# Patient Record
Sex: Male | Born: 1940
Health system: Southern US, Community
[De-identification: ages and names within clinical notes are randomized; demographics above are authoritative.]

## PROBLEM LIST (undated history)

## (undated) ENCOUNTER — Ambulatory Visit (HOSPITAL_BASED_OUTPATIENT_CLINIC_OR_DEPARTMENT_OTHER): Admission: EM | Payer: Medicare Other | Source: Home / Self Care

## (undated) DIAGNOSIS — I4891 Unspecified atrial fibrillation: Secondary | ICD-10-CM

## (undated) DIAGNOSIS — I10 Essential (primary) hypertension: Secondary | ICD-10-CM

## (undated) DIAGNOSIS — E7800 Pure hypercholesterolemia, unspecified: Secondary | ICD-10-CM

## (undated) DIAGNOSIS — I7101 Dissection of thoracic aorta: Secondary | ICD-10-CM

## (undated) DIAGNOSIS — T148XXA Other injury of unspecified body region, initial encounter: Secondary | ICD-10-CM

## (undated) HISTORY — PX: OTHER SURGICAL HISTORY: SHX169

## (undated) HISTORY — PX: LUMBAR DISC SURGERY: SHX700

## (undated) HISTORY — DX: Unspecified atrial fibrillation: I48.91

## (undated) HISTORY — DX: Essential (primary) hypertension: I10

## (undated) HISTORY — PX: HAND SURGERY: SHX662

## (undated) HISTORY — PX: LASIK: SHX215

## (undated) HISTORY — DX: Pure hypercholesterolemia, unspecified: E78.00

## (undated) HISTORY — PX: BACK SURGERY: SHX140

## (undated) HISTORY — PX: CARDIOVERSION: SHX1299

---

## 1998-09-25 ENCOUNTER — Ambulatory Visit (HOSPITAL_COMMUNITY): Admission: RE | Admit: 1998-09-25 | Discharge: 1998-09-25 | Payer: Self-pay | Admitting: Neurosurgery

## 2003-10-15 ENCOUNTER — Ambulatory Visit (HOSPITAL_COMMUNITY): Admission: RE | Admit: 2003-10-15 | Discharge: 2003-10-15 | Payer: Self-pay | Admitting: Gastroenterology

## 2009-12-24 ENCOUNTER — Ambulatory Visit (HOSPITAL_COMMUNITY): Admission: RE | Admit: 2009-12-24 | Discharge: 2009-12-24 | Payer: Self-pay | Admitting: Cardiovascular Disease

## 2010-02-08 ENCOUNTER — Ambulatory Visit: Payer: Self-pay | Admitting: Cardiovascular Disease

## 2010-02-10 ENCOUNTER — Ambulatory Visit: Payer: Self-pay | Admitting: Cardiovascular Disease

## 2010-02-10 ENCOUNTER — Ambulatory Visit (HOSPITAL_COMMUNITY): Admission: RE | Admit: 2010-02-10 | Discharge: 2010-02-10 | Payer: Self-pay | Admitting: Cardiovascular Disease

## 2010-08-15 ENCOUNTER — Ambulatory Visit (INDEPENDENT_AMBULATORY_CARE_PROVIDER_SITE_OTHER): Payer: MEDICARE | Admitting: Cardiovascular Disease

## 2010-08-15 DIAGNOSIS — I119 Hypertensive heart disease without heart failure: Secondary | ICD-10-CM

## 2010-08-15 DIAGNOSIS — I4891 Unspecified atrial fibrillation: Secondary | ICD-10-CM

## 2010-09-08 ENCOUNTER — Encounter: Payer: Self-pay | Admitting: Cardiovascular Disease

## 2010-09-08 DIAGNOSIS — I4891 Unspecified atrial fibrillation: Secondary | ICD-10-CM | POA: Insufficient documentation

## 2010-09-08 DIAGNOSIS — I10 Essential (primary) hypertension: Secondary | ICD-10-CM | POA: Insufficient documentation

## 2010-09-08 DIAGNOSIS — E7800 Pure hypercholesterolemia, unspecified: Secondary | ICD-10-CM | POA: Insufficient documentation

## 2010-09-11 LAB — BASIC METABOLIC PANEL
BUN: 17 mg/dL (ref 6–23)
CO2: 23 mEq/L (ref 19–32)
Calcium: 9.4 mg/dL (ref 8.4–10.5)
Chloride: 106 mEq/L (ref 96–112)
Creatinine, Ser: 1.19 mg/dL (ref 0.4–1.5)
GFR calc Af Amer: >60 mL/min (ref >60)
GFR calc non Af Amer: >60 mL/min (ref >60)
Glucose, Bld: 91 mg/dL (ref 70–99)
Potassium: 4.4 mEq/L (ref 3.5–5.1)
Sodium: 137 mEq/L (ref 135–145)

## 2010-09-11 LAB — CBC
HCT: 47.3 % (ref 39.0–52.0)
Hemoglobin: 16.5 g/dL (ref 13.0–17.0)
MCH: 30.3 pg (ref 26.0–34.0)
MCHC: 34.8 g/dL (ref 30.0–36.0)
MCV: 86.9 fL (ref 78.0–100.0)
Platelets: 193 10*3/uL (ref 150–400)
RBC: 5.44 MIL/uL (ref 4.22–5.81)
RDW: 13.5 % (ref 11.5–15.5)
WBC: 4.6 10*3/uL (ref 4.0–10.5)

## 2010-09-15 ENCOUNTER — Ambulatory Visit (INDEPENDENT_AMBULATORY_CARE_PROVIDER_SITE_OTHER): Payer: BC Managed Care – PPO | Admitting: Cardiovascular Disease

## 2010-09-15 ENCOUNTER — Ambulatory Visit: Payer: Self-pay | Admitting: Cardiovascular Disease

## 2010-09-15 ENCOUNTER — Encounter: Payer: Self-pay | Admitting: Cardiovascular Disease

## 2010-09-15 VITALS — BP 126/96 | HR 54 | Wt 194.0 lb

## 2010-09-15 DIAGNOSIS — E7800 Pure hypercholesterolemia, unspecified: Secondary | ICD-10-CM

## 2010-09-15 DIAGNOSIS — I4891 Unspecified atrial fibrillation: Secondary | ICD-10-CM

## 2010-09-15 DIAGNOSIS — I10 Essential (primary) hypertension: Secondary | ICD-10-CM

## 2010-09-15 HISTORY — DX: Unspecified atrial fibrillation: I48.91

## 2010-09-15 HISTORY — DX: Essential (primary) hypertension: I10

## 2010-09-15 NOTE — Assessment & Plan Note (Signed)
His blood pressure remains well controlled. We will continue with our same medications. His diastolic blood pressure is little high. We will have him continue to exercise and to decrease his salt intake. If his diastolic blood pressure remains elevated, we will consider starting him on HCTZ.

## 2010-09-15 NOTE — Assessment & Plan Note (Signed)
His cholesterol levels have been fairly well controlled. We'll check a lipid profile and complete metabolic profile in 6 months at his next visit.

## 2010-09-15 NOTE — Assessment & Plan Note (Signed)
Jerome Sullivan's atrial fibrillation seems to be well-controlled. We have discussed in the past whether or not he would like to be started on antiarrhythmic such as flecainide or perhaps Propafanone. He states that he's doing quite well presently and does not think that he needs to starting of these medications. I completely agree. His rate is well-controlled and he is on Pradaxa.We will continue with his current medications he

## 2010-09-15 NOTE — Progress Notes (Signed)
Subjective: Jerome Sullivan is a middle-aged gentleman with a history of atrial fibrillation and hypertension. He presents today for a followup visit for hisAtrial fibrillation.  He is overall doing quite well. He walks 2 miles a day. Has occasional episodes of dyspnea but overall he thinks he is doing quite well from a cardiac standpoint.He denies any chest pain or shortness breath. He denies any syncope or presyncope.  Current Outpatient Prescriptions  Medication Sig Dispense Refill  . Cholecalciferol (VITAMIN D3) 2000 UNITS TABS Take 2,000 mg by mouth daily.        Marland Kitchen co-enzyme Q-10 30 MG capsule Take 30 mg by mouth 2 (two) times daily.        . dabigatran (PRADAXA) 150 MG CAPS Take 150 mg by mouth every 12 (twelve) hours.        . fenofibrate 160 MG tablet Take 160 mg by mouth daily.        Marland Kitchen FLAXSEED, LINSEED, PO Take 1,000 mg by mouth 4 (four) times a week.        . metoprolol (LOPRESSOR) 50 MG tablet Take 25 mg by mouth 2 (two) times daily.        . simvastatin (ZOCOR) 40 MG tablet Take 40 mg by mouth at bedtime.          No Known Allergies  Past Medical History  Diagnosis Date  . A-fib     failed cardioversion  . Hypercholesteremia   . HTN (hypertension)   . A-fib     failed cardioversion    History  Smoking status  . Former Smoker  . Types: Cigarettes  . Quit date: 06/26/1978  Smokeless tobacco  . Not on file    History  Alcohol Use No    Family History  Problem Relation Age of Onset  . Heart disease Mother   . Heart disease Father   . Hypertension Sister     Review of Systems: The patient denies any heat or cold intolerance.  No weight gain or weight loss.  The patient denies headaches or blurry vision.  There is no cough or sputum production.  The patient denies dizziness.  There is no hematuria or hematochezia.  The patient denies any muscle aches or arthritis.  The patient denies any rash.  The patient denies frequent falling or instability.  There is no history of  depression or anxiety.  All other systems were reviewed and are negative.  Physical Exam: BP 126/96  Pulse 54  Wt 194 lb (87.998 kg) The patient is alert and oriented x 3.  The mood and affect are normal.  The skin is warm and dry.  Color is normal.  The HEENT exam reveals that the sclera are nonicteric.  The mucous membranes are moist.  The carotids are 2+ without bruits.  There is no thyromegaly.  There is no JVD.  The lungs are clear.  The chest wall is non tender.  The heart exam reveals an irregular rate with a normal S1 and S2. His heart rate is slow. There are no murmurs, gallops, or rubs.  The PMI is not displaced.   Abdominal exam reveals good bowel sounds.  There is no guarding or rebound.  There is no hepatosplenomegaly or tenderness.  There are no masses.  Exam of the legs reveal no clubbing, cyanosis, or edema.  The legs are without rashes.  The distal pulses are intact.  Cranial nerves II - XII are intact.  Motor and sensory functions are intact.  The gait is normal.  ECG: Atrial fibrillation with a slow ventricular response Assessment / Plan:

## 2010-11-11 NOTE — Op Note (Signed)
NAME:  Jerome Sullivan, Jerome Sullivan                            ACCOUNT NO.:  [id number]   MEDICAL RECORD NO.:  [id number]                   PATIENT TYPE:  AMB   LOCATION:  ENDO                                 FACILITY:  [facility]   PHYSICIAN:  Graylin Shiver, M.D.                DATE OF BIRTH:  1940/07/19   DATE OF PROCEDURE:  10/15/2003  DATE OF DISCHARGE:                                 OPERATIVE REPORT   PROCEDURE:  Colonoscopy.   INDICATIONS:  Screening.   Informed consent was obtained after explanation of the risks of bleeding,  infection, and perforation.   PREMEDICATION:  Fentanyl 75 mcg IV, Versed 5 mg IV.   PROCEDURE:  With the patient in the left lateral decubitus position, a  rectal exam was performed.  No masses were felt.  The Olympus colonoscope  was inserted into the rectum and advanced around the colon to the cecum.  Cecal landmarks were identified.  The cecal and ascending colon were normal.  The transverse colon normal.  The descending colon, sigmoid, and rectum were  normal.  He tolerated the procedure well without complications.   IMPRESSION:  Normal colonoscopy to the cecum.                                               Graylin Shiver, M.D.    SFG/MEDQ  D:  10/15/2003  T:  10/15/2003  Job:  [id number]   cc:   Dellis Anes. Idell Pickles, M.D.  [address]  [place]  [place] [address]  Fax: [phone]

## 2010-11-16 ENCOUNTER — Telehealth: Payer: Self-pay | Admitting: Cardiovascular Disease

## 2010-11-16 MED ORDER — METOPROLOL TARTRATE 50 MG PO TABS: 25.0000 mg | ORAL_TABLET | Freq: Two times a day (BID) | ORAL | Status: DC

## 2010-11-16 NOTE — Telephone Encounter (Signed)
Pt's wife called She wants refill thru [facility] of metoprolol  Please call

## 2011-04-14 ENCOUNTER — Ambulatory Visit (INDEPENDENT_AMBULATORY_CARE_PROVIDER_SITE_OTHER): Payer: Medicare Other | Admitting: Cardiovascular Disease

## 2011-04-14 ENCOUNTER — Encounter: Payer: Self-pay | Admitting: Cardiovascular Disease

## 2011-04-14 DIAGNOSIS — I4891 Unspecified atrial fibrillation: Secondary | ICD-10-CM

## 2011-04-14 DIAGNOSIS — I10 Essential (primary) hypertension: Secondary | ICD-10-CM

## 2011-04-14 NOTE — Assessment & Plan Note (Signed)
His atrial fibrillation remained well controlled. We will continue with the same medications.

## 2011-04-14 NOTE — Assessment & Plan Note (Signed)
The blood pressure is well controlled. Continue the same medications. He is to continue his exercise.

## 2011-04-14 NOTE — Patient Instructions (Signed)
Your physician wants you to follow-up in: 6 months, You will receive a reminder letter in the mail two months in advance. If you don't receive a letter, please call our office to schedule the follow-up appointment.  Your physician recommends that you continue on your current medications as directed. Please refer to the Current Medication list given to you today.   

## 2011-04-14 NOTE — Progress Notes (Signed)
Jerome Sullivan Date of Birth  1941-02-09 [facility] [address]    [address] [place], [place]  [address] [phone]  Fax  [phone]  History of Present Illness:  70 year old gentleman with a history of atrial fibrillation. He is been well-controlled and he is on Pradaxa. He's not want to have cardioversion and at this point he is doing very well.  Current Outpatient Prescriptions on File Prior to Visit  Medication Sig Dispense Refill  . Cholecalciferol (VITAMIN D3) 2000 UNITS TABS Take 1,000 mg by mouth daily.       . dabigatran (PRADAXA) 150 MG CAPS Take 150 mg by mouth every 12 (twelve) hours.        . fenofibrate 160 MG tablet Take 160 mg by mouth daily.        Marland Kitchen FLAXSEED, LINSEED, PO Take 1,000 mg by mouth 4 (four) times a week.       . metoprolol (LOPRESSOR) 50 MG tablet Take 0.5 tablets (25 mg total) by mouth 2 (two) times daily.  90 tablet  3  . simvastatin (ZOCOR) 40 MG tablet Take 40 mg by mouth at bedtime.          No Known Allergies  Past Medical History  Diagnosis Date  . A-fib     failed cardioversion  . Hypercholesteremia   . HTN (hypertension)     No past surgical history on file.  History  Smoking status  . Former Smoker  . Types: Cigarettes  . Quit date: 06/26/1978  Smokeless tobacco  . Not on file    History  Alcohol Use No    Family History  Problem Relation Age of Onset  . Heart disease Mother   . Heart disease Father   . Hypertension Sister     Reviw of Systems:  Reviewed in the HPI.  All other systems are negative.  Physical Exam: BP 118/72  Pulse 48  Ht 5\' 11"  (1.803 m)  Wt 199 lb 12.8 oz (90.629 kg)  BMI 27.87 kg/m2 The patient is alert and oriented x 3.  The mood and affect are normal.   Skin: warm and dry.  Color is normal.    HEENT:   No JVD, normal carotids  Lungs: clear   Heart: Irregularly irregular,   Abdomen: normal BS  Extremities:  No edema  Neuro:  Non focal     ECG: A-Fib with a slow vent.  Response.  Assessment / Plan:

## 2011-10-12 ENCOUNTER — Encounter: Payer: Self-pay | Admitting: Cardiovascular Disease

## 2011-10-12 ENCOUNTER — Ambulatory Visit (INDEPENDENT_AMBULATORY_CARE_PROVIDER_SITE_OTHER): Payer: Medicare Other | Admitting: Cardiovascular Disease

## 2011-10-12 VITALS — BP 128/91 | HR 80 | Ht 71.0 in | Wt 203.8 lb

## 2011-10-12 DIAGNOSIS — I4891 Unspecified atrial fibrillation: Secondary | ICD-10-CM

## 2011-10-12 DIAGNOSIS — E7800 Pure hypercholesterolemia, unspecified: Secondary | ICD-10-CM

## 2011-10-12 DIAGNOSIS — I10 Essential (primary) hypertension: Secondary | ICD-10-CM

## 2011-10-12 MED ORDER — METOPROLOL TARTRATE 50 MG PO TABS: 50.0000 mg | ORAL_TABLET | Freq: Two times a day (BID) | ORAL | Status: DC

## 2011-10-12 MED ORDER — SIMVASTATIN 40 MG PO TABS: 40.0000 mg | ORAL_TABLET | Freq: Every day | ORAL | Status: DC

## 2011-10-12 MED ORDER — RIVAROXABAN 20 MG PO TABS: 20.0000 mg | ORAL_TABLET | Freq: Every day | ORAL | Status: DC

## 2011-10-12 NOTE — Progress Notes (Signed)
Jerome Sullivan Date of Birth  [birth year] [facility] [address]    [address] [place], [place]  [address] [phone]  Fax  [phone]   Problem list: 1. Atrial fibrillation 2. Hypertension 3. Hyperlipidemia  History of Present Illness:  He is doing well.   complains of some dyspnea with exertion.  He does not have any angina.  Current Outpatient Prescriptions on File Prior to Visit  Medication Sig Dispense Refill  . CALCIUM PO Take 1,200 mg by mouth 2 (two) times daily.        . Cholecalciferol (VITAMIN D3) 2000 UNITS TABS Take 1,000 mg by mouth daily.       . Coenzyme Q10 (CO Q 10 PO) Take 100 mg by mouth 2 (two) times daily.        . dabigatran (PRADAXA) 150 MG CAPS Take 150 mg by mouth every 12 (twelve) hours.        . fenofibrate 160 MG tablet Take 160 mg by mouth daily.        Marland Kitchen FLAXSEED, LINSEED, PO Take 1,000 mg by mouth 4 (four) times a week.       . metoprolol (LOPRESSOR) 50 MG tablet Take 0.5 tablets (25 mg total) by mouth 2 (two) times daily.  90 tablet  3  . simvastatin (ZOCOR) 40 MG tablet Take 40 mg by mouth at bedtime.          No Known Allergies  Past Medical History  Diagnosis Date  . A-fib     failed cardioversion  . Hypercholesteremia   . HTN (hypertension)     No past surgical history on file.  History  Smoking status  . Former Smoker  . Types: Cigarettes  . Quit date: 06/26/1978  Smokeless tobacco  . Not on file    History  Alcohol Use No    Family History  Problem Relation Age of Onset  . Heart disease Mother   . Heart disease Father   . Hypertension Sister     Reviw of Systems:  Reviewed in the HPI.  All other systems are negative.  Physical Exam: BP 128/91  Pulse 80  Ht 5\' 11"  (1.803 m)  Wt 203 lb 12.8 oz (92.443 kg)  BMI 28.42 kg/m2 The patient is alert and oriented x 3.  The mood and affect are normal.   Skin: warm and dry.  Color is normal.    HEENT:   No JVD, normal carotids  Lungs: clear   Heart:  Irregularly irregular,   Abdomen: normal BS  Extremities:  No edema  Neuro:  Non focal     ECG:  Assessment / Plan:

## 2011-10-12 NOTE — Patient Instructions (Signed)
Your physician recommends that you return for a FASTING lipid profile: tomorrow  Your physician recommends that you schedule a follow-up appointment in: 3 months //EKG  Your physician has recommended you make the following change in your medication:   INCREASE METOPROLOL TO 50 MG ONE TABLET TWICE A DAY 12 HOURS APART STOP PRADAXA START XARELTO 20 MG ONCE DAILY

## 2011-10-12 NOTE — Assessment & Plan Note (Addendum)
Jerome Sullivan remains in atrial fibrillation. He's had significant dyspnea on exertion. I cannot tell whether he is deconditioned or perhaps if his heart rate is going up excessively with exercise.  We will increase his metoprolol to 50 mg twice a day. We'll have him exercise on regular basis. I'll see him in 3 months to see if he is making progress. He's continued to have shortness of breath with exercise then we'll consider doing a repeat echocardiogram or even perhaps a stress echo to look at his exertional capacity.  His insurance currently has recommended that we start him on Xarelto 20 mg a day instead of Pradaxa.

## 2011-10-13 ENCOUNTER — Other Ambulatory Visit (INDEPENDENT_AMBULATORY_CARE_PROVIDER_SITE_OTHER): Payer: Medicare Other

## 2011-10-13 DIAGNOSIS — I4891 Unspecified atrial fibrillation: Secondary | ICD-10-CM

## 2011-10-13 DIAGNOSIS — E7800 Pure hypercholesterolemia, unspecified: Secondary | ICD-10-CM

## 2011-10-13 DIAGNOSIS — I10 Essential (primary) hypertension: Secondary | ICD-10-CM

## 2011-10-13 LAB — BASIC METABOLIC PANEL
BUN: 15 mg/dL (ref 6–23)
Calcium: 9.2 mg/dL (ref 8.4–10.5)
Creatinine, Ser: 1.3 mg/dL (ref 0.4–1.5)
GFR: 60.34 mL/min (ref >60.00)

## 2011-10-13 LAB — HEPATIC FUNCTION PANEL
ALT: 19 U/L (ref 0–53)
AST: 28 U/L (ref 0–37)
Alkaline Phosphatase: 26 U/L — ABNORMAL LOW (ref 39–117)
Bilirubin, Direct: 0.2 mg/dL (ref 0.0–0.3)
Total Protein: 7 g/dL (ref 6.0–8.3)

## 2011-10-13 LAB — LIPID PANEL
Cholesterol: 121 mg/dL (ref 0–200)
Triglycerides: 70 mg/dL (ref 0.0–149.0)

## 2011-10-16 ENCOUNTER — Other Ambulatory Visit: Payer: Self-pay | Admitting: Cardiovascular Disease

## 2011-10-16 DIAGNOSIS — I10 Essential (primary) hypertension: Secondary | ICD-10-CM

## 2011-10-16 DIAGNOSIS — I4891 Unspecified atrial fibrillation: Secondary | ICD-10-CM

## 2011-10-16 DIAGNOSIS — E7800 Pure hypercholesterolemia, unspecified: Secondary | ICD-10-CM

## 2011-10-16 MED ORDER — METOPROLOL TARTRATE 50 MG PO TABS: 50.0000 mg | ORAL_TABLET | Freq: Two times a day (BID) | ORAL | Status: DC

## 2011-10-23 ENCOUNTER — Other Ambulatory Visit: Payer: Self-pay

## 2011-10-23 ENCOUNTER — Other Ambulatory Visit: Payer: Self-pay | Admitting: Cardiovascular Disease

## 2011-10-23 DIAGNOSIS — I10 Essential (primary) hypertension: Secondary | ICD-10-CM

## 2011-10-23 DIAGNOSIS — I4891 Unspecified atrial fibrillation: Secondary | ICD-10-CM

## 2011-10-23 DIAGNOSIS — E7800 Pure hypercholesterolemia, unspecified: Secondary | ICD-10-CM

## 2011-10-23 MED ORDER — RIVAROXABAN 20 MG PO TABS: 20.0000 mg | ORAL_TABLET | Freq: Every day | ORAL | Status: DC

## 2011-10-23 MED ORDER — METOPROLOL TARTRATE 50 MG PO TABS: 50.0000 mg | ORAL_TABLET | Freq: Two times a day (BID) | ORAL | Status: DC

## 2011-10-25 ENCOUNTER — Encounter: Payer: Self-pay | Admitting: *Deleted

## 2012-01-22 ENCOUNTER — Encounter: Payer: Self-pay | Admitting: Cardiovascular Disease

## 2012-01-22 ENCOUNTER — Ambulatory Visit (INDEPENDENT_AMBULATORY_CARE_PROVIDER_SITE_OTHER): Payer: Medicare Other | Admitting: Cardiovascular Disease

## 2012-01-22 VITALS — BP 125/91 | HR 66 | Ht 71.0 in | Wt 200.1 lb

## 2012-01-22 DIAGNOSIS — I10 Essential (primary) hypertension: Secondary | ICD-10-CM

## 2012-01-22 DIAGNOSIS — I4891 Unspecified atrial fibrillation: Secondary | ICD-10-CM

## 2012-01-22 DIAGNOSIS — E7800 Pure hypercholesterolemia, unspecified: Secondary | ICD-10-CM

## 2012-01-22 NOTE — Progress Notes (Signed)
Jerome Sullivan Date of Birth  [birth year] [facility] [address]    [address] [place], [place]  [address] [phone]  Fax  [phone]  Problem list: 1. Atrial fibrillation 2. Hypertension 3. Hyperlipidemia  History of Present Illness:   Jerome Sullivan is doing well.  We tried Metoprolol 50 BID but this caused significant dyspnea.  He reduced his dose back down to 25 BID and now feels well.  Current Outpatient Prescriptions on File Prior to Visit  Medication Sig Dispense Refill  . CALCIUM PO Take 1,200 mg by mouth 2 (two) times daily.        . Cholecalciferol (VITAMIN D3) 2000 UNITS TABS Take 1,000 mg by mouth daily.       . Coenzyme Q10 (CO Q 10 PO) Take 100 mg by mouth 2 (two) times daily.        . fenofibrate 160 MG tablet Take 160 mg by mouth daily.        Marland Kitchen FLAXSEED, LINSEED, PO Take 1,000 mg by mouth 4 (four) times a week.       . Rivaroxaban (XARELTO) 20 MG TABS Take 20 mg by mouth daily.  90 tablet  1  . simvastatin (ZOCOR) 40 MG tablet Take 1 tablet (40 mg total) by mouth at bedtime.  90 tablet  3  . DISCONTD: metoprolol (LOPRESSOR) 50 MG tablet Take 1 tablet (50 mg total) by mouth 2 (two) times daily.  180 tablet  3    No Known Allergies  Past Medical History  Diagnosis Date  . A-fib     failed cardioversion  . Hypercholesteremia   . HTN (hypertension)     No past surgical history on file.  History  Smoking status  . Former Smoker  . Types: Cigarettes  . Quit date: 06/26/1978  Smokeless tobacco  . Not on file    History  Alcohol Use No    Family History  Problem Relation Age of Onset  . Heart disease Mother   . Heart disease Father   . Hypertension Sister     Reviw of Systems:  Reviewed in the HPI.  All other systems are negative.  Physical Exam: BP 125/91  Pulse 66  Ht 5\' 11"  (1.803 m)  Wt 200 lb 1.9 oz (90.774 kg)  BMI 27.91 kg/m2 The patient is alert and oriented x 3.  The mood and affect are normal.   Skin: warm and dry.  Color is  normal.    HEENT:   No JVD, normal carotids  Lungs: clear   Heart: Irregularly irregular,   Abdomen: normal BS  Extremities:  No edema  Neuro:  Non focal     ECG: 01/22/2012 A-Fib with controlled v response .    Assessment / Plan:

## 2012-01-22 NOTE — Assessment & Plan Note (Signed)
We'll check fasting labs on him in 6 months.

## 2012-01-22 NOTE — Assessment & Plan Note (Addendum)
Jerome Sullivan's diastolic blood pressure is mildly elevated today. He admits to eating some salt recently. I have asked him to hold back on eating any extra salt.

## 2012-01-22 NOTE — Assessment & Plan Note (Signed)
Jerome Sullivan is doing well. We'll continue the same medications. He's on metoprolol and Xarelto. 6 pounds. We'll check fasting labs at that time.

## 2012-01-22 NOTE — Patient Instructions (Addendum)
Your physician wants you to follow-up in: 6 months  You will receive a reminder letter in the mail two months in advance. If you don't receive a letter, please call our office to schedule the follow-up appointment.  Your physician recommends that you return for a FASTING lipid profile: 6 months   

## 2012-04-22 ENCOUNTER — Other Ambulatory Visit: Payer: Self-pay | Admitting: Cardiovascular Disease

## 2012-04-22 DIAGNOSIS — I10 Essential (primary) hypertension: Secondary | ICD-10-CM

## 2012-04-22 DIAGNOSIS — E7800 Pure hypercholesterolemia, unspecified: Secondary | ICD-10-CM

## 2012-04-22 DIAGNOSIS — I4891 Unspecified atrial fibrillation: Secondary | ICD-10-CM

## 2012-04-22 MED ORDER — RIVAROXABAN 20 MG PO TABS: 20.0000 mg | ORAL_TABLET | Freq: Every day | ORAL | Status: DC

## 2012-04-22 NOTE — Telephone Encounter (Signed)
Fax Received. Refill Completed. Jerome Sullivan (R.M.A)   

## 2012-09-24 ENCOUNTER — Encounter: Payer: Self-pay | Admitting: Cardiovascular Disease

## 2012-09-24 ENCOUNTER — Ambulatory Visit (INDEPENDENT_AMBULATORY_CARE_PROVIDER_SITE_OTHER): Payer: Medicare Other | Admitting: Cardiovascular Disease

## 2012-09-24 VITALS — BP 126/92 | HR 57 | Ht 71.0 in | Wt 206.0 lb

## 2012-09-24 DIAGNOSIS — I10 Essential (primary) hypertension: Secondary | ICD-10-CM

## 2012-09-24 DIAGNOSIS — I4891 Unspecified atrial fibrillation: Secondary | ICD-10-CM

## 2012-09-24 NOTE — Assessment & Plan Note (Addendum)
His atrial fibrillation is well controlled. His rate is good. We'll continue the same medications.   His CHADS score is 1.  Continue Xarelto for now

## 2012-09-24 NOTE — Progress Notes (Signed)
Jerome Sullivan Date of Birth  Apr 24, 1941 [facility] [address]    [address] [place], [place]  [address] [phone]  Fax  [phone]  Problem list: 1. Atrial fibrillation 2. Hypertension 3. Hyperlipidemia  History of Present Illness:   Jerome Sullivan is doing well.  We tried Metoprolol 50 BID but this caused significant dyspnea.  He reduced his dose back down to 25 BID and now feels well.  September 24, 2012:  Jerome Sullivan is doing well.  No problems.   He has some dyspne .  He works in the garden. - does not walk regularly.  He is still eating salt / sea salt.  Current Outpatient Prescriptions on File Prior to Visit  Medication Sig Dispense Refill  . CALCIUM PO Take 1,200 mg by mouth 2 (two) times daily.        . Cholecalciferol (VITAMIN D3) 2000 UNITS TABS Take 1,000 mg by mouth daily.       . Coenzyme Q10 (CO Q 10 PO) Take 100 mg by mouth 2 (two) times daily.        . fenofibrate 160 MG tablet Take 160 mg by mouth daily.        Marland Kitchen FLAXSEED, LINSEED, PO Take 1,000 mg by mouth 4 (four) times a week.       . metoprolol (LOPRESSOR) 50 MG tablet Take 25 mg by mouth 2 (two) times daily.      . Rivaroxaban (XARELTO) 20 MG TABS Take 1 tablet (20 mg total) by mouth daily.  90 tablet  2  . simvastatin (ZOCOR) 40 MG tablet Take 1 tablet (40 mg total) by mouth at bedtime.  90 tablet  3   No current facility-administered medications on file prior to visit.    No Known Allergies  Past Medical History  Diagnosis Date  . A-fib     failed cardioversion  . Hypercholesteremia   . HTN (hypertension)     No past surgical history on file.  History  Smoking status  . Former Smoker  . Types: Cigarettes  . Quit date: 06/26/1978  Smokeless tobacco  . Not on file    History  Alcohol Use No    Family History  Problem Relation Age of Onset  . Heart disease Mother   . Heart disease Father   . Hypertension Sister     Reviw of Systems:  Reviewed in the HPI.  All other systems are  negative.  Physical Exam: BP 126/92  Pulse 57  Ht 5\' 11"  (1.803 m)  Wt 206 lb (93.441 kg)  BMI 28.74 kg/m2  SpO2 97% The patient is alert and oriented x 3.  The mood and affect are normal.   Skin: warm and dry.  Color is normal.    HEENT:   No JVD, normal carotids  Lungs: clear   Heart: Irregularly irregular,   Abdomen: normal BS  Extremities:  No edema  Neuro:  Non focal     ECG: 09/24/2012 A-Fib with controlled v response .    Assessment / Plan:

## 2012-09-24 NOTE — Assessment & Plan Note (Signed)
He still eats a fair amount of salt.  I have asked him to limit his salt intake.

## 2012-09-24 NOTE — Patient Instructions (Addendum)
Your physician wants you to follow-up in: 6 MONTHS WITH EKG  You will receive a reminder letter in the mail two months in advance. If you don't receive a letter, please call our office to schedule the follow-up appointment.  Your physician recommends that you continue on your current medications as directed. Please refer to the Current Medication list given to you today.  Your physician recommends that you return for a FASTING lipid profile: 6 MONTHS

## 2013-01-27 ENCOUNTER — Other Ambulatory Visit: Payer: Self-pay | Admitting: *Deleted

## 2013-01-27 ENCOUNTER — Encounter: Payer: Self-pay | Admitting: Cardiovascular Disease

## 2013-01-27 DIAGNOSIS — I10 Essential (primary) hypertension: Secondary | ICD-10-CM

## 2013-01-27 DIAGNOSIS — E7800 Pure hypercholesterolemia, unspecified: Secondary | ICD-10-CM

## 2013-01-27 DIAGNOSIS — I4891 Unspecified atrial fibrillation: Secondary | ICD-10-CM

## 2013-01-27 MED ORDER — METOPROLOL TARTRATE 50 MG PO TABS: 25.0000 mg | ORAL_TABLET | Freq: Two times a day (BID) | ORAL | Status: DC

## 2013-01-27 MED ORDER — RIVAROXABAN 20 MG PO TABS: 20.0000 mg | ORAL_TABLET | Freq: Every day | ORAL | Status: DC

## 2013-02-26 ENCOUNTER — Encounter: Payer: Self-pay | Admitting: Cardiovascular Disease

## 2013-03-26 ENCOUNTER — Ambulatory Visit: Payer: Medicare Other | Admitting: Cardiovascular Disease

## 2013-05-16 ENCOUNTER — Encounter: Payer: Self-pay | Admitting: Cardiovascular Disease

## 2013-05-16 ENCOUNTER — Encounter (INDEPENDENT_AMBULATORY_CARE_PROVIDER_SITE_OTHER): Payer: Self-pay

## 2013-05-16 ENCOUNTER — Ambulatory Visit (INDEPENDENT_AMBULATORY_CARE_PROVIDER_SITE_OTHER): Payer: Medicare Other | Admitting: Cardiovascular Disease

## 2013-05-16 VITALS — BP 140/90 | HR 77 | Wt 203.0 lb

## 2013-05-16 DIAGNOSIS — I10 Essential (primary) hypertension: Secondary | ICD-10-CM

## 2013-05-16 DIAGNOSIS — I4891 Unspecified atrial fibrillation: Secondary | ICD-10-CM

## 2013-05-16 DIAGNOSIS — E7800 Pure hypercholesterolemia, unspecified: Secondary | ICD-10-CM

## 2013-05-16 LAB — BASIC METABOLIC PANEL
Chloride: 104 mEq/L (ref 96–112)
GFR: 62.34 mL/min (ref >60.00)
Glucose, Bld: 82 mg/dL (ref 70–99)
Potassium: 4.2 mEq/L (ref 3.5–5.1)
Sodium: 138 mEq/L (ref 135–145)

## 2013-05-16 LAB — HEPATIC FUNCTION PANEL
ALT: 19 U/L (ref 0–53)
Albumin: 3.9 g/dL (ref 3.5–5.2)
Alkaline Phosphatase: 28 U/L — ABNORMAL LOW (ref 39–117)
Bilirubin, Direct: 0.1 mg/dL (ref 0.0–0.3)
Total Protein: 6.7 g/dL (ref 6.0–8.3)

## 2013-05-16 LAB — LIPID PANEL
Cholesterol: 132 mg/dL (ref 0–200)
LDL Cholesterol: 81 mg/dL (ref 0–99)
Triglycerides: 94 mg/dL (ref 0.0–149.0)
VLDL: 18.8 mg/dL (ref 0.0–40.0)

## 2013-05-16 MED ORDER — SIMVASTATIN 40 MG PO TABS: 40.0000 mg | ORAL_TABLET | Freq: Every day | ORAL | Status: DC

## 2013-05-16 MED ORDER — METOPROLOL TARTRATE 50 MG PO TABS: 25.0000 mg | ORAL_TABLET | Freq: Two times a day (BID) | ORAL | Status: DC

## 2013-05-16 MED ORDER — RIVAROXABAN 20 MG PO TABS: 20.0000 mg | ORAL_TABLET | Freq: Every day | ORAL | Status: DC

## 2013-05-16 NOTE — Assessment & Plan Note (Signed)
His blood pressure remains mildly elevated.  i've advised him to avoid any extra salt.  We'll see him again in one year. We'll check fasting labs today and again in one year.

## 2013-05-16 NOTE — Patient Instructions (Signed)
Your physician recommends that you return for a FASTING lipid profile: TODAY AND IN 1 YEAR   Your physician wants you to follow-up in: 1 YEAR  You will receive a reminder letter in the mail two months in advance. If you don't receive a letter, please call our office to schedule the follow-up appointment.  Your physician recommends that you continue on your current medications as directed. Please refer to the Current Medication list given to you today.    

## 2013-05-16 NOTE — Progress Notes (Signed)
Jerome Sullivan Date of Birth  [birth year] [facility] [address]    [address] [place], [place]  [address] [phone]  Fax  [phone]  Problem list: 1. Atrial fibrillation 2. Hypertension 3. Hyperlipidemia  History of Present Illness:   Jerome Sullivan is doing well.  We tried Metoprolol 50 BID but this caused significant dyspnea.  Jerome Sullivan reduced his dose back down to 25 BID and now feels well.  September 24, 2012:  Jerome Sullivan is doing well.  No problems.   Jerome Sullivan has some dyspne .  Jerome Sullivan works in the garden. - does not walk regularly.  Jerome Sullivan is still eating salt / sea salt.  Nov. 21, 2014:  Jerome Sullivan is doing ok.  No CP , no dypsnea.  Not walking regularly.  Trying to avoid some salt.   Has GERD symptoms - especially when Jerome Sullivan lies down.    Current Outpatient Prescriptions on File Prior to Visit  Medication Sig Dispense Refill  . CALCIUM PO Take 1,200 mg by mouth 2 (two) times daily.        . Cholecalciferol (VITAMIN D3) 2000 UNITS TABS Take 1,000 mg by mouth daily.       . Coenzyme Q10 (CO Q 10 PO) Take 100 mg by mouth 2 (two) times daily.        . fenofibrate 160 MG tablet Take 160 mg by mouth daily.        Marland Kitchen FLAXSEED, LINSEED, PO Take 1,000 mg by mouth 4 (four) times a week.       . metoprolol (LOPRESSOR) 50 MG tablet Take 0.5 tablets (25 mg total) by mouth 2 (two) times daily.  90 tablet  3  . Rivaroxaban (XARELTO) 20 MG TABS tablet Take 1 tablet (20 mg total) by mouth daily.  90 tablet  3  . simvastatin (ZOCOR) 40 MG tablet Take 1 tablet (40 mg total) by mouth at bedtime.  90 tablet  3   No current facility-administered medications on file prior to visit.    No Known Allergies  Past Medical History  Diagnosis Date  . A-fib     failed cardioversion  . Hypercholesteremia   . HTN (hypertension)     No past surgical history on file.  History  Smoking status  . Former Smoker  . Types: Cigarettes  . Quit date: 06/26/1978  Smokeless tobacco  . Not on file    History  Alcohol Use No     Family History  Problem Relation Age of Onset  . Heart disease Mother   . Heart disease Father   . Hypertension Sister     Reviw of Systems:  Reviewed in the HPI.  All other systems are negative.  Physical Exam: BP 140/90  Pulse 77  Wt 203 lb (92.08 kg) The patient is alert and oriented x 3.  The mood and affect are normal.   Skin: warm and dry.  Color is normal.    HEENT:   No JVD, normal carotids  Lungs: clear   Heart: Irregularly irregular,  Rate is well controlled.   Abdomen: normal BS  Extremities:  No edema   Neuro:  Non focal     ECG: 05/16/2013:  Atrial fib at rate of 77.     Assessment / Plan:

## 2013-05-16 NOTE — Assessment & Plan Note (Signed)
His heart rate are stable. Continue with the current dose of metoprolol and Xarelto.

## 2013-05-19 NOTE — Progress Notes (Signed)
Quick Note:  Patient notified of lab results. Stated they would also view them on MyChart. Verbalized agreement with current treatment plan. ______

## 2014-02-24 ENCOUNTER — Other Ambulatory Visit: Payer: Self-pay | Admitting: *Deleted

## 2014-02-24 DIAGNOSIS — E7800 Pure hypercholesterolemia, unspecified: Secondary | ICD-10-CM

## 2014-02-24 MED ORDER — METOPROLOL TARTRATE 50 MG PO TABS: 25.0000 mg | ORAL_TABLET | Freq: Two times a day (BID) | ORAL | Status: DC

## 2014-04-07 ENCOUNTER — Other Ambulatory Visit: Payer: Self-pay | Admitting: *Deleted

## 2014-04-07 DIAGNOSIS — E7800 Pure hypercholesterolemia, unspecified: Secondary | ICD-10-CM

## 2014-04-07 MED ORDER — RIVAROXABAN 20 MG PO TABS: 20.0000 mg | ORAL_TABLET | Freq: Every day | ORAL | Status: DC

## 2014-04-29 ENCOUNTER — Other Ambulatory Visit: Payer: Self-pay | Admitting: *Deleted

## 2014-04-29 DIAGNOSIS — E7800 Pure hypercholesterolemia, unspecified: Secondary | ICD-10-CM

## 2014-04-29 DIAGNOSIS — Z79899 Other long term (current) drug therapy: Secondary | ICD-10-CM

## 2014-04-30 ENCOUNTER — Encounter: Payer: Self-pay | Admitting: Cardiovascular Disease

## 2014-04-30 ENCOUNTER — Other Ambulatory Visit (INDEPENDENT_AMBULATORY_CARE_PROVIDER_SITE_OTHER): Payer: Medicare Other | Admitting: *Deleted

## 2014-04-30 ENCOUNTER — Ambulatory Visit (INDEPENDENT_AMBULATORY_CARE_PROVIDER_SITE_OTHER): Payer: Medicare Other | Admitting: Cardiovascular Disease

## 2014-04-30 VITALS — BP 140/100 | HR 61 | Ht 71.0 in | Wt 208.8 lb

## 2014-04-30 DIAGNOSIS — E7800 Pure hypercholesterolemia, unspecified: Secondary | ICD-10-CM

## 2014-04-30 DIAGNOSIS — I10 Essential (primary) hypertension: Secondary | ICD-10-CM

## 2014-04-30 DIAGNOSIS — Z79899 Other long term (current) drug therapy: Secondary | ICD-10-CM

## 2014-04-30 DIAGNOSIS — I482 Chronic atrial fibrillation, unspecified: Secondary | ICD-10-CM

## 2014-04-30 LAB — BASIC METABOLIC PANEL
BUN: 14 mg/dL (ref 6–23)
CO2: 30 mEq/L (ref 19–32)
Calcium: 9.5 mg/dL (ref 8.4–10.5)
Chloride: 103 mEq/L (ref 96–112)
Creatinine, Ser: 1.1 mg/dL (ref 0.4–1.5)
GFR: 68.62 mL/min (ref >60.00)
Glucose, Bld: 86 mg/dL (ref 70–99)
POTASSIUM: 4.2 meq/L (ref 3.5–5.1)
SODIUM: 140 meq/L (ref 135–145)

## 2014-04-30 LAB — CBC WITH DIFFERENTIAL/PLATELET
BASOS ABS: 0 10*3/uL (ref 0.0–0.1)
BASOS PCT: 0.5 % (ref 0.0–3.0)
EOS ABS: 0.4 10*3/uL (ref 0.0–0.7)
Eosinophils Relative: 6.4 % — ABNORMAL HIGH (ref 0.0–5.0)
HCT: 49.2 % (ref 39.0–52.0)
HEMOGLOBIN: 16.5 g/dL (ref 13.0–17.0)
LYMPHS ABS: 1.5 10*3/uL (ref 0.7–4.0)
LYMPHS PCT: 23.6 % (ref 12.0–46.0)
MCHC: 33.5 g/dL (ref 30.0–36.0)
MCV: 83.4 fl (ref 78.0–100.0)
MONO ABS: 0.6 10*3/uL (ref 0.1–1.0)
Monocytes Relative: 9 % (ref 3.0–12.0)
NEUTROS ABS: 3.8 10*3/uL (ref 1.4–7.7)
Neutrophils Relative %: 60.5 % (ref 43.0–77.0)
Platelets: 193 10*3/uL (ref 150.0–400.0)
RBC: 5.91 Mil/uL — AB (ref 4.22–5.81)
RDW: 14.1 % (ref 11.5–15.5)
WBC: 6.3 10*3/uL (ref 4.0–10.5)

## 2014-04-30 LAB — LIPID PANEL
CHOLESTEROL: 134 mg/dL (ref 0–200)
HDL: 25 mg/dL — ABNORMAL LOW (ref >39.00)
LDL CALC: 79 mg/dL (ref 0–99)
NonHDL: 109
TRIGLYCERIDES: 151 mg/dL — AB (ref 0.0–149.0)
Total CHOL/HDL Ratio: 5
VLDL: 30.2 mg/dL (ref 0.0–40.0)

## 2014-04-30 LAB — HEPATIC FUNCTION PANEL
ALBUMIN: 3.3 g/dL — AB (ref 3.5–5.2)
ALT: 18 U/L (ref 0–53)
AST: 26 U/L (ref 0–37)
Alkaline Phosphatase: 39 U/L (ref 39–117)
BILIRUBIN TOTAL: 0.8 mg/dL (ref 0.2–1.2)
Bilirubin, Direct: 0.1 mg/dL (ref 0.0–0.3)
TOTAL PROTEIN: 7.1 g/dL (ref 6.0–8.3)

## 2014-04-30 MED ORDER — POTASSIUM CHLORIDE ER 10 MEQ PO TBCR: 10.0000 meq | EXTENDED_RELEASE_TABLET | Freq: Every day | ORAL | Status: DC

## 2014-04-30 MED ORDER — HYDROCHLOROTHIAZIDE 25 MG PO TABS: 25.0000 mg | ORAL_TABLET | Freq: Every day | ORAL | Status: DC

## 2014-04-30 NOTE — Assessment & Plan Note (Signed)
Diastolic BP is up. Still eats salt - doe not exercise regularly Will add HCTZ 25 a day and Kdur 10 a day reckeck in the office in 3 months with BMP

## 2014-04-30 NOTE — Progress Notes (Signed)
Jerome Sullivan Date of Birth  October 04, 1940 Jerome Sullivan [address]    [place], [place]  [address] [phone]  Fax  [phone]  Problem list: 1. Atrial fibrillation 2. Hypertension 3. Hyperlipidemia  History of Present Illness:   Jerome Sullivan is doing well.  We tried Metoprolol 50 BID but this caused significant dyspnea.  He reduced his dose back down to 25 BID and now feels well.  September 24, 2012:  Jerome Sullivan is doing well.  No problems.   He has some dyspne .  He works in the garden. - does not walk regularly.  He is still eating salt / sea salt.  Nov. 21, 2014:  Jerome Sullivan is doing ok.  No CP , no dypsnea.  Not walking regularly.  Trying to avoid some salt.   Has GERD symptoms - especially when he lies down.    Nov. 5, 2015:  Jerome Sullivan is doing ok.  Still eating some salt.     Current Outpatient Prescriptions on File Prior to Visit  Medication Sig Dispense Refill  . CALCIUM PO Take 1,200 mg by mouth 2 (two) times daily.      . Cholecalciferol (VITAMIN D3) 2000 UNITS TABS Take 1,000 mg by mouth daily.     . Coenzyme Q10 (CO Q 10 PO) Take 100 mg by mouth 2 (two) times daily.      . metoprolol (LOPRESSOR) 50 MG tablet Take 0.5 tablets (25 mg total) by mouth 2 (two) times daily. 90 tablet 0  . rivaroxaban (XARELTO) 20 MG TABS tablet Take 1 tablet (20 mg total) by mouth daily. 90 tablet 3  . simvastatin (ZOCOR) 40 MG tablet Take 1 tablet (40 mg total) by mouth at bedtime. 90 tablet 3   No current facility-administered medications on file prior to visit.    No Known Allergies  Past Medical History  Diagnosis Date  . A-fib     failed cardioversion  . Hypercholesteremia   . HTN (hypertension)     No past surgical history on file.  History  Smoking status  . Former Smoker  . Types: Cigarettes  . Quit date: 06/26/1978  Smokeless tobacco  . Not on file    History  Alcohol Use No    Family History  Problem Relation Age of Onset  . Heart disease Mother   . Heart  disease Father   . Hypertension Sister     Reviw of Systems:  Reviewed in the HPI.  All other systems are negative.  Physical Exam: BP 140/100 mmHg  Pulse 61  Ht 5\' 11"  (1.803 m)  Wt 208 lb 12.8 oz (94.711 kg)  BMI 29.13 kg/m2 The patient is alert and oriented x 3.  The mood and affect are normal.   Skin: warm and dry.  Color is normal.    HEENT:   No JVD, normal carotids  Lungs: clear   Heart: Irregularly irregular,  Rate is well controlled.   Abdomen: normal BS  Extremities:  No edema   Neuro:  Non focal     ECG: 05/16/2013:  Atrial fib at rate of 77.     Assessment / Plan:

## 2014-04-30 NOTE — Patient Instructions (Signed)
Your physician has recommended you make the following change in your medication:  START HCTZ (Hydrochlorothiazide) 25 mg once daily START KDur (potassium supplement) 10 meq once daily  Your physician recommends that you schedule a follow-up appointment in: 3 months with Dr. Acie Fredrickson  Your physician recommends that you return for lab work in: 3 months on the same day as your office visit with Dr. Acie Fredrickson (basic metabolic panel)

## 2014-04-30 NOTE — Assessment & Plan Note (Signed)
He remains in A-fib. Needs to exercise regularly. Encourage him to work on diet and exercise program.

## 2014-05-20 ENCOUNTER — Other Ambulatory Visit: Payer: Self-pay | Admitting: Cardiovascular Disease

## 2014-06-30 ENCOUNTER — Other Ambulatory Visit: Payer: Self-pay | Admitting: *Deleted

## 2014-06-30 DIAGNOSIS — E7800 Pure hypercholesterolemia, unspecified: Secondary | ICD-10-CM

## 2014-06-30 MED ORDER — RIVAROXABAN 20 MG PO TABS: 20.0000 mg | ORAL_TABLET | Freq: Every day | ORAL | Status: DC

## 2014-06-30 NOTE — Telephone Encounter (Signed)
Samples of Xarelto 20mg  left at desk for patient.

## 2014-07-02 ENCOUNTER — Other Ambulatory Visit: Payer: Self-pay | Admitting: Cardiovascular Disease

## 2014-07-02 ENCOUNTER — Other Ambulatory Visit: Payer: Self-pay

## 2014-08-04 ENCOUNTER — Other Ambulatory Visit (INDEPENDENT_AMBULATORY_CARE_PROVIDER_SITE_OTHER): Payer: Medicare Other | Admitting: *Deleted

## 2014-08-04 ENCOUNTER — Encounter: Payer: Self-pay | Admitting: Cardiovascular Disease

## 2014-08-04 ENCOUNTER — Other Ambulatory Visit: Payer: Self-pay | Admitting: Nurse Practitioner

## 2014-08-04 ENCOUNTER — Ambulatory Visit (INDEPENDENT_AMBULATORY_CARE_PROVIDER_SITE_OTHER): Payer: Medicare Other | Admitting: Cardiovascular Disease

## 2014-08-04 VITALS — BP 114/90 | HR 72 | Ht 71.0 in | Wt 210.6 lb

## 2014-08-04 DIAGNOSIS — I10 Essential (primary) hypertension: Secondary | ICD-10-CM

## 2014-08-04 DIAGNOSIS — I482 Chronic atrial fibrillation, unspecified: Secondary | ICD-10-CM

## 2014-08-04 DIAGNOSIS — E7800 Pure hypercholesterolemia, unspecified: Secondary | ICD-10-CM

## 2014-08-04 LAB — BASIC METABOLIC PANEL
BUN: 22 mg/dL (ref 6–23)
CHLORIDE: 102 meq/L (ref 96–112)
CO2: 32 meq/L (ref 19–32)
Calcium: 9.5 mg/dL (ref 8.4–10.5)
Creatinine, Ser: 1.12 mg/dL (ref 0.40–1.50)
GFR: 68.57 mL/min (ref >60.00)
GLUCOSE: 94 mg/dL (ref 70–99)
POTASSIUM: 3.5 meq/L (ref 3.5–5.1)
SODIUM: 140 meq/L (ref 135–145)

## 2014-08-04 MED ORDER — POTASSIUM CHLORIDE CRYS ER 20 MEQ PO TBCR: 20.0000 meq | EXTENDED_RELEASE_TABLET | Freq: Every day | ORAL | Status: DC

## 2014-08-04 NOTE — Patient Instructions (Signed)
Your physician recommends that you continue on your current medications as directed. Please refer to the Current Medication list given to you today.  Your physician wants you to follow-up in: 6 months with Dr. Nahser.  You will receive a reminder letter in the mail two months in advance. If you don't receive a letter, please call our office to schedule the follow-up appointment.  

## 2014-08-04 NOTE — Progress Notes (Signed)
Cardiology Office Note   Date:  08/04/2014   ID:  DRAEDYN WEIDINGER, DOB 12-May-1941, MRN [id number]  PCP:  Marcello Fennel, MD  Cardiologist:   Thayer Headings, MD   Chief Complaint  Patient presents with  . Atrial Fibrillation   Problem list: 1. Atrial fibrillation 2. Hypertension 3. Hyperlipidemia  History of Present Illness:  Lynford is doing well. We tried Metoprolol 50 BID but this caused significant dyspnea. He reduced his dose back down to 25 BID and now feels well.  September 24, 2012: Kaipo is doing well. No problems. He has some dyspne . He works in the garden. - does not walk regularly. He is still eating salt / sea salt.  Nov. 21, 2014: Demerius is doing ok. No CP , no dypsnea. Not walking regularly. Trying to avoid some salt. Has GERD symptoms - especially when he lies down.   Nov. 5, 2015: Johnny is doing ok. Still eating some salt  Feb. 9, 2016: RAYMON SCHLARB is a 74 y.o. male who presents for follow up of his Afib and HTN Karrington is doing ok.  Went rabbit hunting 2 days ago.  No CP or dypsnea with walking.  Just some leg cramping afterwards.     Past Medical History  Diagnosis Date  . A-fib     failed cardioversion  . Hypercholesteremia   . HTN (hypertension)     No past surgical history on file.   Current Outpatient Prescriptions  Medication Sig Dispense Refill  . CALCIUM PO Take 1,200 mg by mouth 2 (two) times daily.      . Cholecalciferol (VITAMIN D3) 2000 UNITS TABS Take 1,000 mg by mouth daily.     . Coenzyme Q10 (CO Q 10 PO) Take 100 mg by mouth 2 (two) times daily.      . hydrochlorothiazide (HYDRODIURIL) 25 MG tablet TAKE 1 TABLET BY MOUTH ONCE DAILY 30 tablet 3  . metoprolol (LOPRESSOR) 50 MG tablet TAKE 1/2 BY MOUTH TWICE DAILY 90 tablet 0  . potassium chloride (K-DUR,KLOR-CON) 10 MEQ tablet TAKE 1 TABLET BY MOUTH ONCE DAILY 30 tablet 3  . rivaroxaban (XARELTO) 20 MG TABS tablet Take 1 tablet (20 mg total) by mouth daily. 90 tablet 1  .  simvastatin (ZOCOR) 40 MG tablet TAKE 1 BY MOUTH AT BEDTIME 90 tablet 0   No current facility-administered medications for this visit.    Allergies:   Review of patient's allergies indicates no known allergies.    Social History:  The patient  reports that he quit smoking about 36 years ago. His smoking use included Cigarettes. He does not have any smokeless tobacco history on file. He reports that he does not drink alcohol or use illicit drugs.   Family History:  The patient's family history includes Heart disease in his father and mother; Hypertension in his sister.    ROS:  Please see the history of present illness.    Review of Systems: Constitutional:  denies fever, chills, diaphoresis, appetite change and fatigue.  HEENT: denies photophobia, eye pain, redness, hearing loss, ear pain, congestion, sore throat, rhinorrhea, sneezing, neck pain, neck stiffness and tinnitus.  Respiratory: denies SOB, DOE, cough, chest tightness, and wheezing.  Cardiovascular: denies chest pain, palpitations and leg swelling.  Gastrointestinal: denies nausea, vomiting, abdominal pain, diarrhea, constipation, blood in stool.  Genitourinary: denies dysuria, urgency, frequency, hematuria, flank pain and difficulty urinating.  Musculoskeletal: denies  myalgias, back pain, joint swelling, arthralgias and gait problem.  Skin: denies pallor, rash and wound.  Neurological: denies dizziness, seizures, syncope, weakness, light-headedness, numbness and headaches.   Hematological: denies adenopathy, easy bruising, personal or family bleeding history.  Psychiatric/ Behavioral: denies suicidal ideation, mood changes, confusion, nervousness, sleep disturbance and agitation.       All other systems are reviewed and negative.    PHYSICAL EXAM: VS:  BP 114/90 mmHg  Pulse 72  Ht 5\' 11"  (1.803 m)  Wt 210 lb 9.6 oz (95.528 kg)  BMI 29.39 kg/m2  SpO2 96% , BMI Body mass index is 29.39 kg/(m^2). GEN: Well  nourished, well developed, in no acute distress HEENT: normal Neck: no JVD, carotid bruits, or masses Cardiac: Irreg. Irreg ; no murmurs, rubs, or gallops,no edema  Respiratory:  clear to auscultation bilaterally, normal work of breathing GI: soft, nontender, nondistended, + BS MS: no deformity or atrophy Skin: warm and dry, no rash Neuro:  Strength and sensation are intact Psych: normal   EKG:  EKG is not ordered today.    Recent Labs: 04/30/2014: ALT 18; BUN 14; Creatinine 1.1; Hemoglobin 16.5; Platelets 193.0; Potassium 4.2; Sodium 140    Lipid Panel    Component Value Date/Time   CHOL 134 04/30/2014 0838   TRIG 151.0* 04/30/2014 0838   HDL 25.00* 04/30/2014 0838   CHOLHDL 5 04/30/2014 0838   VLDL 30.2 04/30/2014 0838   LDLCALC 79 04/30/2014 0838      Wt Readings from Last 3 Encounters:  08/04/14 210 lb 9.6 oz (95.528 kg)  04/30/14 208 lb 12.8 oz (94.711 kg)  05/16/13 203 lb (92.08 kg)      Other studies Reviewed: Additional studies/ records that were reviewed today include: . Review of the above records demonstrates:    ASSESSMENT AND PLAN:  Problem list: 1. Atrial fibrillation- he remains in atrial fibrillation. His rate is well-controlled. Continue current medications.  2. Hypertension- we added HCTZ and potassium at his last office visit. He seems to be tolerating them quite well. His blood pressure is well-controlled today. Continue current medications. We have drawn labs today.  3. Hyperlipidemia- continue current dose of simvastatin. His last lipid level from several months ago is well-controlled.   Current medicines are reviewed at length with the patient today.  The patient does not have concerns regarding medicines.  The following changes have been made:  no change   Disposition:   FU with me in 6 months      Signed, Nahser, Wonda Cheng, MD  08/04/2014 8:40 AM    [facility] [place], [place], [place]   [address] Phone: [phone]; Fax: [phone]

## 2014-08-27 ENCOUNTER — Other Ambulatory Visit: Payer: Self-pay | Admitting: Cardiovascular Disease

## 2014-11-02 ENCOUNTER — Other Ambulatory Visit: Payer: Self-pay | Admitting: Cardiovascular Disease

## 2015-01-19 ENCOUNTER — Other Ambulatory Visit: Payer: Self-pay

## 2015-01-19 DIAGNOSIS — E7800 Pure hypercholesterolemia, unspecified: Secondary | ICD-10-CM

## 2015-01-19 MED ORDER — RIVAROXABAN 20 MG PO TABS: 20.0000 mg | ORAL_TABLET | Freq: Every day | ORAL | Status: DC

## 2015-03-02 ENCOUNTER — Ambulatory Visit (INDEPENDENT_AMBULATORY_CARE_PROVIDER_SITE_OTHER): Payer: Medicare Other | Admitting: Cardiovascular Disease

## 2015-03-02 ENCOUNTER — Encounter: Payer: Self-pay | Admitting: Cardiovascular Disease

## 2015-03-02 VITALS — BP 128/88 | HR 64 | Ht 71.0 in | Wt 212.0 lb

## 2015-03-02 DIAGNOSIS — E7800 Pure hypercholesterolemia, unspecified: Secondary | ICD-10-CM

## 2015-03-02 DIAGNOSIS — I482 Chronic atrial fibrillation, unspecified: Secondary | ICD-10-CM

## 2015-03-02 DIAGNOSIS — I10 Essential (primary) hypertension: Secondary | ICD-10-CM | POA: Diagnosis not present

## 2015-03-02 LAB — LIPID PANEL
Cholesterol: 172 mg/dL (ref 0–200)
HDL: 32.8 mg/dL — AB (ref >39.00)
LDL Cholesterol: 110 mg/dL — ABNORMAL HIGH (ref 0–99)
NonHDL: 138.91
TRIGLYCERIDES: 144 mg/dL (ref 0.0–149.0)
Total CHOL/HDL Ratio: 5
VLDL: 28.8 mg/dL (ref 0.0–40.0)

## 2015-03-02 LAB — HEPATIC FUNCTION PANEL
ALT: 22 U/L (ref 0–53)
AST: 30 U/L (ref 0–37)
Albumin: 3.8 g/dL (ref 3.5–5.2)
Alkaline Phosphatase: 27 U/L — ABNORMAL LOW (ref 39–117)
Bilirubin, Direct: 0.1 mg/dL (ref 0.0–0.3)
Total Bilirubin: 1 mg/dL (ref 0.2–1.2)
Total Protein: 6.5 g/dL (ref 6.0–8.3)

## 2015-03-02 LAB — BASIC METABOLIC PANEL
BUN: 16 mg/dL (ref 6–23)
CALCIUM: 9.6 mg/dL (ref 8.4–10.5)
CO2: 34 mEq/L — ABNORMAL HIGH (ref 19–32)
CREATININE: 1.06 mg/dL (ref 0.40–1.50)
Chloride: 101 mEq/L (ref 96–112)
GFR: 72.95 mL/min (ref >60.00)
Glucose, Bld: 86 mg/dL (ref 70–99)
Potassium: 3.8 mEq/L (ref 3.5–5.1)
Sodium: 141 mEq/L (ref 135–145)

## 2015-03-02 MED ORDER — RIVAROXABAN 20 MG PO TABS: 20.0000 mg | ORAL_TABLET | Freq: Every day | ORAL | Status: DC

## 2015-03-02 MED ORDER — METOPROLOL TARTRATE 50 MG PO TABS: 25.0000 mg | ORAL_TABLET | Freq: Two times a day (BID) | ORAL | Status: DC

## 2015-03-02 NOTE — Patient Instructions (Addendum)

## 2015-03-02 NOTE — Progress Notes (Signed)
Cardiology Office Note   Date:  03/02/2015   ID:  Jerome Sullivan, DOB 1940-07-06, MRN [id number]  PCP:  Cyndy Freeze, MD  Cardiologist:   Thayer Headings, MD   Chief Complaint  Patient presents with  . Atrial Fibrillation   Problem list: 1. Atrial fibrillation 2. Hypertension 3. Hyperlipidemia  History of Present Illness:  Jerome Sullivan is doing well. We tried Metoprolol 50 BID but this caused significant dyspnea. He reduced his dose back down to 25 BID and now feels well.  September 24, 2012: Jerome Sullivan is doing well. No problems. He has some dyspne . He works in the garden. - does not walk regularly. He is still eating salt / sea salt.  Nov. 21, 2014: Jerome Sullivan is doing ok. No CP , no dypsnea. Not walking regularly. Trying to avoid some salt. Has GERD symptoms - especially when he lies down.   Nov. 5, 2015: Jerome Sullivan is doing ok. Still eating some salt  Feb. 9, 2016: Jerome Sullivan is a 74 y.o. male who presents for follow up of his Afib and HTN Agamjot is doing ok.  Went rabbit hunting 2 days ago.  No CP or dypsnea with walking.  Just some leg cramping afterwards.   Sept. 6, 2016:  Doing well.  Has some dyspnea.  No CP    Past Medical History  Diagnosis Date  . A-fib     failed cardioversion  . Hypercholesteremia   . HTN (hypertension)     No past surgical history on file.   Current Outpatient Prescriptions  Medication Sig Dispense Refill  . CALCIUM PO Take 1,200 mg by mouth 2 (two) times daily.      . Cholecalciferol (VITAMIN D3) 2000 UNITS TABS Take 1,000 mg by mouth daily.     . Coenzyme Q10 (CO Q 10 PO) Take 1 tablet by mouth 2 (two) times daily.     . hydrochlorothiazide (HYDRODIURIL) 25 MG tablet TAKE 1 TABLET BY MOUTH ONCE DAILY 30 tablet 5  . metoprolol (LOPRESSOR) 50 MG tablet Take 25 mg by mouth 2 (two) times daily.    . potassium chloride (K-DUR,KLOR-CON) 20 MEQ tablet Take 1 tablet (20 mEq total) by mouth daily. 31 tablet 11  . rivaroxaban (XARELTO) 20 MG TABS  tablet Take 1 tablet (20 mg total) by mouth daily. 90 tablet 1   No current facility-administered medications for this visit.    Allergies:   Review of patient's allergies indicates no known allergies.    Social History:  The patient  reports that he quit smoking about 36 years ago. His smoking use included Cigarettes. He does not have any smokeless tobacco history on file. He reports that he does not drink alcohol or use illicit drugs.   Family History:  The patient's family history includes Heart disease in his father and mother; Hypertension in his sister.    ROS:  Please see the history of present illness.    Review of Systems: Constitutional:  denies fever, chills, diaphoresis, appetite change and fatigue.  HEENT: denies photophobia, eye pain, redness, hearing loss, ear pain, congestion, sore throat, rhinorrhea, sneezing, neck pain, neck stiffness and tinnitus.  Respiratory: denies SOB, DOE, cough, chest tightness, and wheezing.  Cardiovascular: denies chest pain, palpitations and leg swelling.  Gastrointestinal: denies nausea, vomiting, abdominal pain, diarrhea, constipation, blood in stool.  Genitourinary: denies dysuria, urgency, frequency, hematuria, flank pain and difficulty urinating.  Musculoskeletal: denies  myalgias, back pain, joint swelling, arthralgias and gait problem.   Skin:  denies pallor, rash and wound.  Neurological: denies dizziness, seizures, syncope, weakness, light-headedness, numbness and headaches.   Hematological: denies adenopathy, easy bruising, personal or family bleeding history.  Psychiatric/ Behavioral: denies suicidal ideation, mood changes, confusion, nervousness, sleep disturbance and agitation.       All other systems are reviewed and negative.    PHYSICAL EXAM: VS:  BP 128/88 mmHg  Pulse 64  Ht 5\' 11"  (1.803 m)  Wt 96.163 kg (212 lb)  BMI 29.58 kg/m2 , BMI Body mass index is 29.58 kg/(m^2). GEN: Well nourished, well developed, in no  acute distress HEENT: normal Neck: no JVD, carotid bruits, or masses Cardiac: Irreg. Irreg ; no murmurs, rubs, or gallops,no edema  Respiratory:  clear to auscultation bilaterally, normal work of breathing GI: soft, nontender, nondistended, + BS MS: no deformity or atrophy Skin: warm and dry, no rash Neuro:  Strength and sensation are intact Psych: normal   EKG:  EKG is not ordered today.  Recent Labs: 04/30/2014: ALT 18; Hemoglobin 16.5; Platelets 193.0 08/04/2014: BUN 22; Creatinine, Ser 1.12; Potassium 3.5; Sodium 140    Lipid Panel    Component Value Date/Time   CHOL 134 04/30/2014 0838   TRIG 151.0* 04/30/2014 0838   HDL 25.00* 04/30/2014 0838   CHOLHDL 5 04/30/2014 0838   VLDL 30.2 04/30/2014 0838   LDLCALC 79 04/30/2014 0838      Wt Readings from Last 3 Encounters:  03/02/15 96.163 kg (212 lb)  08/04/14 95.528 kg (210 lb 9.6 oz)  04/30/14 94.711 kg (208 lb 12.8 oz)      Other studies Reviewed: Additional studies/ records that were reviewed today include: . Review of the above records demonstrates:    ASSESSMENT AND PLAN:  Problem list: 1. Atrial fibrillation- he remains in atrial fibrillation. His rate is well-controlled. Continue current medications.  2. Hypertension- we added HCTZ and potassium at his last office visit. He seems to be tolerating them quite well. His blood pressure is well-controlled today. Continue current medications. We have drawn labs today.  3. Hyperlipidemia- continue current dose of simvastatin. His last lipid level from several months ago is well-controlled. Check labs today   Encouraged him to start a walking program.   Current medicines are reviewed at length with the patient today.  The patient does not have concerns regarding medicines.  The following changes have been made:  no change   Disposition:   FU with me in 6 months .     Signed, Maclin Guerrette, Wonda Cheng, MD  03/02/2015 8:34 AM    [facility], [place], [place]  [address] Phone: [phone]; Fax: [phone]

## 2015-05-26 ENCOUNTER — Other Ambulatory Visit: Payer: Self-pay

## 2015-05-26 MED ORDER — HYDROCHLOROTHIAZIDE 25 MG PO TABS: 25.0000 mg | ORAL_TABLET | Freq: Every day | ORAL | Status: DC

## 2015-08-23 ENCOUNTER — Other Ambulatory Visit: Payer: Self-pay | Admitting: Cardiovascular Disease

## 2015-08-31 ENCOUNTER — Ambulatory Visit (INDEPENDENT_AMBULATORY_CARE_PROVIDER_SITE_OTHER): Payer: Medicare Other | Admitting: Cardiovascular Disease

## 2015-08-31 ENCOUNTER — Encounter: Payer: Self-pay | Admitting: Cardiovascular Disease

## 2015-08-31 VITALS — BP 118/85 | HR 59 | Ht 71.0 in | Wt 211.1 lb

## 2015-08-31 DIAGNOSIS — I4891 Unspecified atrial fibrillation: Secondary | ICD-10-CM | POA: Diagnosis not present

## 2015-08-31 DIAGNOSIS — I10 Essential (primary) hypertension: Secondary | ICD-10-CM | POA: Diagnosis not present

## 2015-08-31 DIAGNOSIS — E7800 Pure hypercholesterolemia, unspecified: Secondary | ICD-10-CM

## 2015-08-31 LAB — HEPATIC FUNCTION PANEL
ALBUMIN: 3.7 g/dL (ref 3.6–5.1)
ALK PHOS: 28 U/L — AB (ref 40–115)
ALT: 14 U/L (ref 9–46)
AST: 22 U/L (ref 10–35)
Bilirubin, Direct: 0.2 mg/dL (ref <=0.2)
Indirect Bilirubin: 0.8 mg/dL (ref 0.2–1.2)
TOTAL PROTEIN: 6.2 g/dL (ref 6.1–8.1)
Total Bilirubin: 1 mg/dL (ref 0.2–1.2)

## 2015-08-31 LAB — BASIC METABOLIC PANEL
BUN: 16 mg/dL (ref 7–25)
CALCIUM: 9.4 mg/dL (ref 8.6–10.3)
CO2: 29 mmol/L (ref 20–31)
CREATININE: 1.1 mg/dL (ref 0.70–1.18)
Chloride: 101 mmol/L (ref 98–110)
GLUCOSE: 81 mg/dL (ref 65–99)
Potassium: 3.7 mmol/L (ref 3.5–5.3)
Sodium: 138 mmol/L (ref 135–146)

## 2015-08-31 LAB — CBC WITH DIFFERENTIAL/PLATELET
Basophils Absolute: 0.1 10*3/uL (ref 0.0–0.1)
Basophils Relative: 1 % (ref 0–1)
EOS ABS: 0.2 10*3/uL (ref 0.0–0.7)
EOS PCT: 4 % (ref 0–5)
HCT: 47.9 % (ref 39.0–52.0)
Hemoglobin: 16.6 g/dL (ref 13.0–17.0)
LYMPHS ABS: 1.7 10*3/uL (ref 0.7–4.0)
Lymphocytes Relative: 32 % (ref 12–46)
MCH: 28.6 pg (ref 26.0–34.0)
MCHC: 34.7 g/dL (ref 30.0–36.0)
MCV: 82.6 fL (ref 78.0–100.0)
MONOS PCT: 9 % (ref 3–12)
MPV: 10.5 fL (ref 8.6–12.4)
Monocytes Absolute: 0.5 10*3/uL (ref 0.1–1.0)
NEUTROS PCT: 54 % (ref 43–77)
Neutro Abs: 2.9 10*3/uL (ref 1.7–7.7)
PLATELETS: 194 10*3/uL (ref 150–400)
RBC: 5.8 MIL/uL (ref 4.22–5.81)
RDW: 13.6 % (ref 11.5–15.5)
WBC: 5.3 10*3/uL (ref 4.0–10.5)

## 2015-08-31 LAB — LIPID PANEL
Cholesterol: 176 mg/dL (ref 125–200)
HDL: 30 mg/dL — AB (ref >=40)
LDL Cholesterol: 115 mg/dL (ref <130)
Total CHOL/HDL Ratio: 5.9 Ratio — ABNORMAL HIGH (ref <=5.0)
Triglycerides: 156 mg/dL — ABNORMAL HIGH (ref <150)
VLDL: 31 mg/dL — ABNORMAL HIGH (ref <30)

## 2015-08-31 MED ORDER — HYDROCHLOROTHIAZIDE 25 MG PO TABS: 25.0000 mg | ORAL_TABLET | Freq: Every day | ORAL | Status: DC

## 2015-08-31 MED ORDER — POTASSIUM CHLORIDE CRYS ER 20 MEQ PO TBCR: 20.0000 meq | EXTENDED_RELEASE_TABLET | Freq: Every day | ORAL | Status: DC

## 2015-08-31 NOTE — Progress Notes (Signed)
Cardiology Office Note   Date:  08/31/2015   ID:  EMRAN GRGAS, DOB 08/01/1940, MRN [id number]  PCP:  Cyndy Freeze, MD  Cardiologist:   Thayer Headings, MD   Chief Complaint  Patient presents with  . Follow-up    afib   Problem list: 1. Atrial fibrillation 2. Hypertension 3. Hyperlipidemia  History of Present Illness:  Jerome Sullivan is doing well. We tried Metoprolol 50 BID but this caused significant dyspnea. He reduced his dose back down to 25 BID and now feels well.  September 24, 2012: Jerome Sullivan is doing well. No problems. He has some dyspne . He works in the garden. - does not walk regularly. He is still eating salt / sea salt.  Nov. 21, 2014: Jerome Sullivan is doing ok. No CP , no dypsnea. Not walking regularly. Trying to avoid some salt. Has GERD symptoms - especially when he lies down.   Nov. 5, 2015: Jerome Sullivan is doing ok. Still eating some salt  Feb. 9, 2016: Jerome Sullivan is a 75 y.o. male who presents for follow up of his Afib and HTN [name] is doing ok.  Went rabbit hunting 2 days ago.  No CP or dypsnea with walking.  Just some leg cramping afterwards.   Sept. 6, 2016:  Doing well.  Has some dyspnea.  No CP    August 31, 2015:  Has some DOE climbing stairs.  Has some right foot pain - has plantar fascitis.   Past Medical History  Diagnosis Date  . A-fib [facility])     failed cardioversion  . Hypercholesteremia   . HTN (hypertension)     No past surgical history on file.   Current Outpatient Prescriptions  Medication Sig Dispense Refill  . CALCIUM PO Take 1,200 mg by mouth 2 (two) times daily.      . cetirizine (ZYRTEC) 10 MG tablet Take 10 mg by mouth daily.    . Cholecalciferol (VITAMIN D3) 2000 UNITS TABS Take 1,000 mg by mouth daily.     . Coenzyme Q10 (CO Q 10 PO) Take 1 tablet by mouth 2 (two) times daily.     . hydrochlorothiazide (HYDRODIURIL) 25 MG tablet Take 1 tablet (25 mg total) by mouth daily. 30 tablet 11  . magnesium gluconate (MAGONATE) 500 MG  tablet Take 500 mg by mouth daily.    . metoprolol (LOPRESSOR) 50 MG tablet Take 0.5 tablets (25 mg total) by mouth 2 (two) times daily. 90 tablet 3  . potassium chloride SA (K-DUR,KLOR-CON) 20 MEQ tablet TAKE 1 TABLET BY MOUTH ONCE DAILY 30 tablet 6  . rivaroxaban (XARELTO) 20 MG TABS tablet Take 1 tablet (20 mg total) by mouth daily. 31 tablet 11   No current facility-administered medications for this visit.    Allergies:   Review of patient's allergies indicates no known allergies.    Social History:  The patient  reports that he quit smoking about 37 years ago. His smoking use included Cigarettes. He does not have any smokeless tobacco history on file. He reports that he does not drink alcohol or use illicit drugs.   Family History:  The patient's family history includes Heart disease in his father and mother; Hypertension in his sister.    ROS:  Please see the history of present illness.    Review of Systems: Constitutional:  denies fever, chills, diaphoresis, appetite change and fatigue.  HEENT: denies photophobia, eye pain, redness, hearing loss, ear pain, congestion, sore throat, rhinorrhea, sneezing, neck pain, neck stiffness  and tinnitus.  Respiratory: denies SOB, DOE, cough, chest tightness, and wheezing.  Cardiovascular: denies chest pain, palpitations and leg swelling.  Gastrointestinal: denies nausea, vomiting, abdominal pain, diarrhea, constipation, blood in stool.  Genitourinary: denies dysuria, urgency, frequency, hematuria, flank pain and difficulty urinating.  Musculoskeletal: denies  myalgias, back pain, joint swelling, arthralgias and gait problem.   Skin: denies pallor, rash and wound.  Neurological: denies dizziness, seizures, syncope, weakness, light-headedness, numbness and headaches.   Hematological: denies adenopathy, easy bruising, personal or family bleeding history.  Psychiatric/ Behavioral: denies suicidal ideation, mood changes, confusion, nervousness,  sleep disturbance and agitation.       All other systems are reviewed and negative.    PHYSICAL EXAM: VS:  BP 122/112 mmHg  Pulse 59  Ht 5\' 11"  (1.803 m)  Wt 211 lb 1.9 oz (95.763 kg)  BMI 29.46 kg/m2 , BMI Body mass index is 29.46 kg/(m^2). GEN: Well nourished, well developed, in no acute distress HEENT: normal Neck: no JVD, carotid bruits, or masses Cardiac: Irreg. Irreg ; no murmurs, rubs, or gallops,no edema  Respiratory:  clear to auscultation bilaterally, normal work of breathing GI: soft, nontender, nondistended, + BS MS: no deformity or atrophy Skin: warm and dry, no rash Neuro:  Strength and sensation are intact Psych: normal   EKG:  EKG is ordered today.   Atrial fib with ventricular response.  HR of 59.   No ST or T wave changes.   Recent Labs: 03/02/2015: ALT 22; BUN 16; Creatinine, Ser 1.06; Potassium 3.8; Sodium 141    Lipid Panel    Component Value Date/Time   CHOL 172 03/02/2015 0903   TRIG 144.0 03/02/2015 0903   HDL 32.80* 03/02/2015 0903   CHOLHDL 5 03/02/2015 0903   VLDL 28.8 03/02/2015 0903   LDLCALC 110* 03/02/2015 0903      Wt Readings from Last 3 Encounters:  08/31/15 211 lb 1.9 oz (95.763 kg)  03/02/15 212 lb (96.163 kg)  08/04/14 210 lb 9.6 oz (95.528 kg)      Other studies Reviewed: Additional studies/ records that were reviewed today include: . Review of the above records demonstrates:    ASSESSMENT AND PLAN:  Problem list: 1. Atrial fibrillation- he remains in atrial fibrillation. His rate is well-controlled. Continue current medications. Check CBC, BMP today   2. Hypertension- we added HCTZ and potassium at his last office visit. He seems to be tolerating them quite well. His blood pressure is well-controlled today. Continue current medications. We have drawn labs today.  3. Hyperlipidemia- continue current dose of simvastatin. His last lipid level from several months ago is well-controlled. Check labs today  Check lipids  and liver enz. Today   Encouraged him to start a walking program.   Current medicines are reviewed at length with the patient today.  The patient does not have concerns regarding medicines.  The following changes have been made:  no change   Disposition:   FU with me in 1 year.    Signed, Samani Deal, Wonda Cheng, MD  08/31/2015 9:40 AM    [facility], [place], [place]  [address] Phone: [phone]; Fax: [phone]

## 2015-08-31 NOTE — Patient Instructions (Signed)
Medication Instructions:  Your physician recommends that you continue on your current medications as directed. Please refer to the Current Medication list given to you today.   Labwork: Bmet, Cbc, Lft, Lipid today  Testing/Procedures: None ordered  Follow-Up: Your physician wants you to follow-up in: 1 year with Dr.Nahser You will receive a reminder letter in the mail two months in advance. If you don't receive a letter, please call our office to schedule the follow-up appointment.   Any Other Special Instructions Will Be Listed Below (If Applicable).     If you need a refill on your cardiac medications before your next appointment, please call your pharmacy.

## 2015-09-07 ENCOUNTER — Telehealth: Payer: Self-pay | Admitting: Cardiovascular Disease

## 2015-09-07 NOTE — Telephone Encounter (Signed)
New message ° ° ° ° ° °Calling to get test results °

## 2015-09-07 NOTE — Telephone Encounter (Signed)
Returned call to patient's wife lab results given.

## 2016-02-03 ENCOUNTER — Telehealth: Payer: Self-pay | Admitting: Nurse Practitioner

## 2016-02-03 ENCOUNTER — Telehealth: Payer: Self-pay | Admitting: Cardiovascular Disease

## 2016-02-03 NOTE — Telephone Encounter (Signed)
Spoke with patient's wife who states patient has bruising to left upper arm down to elbow.  She states she is concerned about a blood clot.  I advised that Xarelto is used to prevent blood clots and is more likely to cause bleeding.  She denies that he has had any injury to the arm.  States pain started in left shoulder at area of scapula and patient had difficulty moving his arm.  She took him to the ED in [place] on Sunday and patient had xray and was told him he had arthritis and given pain medication.  Saw PCP, Dr. Cathi Roan, yesterday and was given prednisone Rx, and advised to ice arm and if no better in 12 days needs MRI. PCP told patient to continue Xarelto.  I asked patient's wife questions about possible injury to the arm and she was uncertain so I called the patient.  He states he had pain in his left arm/shoulder Saturday night and was squeezing his arm to try to alleviate the pain.  States he noticed the bruising on Sunday after squeezing the arm the previous day.  He states today his arm is getting better and he can stretch it out; only taking Tylenol for pain because was advised by previous PCP never to take prednisone with his heart medications.  States the bruising is spreading down the arm and is purple in color, getting darker today.  Denies pain in arm and states there might be some swelling.  Able to move hands without difficulty and denies numbness, tingling.  He denies chest or jaw pain and states he does not feel that the pain he felt in his shoulder was related to his heart. I advised him to ice his arm and to keep it at the level of his heart whenever he is sitting. I advised that he should call his PCP for follow-up since Dr. Acie Fredrickson is not in the office today and he has already been evaluated by Dr. Cathi Roan.  Patient verbalized understanding and agreement.

## 2016-02-03 NOTE — Telephone Encounter (Signed)
Received note on this patient from operator and discovered upon talking to correct patient that this is not the patient's correct chart

## 2016-02-03 NOTE — Telephone Encounter (Signed)
New message     Pt wife wants to get an earlier appt than offered on 10/05 with Dr. Acie Fredrickson. Pt wife did not want to see an APP. She Please call.

## 2016-02-04 NOTE — Telephone Encounter (Signed)
Agree with note by Michelle Swinyer, RN  

## 2016-03-28 ENCOUNTER — Other Ambulatory Visit: Payer: Self-pay | Admitting: Cardiovascular Disease

## 2016-03-28 DIAGNOSIS — E7800 Pure hypercholesterolemia, unspecified: Secondary | ICD-10-CM

## 2016-03-31 ENCOUNTER — Other Ambulatory Visit: Payer: Self-pay | Admitting: Cardiovascular Disease

## 2016-08-25 ENCOUNTER — Encounter: Payer: Self-pay | Admitting: Cardiovascular Disease

## 2016-09-04 ENCOUNTER — Ambulatory Visit (INDEPENDENT_AMBULATORY_CARE_PROVIDER_SITE_OTHER): Payer: Medicare PPO | Admitting: Cardiovascular Disease

## 2016-09-04 ENCOUNTER — Encounter: Payer: Self-pay | Admitting: Cardiovascular Disease

## 2016-09-04 ENCOUNTER — Encounter (INDEPENDENT_AMBULATORY_CARE_PROVIDER_SITE_OTHER): Payer: Self-pay

## 2016-09-04 VITALS — BP 134/94 | HR 48 | Ht 71.0 in | Wt 214.8 lb

## 2016-09-04 DIAGNOSIS — R0602 Shortness of breath: Secondary | ICD-10-CM

## 2016-09-04 DIAGNOSIS — Z79899 Other long term (current) drug therapy: Secondary | ICD-10-CM | POA: Diagnosis not present

## 2016-09-04 LAB — COMPREHENSIVE METABOLIC PANEL
A/G RATIO: 1.6 (ref 1.2–2.2)
ALT: 19 IU/L (ref 0–44)
AST: 26 IU/L (ref 0–40)
Albumin: 4 g/dL (ref 3.5–4.8)
Alkaline Phosphatase: 41 IU/L (ref 39–117)
BILIRUBIN TOTAL: 0.9 mg/dL (ref 0.0–1.2)
BUN/Creatinine Ratio: 13 (ref 10–24)
BUN: 15 mg/dL (ref 8–27)
CO2: 30 mmol/L — ABNORMAL HIGH (ref 18–29)
Calcium: 9.6 mg/dL (ref 8.6–10.2)
Chloride: 95 mmol/L — ABNORMAL LOW (ref 96–106)
Creatinine, Ser: 1.16 mg/dL (ref 0.76–1.27)
GFR calc non Af Amer: 62 mL/min/{1.73_m2} (ref >59)
GFR, EST AFRICAN AMERICAN: 72 mL/min/{1.73_m2} (ref >59)
Globulin, Total: 2.5 g/dL (ref 1.5–4.5)
Glucose: 87 mg/dL (ref 65–99)
POTASSIUM: 3.6 mmol/L (ref 3.5–5.2)
SODIUM: 141 mmol/L (ref 134–144)
TOTAL PROTEIN: 6.5 g/dL (ref 6.0–8.5)

## 2016-09-04 LAB — CBC WITH DIFFERENTIAL/PLATELET
Basophils Absolute: 0 10*3/uL (ref 0.0–0.2)
Basos: 0 %
EOS (ABSOLUTE): 0.1 10*3/uL (ref 0.0–0.4)
EOS: 1 %
HEMATOCRIT: 47.9 % (ref 37.5–51.0)
HEMOGLOBIN: 17.1 g/dL (ref 13.0–17.7)
IMMATURE GRANULOCYTES: 1 %
Immature Grans (Abs): 0 10*3/uL (ref 0.0–0.1)
Lymphocytes Absolute: 1.5 10*3/uL (ref 0.7–3.1)
Lymphs: 22 %
MCH: 30.2 pg (ref 26.6–33.0)
MCHC: 35.7 g/dL (ref 31.5–35.7)
MCV: 85 fL (ref 79–97)
MONOCYTES: 11 %
Monocytes Absolute: 0.7 10*3/uL (ref 0.1–0.9)
Neutrophils Absolute: 4.3 10*3/uL (ref 1.4–7.0)
Neutrophils: 65 %
Platelets: 155 10*3/uL (ref 150–379)
RBC: 5.66 x10E6/uL (ref 4.14–5.80)
RDW: 13.8 % (ref 12.3–15.4)
WBC: 6.6 10*3/uL (ref 3.4–10.8)

## 2016-09-04 LAB — LIPID PANEL
CHOL/HDL RATIO: 4.4 ratio (ref 0.0–5.0)
Cholesterol, Total: 145 mg/dL (ref 100–199)
HDL: 33 mg/dL — AB (ref >39)
LDL CALC: 77 mg/dL (ref 0–99)
TRIGLYCERIDES: 173 mg/dL — AB (ref 0–149)
VLDL Cholesterol Cal: 35 mg/dL (ref 5–40)

## 2016-09-04 NOTE — Patient Instructions (Signed)
Your physician recommends that you continue on your current medications as directed. Please refer to the Current Medication list given to you today.  Your physician recommends that you return for lab work in:  [place] physician has requested that you have an echocardiogram. Echocardiography is a painless test that uses sound waves to create images of your heart. It provides your doctor with information about the size and shape of your heart and how well your heart's chambers and valves are working. This procedure takes approximately one hour. There are no restrictions for this procedure.    Your physician wants you to follow-up in: [place] will receive a reminder letter in the mail two months in advance. If you don't receive a letter, please call our office to schedule the follow-up appointment.

## 2016-09-04 NOTE — Progress Notes (Signed)
Cardiology Office Note   Date:  09/04/2016   ID:  Jerome Sullivan, DOB Jul 06, 1940, MRN [id number]  PCP:  Cyndy Freeze, MD  Cardiologist:   Mertie Moores, MD   Chief Complaint  Patient presents with  . Atrial Fibrillation   Problem list: 1. Atrial fibrillation 2. Hypertension 3. Hyperlipidemia  History of Present Illness:  Jerome Sullivan is doing well. We tried Metoprolol 50 BID but this caused significant dyspnea. He reduced his dose back down to 25 BID and now feels well.  September 24, 2012: Jerome Sullivan is doing well. No problems. He has some dyspne . He works in the garden. - does not walk regularly. He is still eating salt / sea salt.  Nov. 21, 2014: Jerome Sullivan is doing ok. No CP , no dypsnea. Not walking regularly. Trying to avoid some salt. Has GERD symptoms - especially when he lies down.   Nov. 5, 2015: Jerome Sullivan is doing ok. Still eating some salt  Feb. 9, 2016: Jerome Sullivan is a 76 y.o. male who presents for follow up of his Afib and HTN Jerome Sullivan is doing ok.  Went rabbit hunting 2 days ago.  No CP or dypsnea with walking.  Just some leg cramping afterwards.   Sept. 6, 2016:  Doing well.  Has some dyspnea.  No CP    August 31, 2015:  Has some DOE climbing stairs.  Has some right foot pain - has plantar fascitis.   September 04, 2016:  Jerome Sullivan is seen today for follow-up of his atrial fibrillation. He also has a history of hypertension and hyperlipidemia. Has some shortness of breath .   Worse this year.  Still does rabbit hunting    Past Medical History:  Diagnosis Date  . A-fib [facility])    failed cardioversion  . HTN (hypertension)   . Hypercholesteremia     No past surgical history on file.   Current Outpatient Prescriptions  Medication Sig Dispense Refill  . CALCIUM PO Take 1,200 mg by mouth 2 (two) times daily.      . cetirizine (ZYRTEC) 10 MG tablet Take 10 mg by mouth daily.    . Cholecalciferol (VITAMIN D3) 2000 UNITS TABS Take 1,000 mg by mouth daily.     .  Coenzyme Q10 (CO Q 10 PO) Take 1 tablet by mouth 2 (two) times daily.     . hydrochlorothiazide (HYDRODIURIL) 25 MG tablet Take 1 tablet (25 mg total) by mouth daily. 90 tablet 3  . magnesium gluconate (MAGONATE) 500 MG tablet Take 500 mg by mouth daily.    . metoprolol (LOPRESSOR) 50 MG tablet TAKE 1/2 TABLET BY MOUTH TWICE A DAY. 90 tablet 1  . potassium chloride SA (K-DUR,KLOR-CON) 20 MEQ tablet Take 1 tablet (20 mEq total) by mouth daily. 90 tablet 3  . rosuvastatin (CRESTOR) 20 MG tablet Take 20 mg by mouth daily.    Alveda Reasons 20 MG TABS tablet TAKE 1 TABLET BY MOUTH ONCE DAILY 30 tablet 11   No current facility-administered medications for this visit.     Allergies:   Patient has no known allergies.    Social History:  The patient  reports that he quit smoking about 38 years ago. His smoking use included Cigarettes. He has never used smokeless tobacco. He reports that he does not drink alcohol or use drugs.   Family History:  The patient's family history includes Heart disease in his father and mother; Hypertension in his sister.    ROS:  Please see the  history of present illness.    Review of Systems: Constitutional:  denies fever, chills, diaphoresis, appetite change and fatigue.  HEENT: denies photophobia, eye pain, redness, hearing loss, ear pain, congestion, sore throat, rhinorrhea, sneezing, neck pain, neck stiffness and tinnitus.  Respiratory: denies SOB, DOE, cough, chest tightness, and wheezing.  Cardiovascular: denies chest pain, palpitations and leg swelling.  Gastrointestinal: denies nausea, vomiting, abdominal pain, diarrhea, constipation, blood in stool.  Genitourinary: denies dysuria, urgency, frequency, hematuria, flank pain and difficulty urinating.  Musculoskeletal: denies  myalgias, back pain, joint swelling, arthralgias and gait problem.   Skin: denies pallor, rash and wound.  Neurological: denies dizziness, seizures, syncope, weakness, light-headedness,  numbness and headaches.   Hematological: denies adenopathy, easy bruising, personal or family bleeding history.  Psychiatric/ Behavioral: denies suicidal ideation, mood changes, confusion, nervousness, sleep disturbance and agitation.       All other systems are reviewed and negative.    PHYSICAL EXAM: VS:  BP (!) 134/94 (BP Location: Left Arm, Patient Position: Sitting, Cuff Size: Normal)   Pulse (!) 48   Ht 5\' 11"  (1.803 m)   Wt 214 lb 12.8 oz (97.4 kg)   SpO2 97%   BMI 29.96 kg/m  , BMI Body mass index is 29.96 kg/m. GEN: Well nourished, well developed, in no acute distress  HEENT: normal  Neck: no JVD, carotid bruits, or masses Cardiac: Irreg. Irreg ; no murmurs, rubs, or gallops,no edema  Respiratory:  clear to auscultation bilaterally, normal work of breathing GI: soft, nontender, nondistended, + BS MS: no deformity or atrophy  Skin: warm and dry, no rash Neuro:  Strength and sensation are intact Psych: normal   EKG:  EKG is ordered today.   Atrial fib with ventricular response.  HR of 48.      TWI in the lateral leads.   Recent Labs: No results found for requested labs within last 8760 hours.    Lipid Panel    Component Value Date/Time   CHOL 176 08/31/2015 1007   TRIG 156 (H) 08/31/2015 1007   HDL 30 (L) 08/31/2015 1007   CHOLHDL 5.9 (H) 08/31/2015 1007   VLDL 31 (H) 08/31/2015 1007   LDLCALC 115 08/31/2015 1007      Wt Readings from Last 3 Encounters:  09/04/16 214 lb 12.8 oz (97.4 kg)  08/31/15 211 lb 1.9 oz (95.8 kg)  03/02/15 212 lb (96.2 kg)      Other studies Reviewed: Additional studies/ records that were reviewed today include: . Review of the above records demonstrates:    ASSESSMENT AND PLAN:  Problem list: 1. Atrial fibrillation- he remains in atrial fibrillation. His rate is well-controlled. Continue current medications. Check CBC, BMP today   2. DOE :   Has more DOE recently . Will get an echo .   Last echo was 2011.  If the  echo does not show any abnormality or if his symptoms persists,  will plan on getting a myoview.  Needs to avoid eating salt.   2. Hypertension-    BP is ok Advised him to avoid salt .   We have drawn labs today.  3. Hyperlipidemia- continue current dose of crestor .  Marland Kitchen Check lipids and liver enz. Today   Encouraged him to start a walking program.   Current medicines are reviewed at length with the patient today.  The patient does not have concerns regarding medicines.  The following changes have been made:  no change   Disposition:   FU with  me in 1 year.    Signed, Mertie Moores, MD  09/04/2016 9:31 AM    [facility] [place], [place], [place]  [address] Phone: [phone]; Fax: [phone]

## 2016-09-20 ENCOUNTER — Ambulatory Visit (HOSPITAL_COMMUNITY): Payer: Medicare PPO | Attending: Cardiovascular Disease

## 2016-09-20 ENCOUNTER — Other Ambulatory Visit: Payer: Self-pay

## 2016-09-20 ENCOUNTER — Telehealth: Payer: Self-pay | Admitting: Cardiovascular Disease

## 2016-09-20 DIAGNOSIS — I517 Cardiomegaly: Secondary | ICD-10-CM | POA: Diagnosis not present

## 2016-09-20 DIAGNOSIS — R0602 Shortness of breath: Secondary | ICD-10-CM

## 2016-09-20 NOTE — Telephone Encounter (Signed)
New message     Pt is calling back about echo results

## 2016-09-21 ENCOUNTER — Telehealth: Payer: Self-pay | Admitting: Cardiovascular Disease

## 2016-09-21 NOTE — Telephone Encounter (Signed)
f/u message  Pt wife returning RN call about echo results. Please call back to discuss

## 2016-09-21 NOTE — Telephone Encounter (Signed)
Reviewed results of echo with patient's wife, Stanton Kidney ([place]), who verbalized understanding.  She thanked me for the call.

## 2016-10-09 ENCOUNTER — Other Ambulatory Visit: Payer: Self-pay | Admitting: Cardiovascular Disease

## 2017-03-23 ENCOUNTER — Telehealth: Payer: Self-pay | Admitting: *Deleted

## 2017-03-23 NOTE — Telephone Encounter (Signed)
Patient called and requested samples of xarelto as he is in the donut hole. I have made him aware that we are unable to provide samples for the remainder of the year but that I would place some samples at the front desk for him. Patient appreciative.

## 2017-04-27 ENCOUNTER — Other Ambulatory Visit: Payer: Self-pay | Admitting: Cardiovascular Disease

## 2017-04-27 DIAGNOSIS — E7800 Pure hypercholesterolemia, unspecified: Secondary | ICD-10-CM

## 2017-08-03 DIAGNOSIS — D696 Thrombocytopenia, unspecified: Secondary | ICD-10-CM | POA: Diagnosis not present

## 2017-08-03 DIAGNOSIS — E876 Hypokalemia: Secondary | ICD-10-CM | POA: Diagnosis not present

## 2017-08-03 DIAGNOSIS — D72819 Decreased white blood cell count, unspecified: Secondary | ICD-10-CM | POA: Diagnosis not present

## 2017-08-03 DIAGNOSIS — I10 Essential (primary) hypertension: Secondary | ICD-10-CM | POA: Diagnosis not present

## 2017-08-07 DIAGNOSIS — Z862 Personal history of diseases of the blood and blood-forming organs and certain disorders involving the immune mechanism: Secondary | ICD-10-CM | POA: Insufficient documentation

## 2017-08-10 DIAGNOSIS — D693 Immune thrombocytopenic purpura: Secondary | ICD-10-CM | POA: Diagnosis not present

## 2017-08-10 DIAGNOSIS — Z7952 Long term (current) use of systemic steroids: Secondary | ICD-10-CM | POA: Diagnosis not present

## 2017-08-17 ENCOUNTER — Inpatient Hospital Stay (HOSPITAL_COMMUNITY)
Admission: AD | Admit: 2017-08-17 | Discharge: 2017-08-22 | DRG: 300 | Disposition: A | Discharge status: Home / Self Care | Payer: Medicare PPO | Source: Other Acute Inpatient Hospital | Attending: Internal Medicine | Admitting: Internal Medicine

## 2017-08-17 ENCOUNTER — Other Ambulatory Visit: Payer: Self-pay

## 2017-08-17 ENCOUNTER — Encounter (HOSPITAL_COMMUNITY): Payer: Self-pay | Admitting: Emergency Medicine

## 2017-08-17 DIAGNOSIS — Z7901 Long term (current) use of anticoagulants: Secondary | ICD-10-CM

## 2017-08-17 DIAGNOSIS — I10 Essential (primary) hypertension: Secondary | ICD-10-CM | POA: Diagnosis present

## 2017-08-17 DIAGNOSIS — R062 Wheezing: Secondary | ICD-10-CM | POA: Diagnosis present

## 2017-08-17 DIAGNOSIS — M069 Rheumatoid arthritis, unspecified: Secondary | ICD-10-CM | POA: Diagnosis present

## 2017-08-17 DIAGNOSIS — R001 Bradycardia, unspecified: Secondary | ICD-10-CM | POA: Diagnosis present

## 2017-08-17 DIAGNOSIS — I7102 Dissection of abdominal aorta: Secondary | ICD-10-CM | POA: Diagnosis present

## 2017-08-17 DIAGNOSIS — I71019 Dissection of thoracic aorta, unspecified: Secondary | ICD-10-CM | POA: Diagnosis present

## 2017-08-17 DIAGNOSIS — Z87891 Personal history of nicotine dependence: Secondary | ICD-10-CM | POA: Diagnosis not present

## 2017-08-17 DIAGNOSIS — E785 Hyperlipidemia, unspecified: Secondary | ICD-10-CM | POA: Diagnosis present

## 2017-08-17 DIAGNOSIS — I482 Chronic atrial fibrillation: Secondary | ICD-10-CM | POA: Diagnosis present

## 2017-08-17 DIAGNOSIS — T148XXA Other injury of unspecified body region, initial encounter: Secondary | ICD-10-CM | POA: Diagnosis present

## 2017-08-17 DIAGNOSIS — E7800 Pure hypercholesterolemia, unspecified: Secondary | ICD-10-CM | POA: Diagnosis present

## 2017-08-17 DIAGNOSIS — I4891 Unspecified atrial fibrillation: Secondary | ICD-10-CM

## 2017-08-17 DIAGNOSIS — I719 Aortic aneurysm of unspecified site, without rupture: Secondary | ICD-10-CM

## 2017-08-17 DIAGNOSIS — I361 Nonrheumatic tricuspid (valve) insufficiency: Secondary | ICD-10-CM | POA: Diagnosis not present

## 2017-08-17 DIAGNOSIS — D693 Immune thrombocytopenic purpura: Secondary | ICD-10-CM | POA: Diagnosis present

## 2017-08-17 DIAGNOSIS — I7101 Dissection of thoracic aorta: Secondary | ICD-10-CM | POA: Diagnosis present

## 2017-08-17 HISTORY — DX: Other injury of unspecified body region, initial encounter: T14.8XXA

## 2017-08-17 HISTORY — DX: Dissection of thoracic aorta, unspecified: I71.019

## 2017-08-17 HISTORY — DX: Aortic aneurysm of unspecified site, without rupture: I71.9

## 2017-08-17 HISTORY — DX: Dissection of thoracic aorta: I71.01

## 2017-08-17 LAB — CORTISOL: Cortisol, Plasma: 12.8 ug/dL

## 2017-08-17 LAB — COMPREHENSIVE METABOLIC PANEL
ALT: 27 U/L (ref 17–63)
AST: 47 U/L — AB (ref 15–41)
Albumin: 3.3 g/dL — ABNORMAL LOW (ref 3.5–5.0)
Alkaline Phosphatase: 27 U/L — ABNORMAL LOW (ref 38–126)
Anion gap: 14 (ref 5–15)
BUN: 22 mg/dL — AB (ref 6–20)
CHLORIDE: 102 mmol/L (ref 101–111)
CO2: 24 mmol/L (ref 22–32)
CREATININE: 1.1 mg/dL (ref 0.61–1.24)
Calcium: 8.9 mg/dL (ref 8.9–10.3)
GFR calc Af Amer: >60 mL/min (ref >60)
Glucose, Bld: 103 mg/dL — ABNORMAL HIGH (ref 65–99)
Potassium: 4.1 mmol/L (ref 3.5–5.1)
Sodium: 140 mmol/L (ref 135–145)
Total Bilirubin: 1.6 mg/dL — ABNORMAL HIGH (ref 0.3–1.2)
Total Protein: 6.3 g/dL — ABNORMAL LOW (ref 6.5–8.1)

## 2017-08-17 LAB — AMYLASE: AMYLASE: 45 U/L (ref 28–100)

## 2017-08-17 LAB — LACTIC ACID, PLASMA: Lactic Acid, Venous: 4.5 mmol/L (ref 0.5–1.9)

## 2017-08-17 LAB — SURGICAL PCR SCREEN
MRSA, PCR: NEGATIVE
Staphylococcus aureus: NEGATIVE

## 2017-08-17 LAB — CBC
HEMATOCRIT: 51.9 % (ref 39.0–52.0)
Hemoglobin: 17.4 g/dL — ABNORMAL HIGH (ref 13.0–17.0)
MCH: 30.1 pg (ref 26.0–34.0)
MCHC: 33.5 g/dL (ref 30.0–36.0)
MCV: 89.8 fL (ref 78.0–100.0)
Platelets: 91 10*3/uL — ABNORMAL LOW (ref 150–400)
RBC: 5.78 MIL/uL (ref 4.22–5.81)
RDW: 15.6 % — ABNORMAL HIGH (ref 11.5–15.5)
WBC: 24.2 10*3/uL — AB (ref 4.0–10.5)

## 2017-08-17 LAB — PROTIME-INR
INR: 1.02
Prothrombin Time: 13.3 seconds (ref 11.4–15.2)

## 2017-08-17 LAB — APTT: APTT: 21 s — AB (ref 24–36)

## 2017-08-17 LAB — TROPONIN I: Troponin I: <0.03 ng/mL (ref <0.03)

## 2017-08-17 LAB — STREP PNEUMONIAE URINARY ANTIGEN: STREP PNEUMO URINARY ANTIGEN: NEGATIVE

## 2017-08-17 LAB — MAGNESIUM: Magnesium: 1.8 mg/dL (ref 1.7–2.4)

## 2017-08-17 LAB — PROCALCITONIN: Procalcitonin: 0.24 ng/mL

## 2017-08-17 LAB — LIPASE, BLOOD: LIPASE: 24 U/L (ref 11–51)

## 2017-08-17 LAB — PHOSPHORUS: Phosphorus: 3.6 mg/dL (ref 2.5–4.6)

## 2017-08-17 MED ORDER — ESMOLOL HCL-SODIUM CHLORIDE 2000 MG/100ML IV SOLN
25.0000 ug/kg/min | INTRAVENOUS | Status: DC
  Administered 2017-08-17: 25 ug/kg/min via INTRAVENOUS
  Filled 2017-08-17: qty 100

## 2017-08-17 MED ORDER — LABETALOL HCL 5 MG/ML IV SOLN
0.5000 mg/min | INTRAVENOUS | Status: DC
  Administered 2017-08-17 (×2): 0.5 mg/min via INTRAVENOUS
  Filled 2017-08-17 (×4): qty 100

## 2017-08-17 MED ORDER — SODIUM CHLORIDE 0.9 % IV SOLN
INTRAVENOUS | Status: DC
  Administered 2017-08-17: 12:00:00 via INTRAVENOUS

## 2017-08-17 MED ORDER — ESMOLOL HCL-SODIUM CHLORIDE 2000 MG/100ML IV SOLN
25.0000 ug/kg/min | INTRAVENOUS | Status: AC
  Filled 2017-08-17: qty 100

## 2017-08-17 MED ORDER — PANTOPRAZOLE SODIUM 40 MG IV SOLR
40.0000 mg | Freq: Every day | INTRAVENOUS | Status: DC
  Administered 2017-08-17 – 2017-08-19 (×3): 40 mg via INTRAVENOUS
  Filled 2017-08-17 (×3): qty 40

## 2017-08-17 MED ORDER — ONDANSETRON HCL 4 MG/2ML IJ SOLN: 4.0000 mg | Freq: Four times a day (QID) | INTRAMUSCULAR | Status: DC | PRN

## 2017-08-17 MED ORDER — FENTANYL CITRATE (PF) 100 MCG/2ML IJ SOLN
25.0000 ug | INTRAMUSCULAR | Status: DC | PRN
  Administered 2017-08-17 (×2): 50 ug via INTRAVENOUS
  Administered 2017-08-18 (×2): 25 ug via INTRAVENOUS
  Filled 2017-08-17 (×4): qty 2

## 2017-08-17 MED ORDER — ESMOLOL HCL-SODIUM CHLORIDE 2000 MG/100ML IV SOLN: 25.0000 ug/kg/min | INTRAVENOUS | Status: DC

## 2017-08-17 MED ORDER — SODIUM CHLORIDE 0.9 % IV SOLN: 250.0000 mL | INTRAVENOUS | Status: DC | PRN

## 2017-08-17 MED ORDER — HYDRALAZINE HCL 20 MG/ML IJ SOLN: 10.0000 mg | Freq: Four times a day (QID) | INTRAMUSCULAR | Status: DC | PRN

## 2017-08-17 NOTE — H&P (Addendum)
PULMONARY / CRITICAL CARE MEDICINE   Name: Jerome Sullivan MRN: [id number] DOB: [birth year]    ADMISSION DATE:  08/17/2017   REFERRING MD: Vascular surgery  CHIEF COMPLAINT: Abdominal pain  HISTORY OF PRESENT ILLNESS:    Mr. Jerome Sullivan is a 77 year old male, remote smoker quit in his 1s, known hypertension, known atrial fibrillation followed by Dr. Cleatrice Burke.  He has been on Xarelto in the past developed thrombocytopenia and was actually evaluated by the cancer clinic idiopathic thrombocytopenia was treated with steroids, platelets are now 129.  Clear to be any cancer or hematological reason for his thrombocytopenia.  He developed diarrhea abdominal pain and back pain approximately 2 weeks ago.  Diarrhea cleared after 1 week of abdominal back pain continued.  He notes the pain was so bad that he sought medical treatment on 08/17/2017.  Reported to have aortic aneurysm with possible dissection.  This was at [facility] and aspirin [place].  He was transported to [facility] to the pulmonary critical care service with CVT S surgery to evaluate for possible repair of aortic aneurysm.  Time of admission is awake alert in no acute distress.  Less than 140.  Reports that his pain is better.   PAST MEDICAL HISTORY :  He  has a past medical history of A-fib ([place]), HTN (hypertension), and Hypercholesteremia. Rheumatoid arthritis ITP Aortic aneurysm PAST SURGICAL HISTORY: He  has no past surgical history on file.  No Known Allergies  No current facility-administered medications on file prior to encounter.    Current Outpatient Medications on File Prior to Encounter  Medication Sig  . CALCIUM PO Take 1,200 mg by mouth 2 (two) times daily.    . cetirizine (ZYRTEC) 10 MG tablet Take 10 mg by mouth daily.  . Cholecalciferol (VITAMIN D3) 2000 UNITS TABS Take 1,000 mg by mouth daily.   . Coenzyme Q10 (CO Q 10 PO) Take 1 tablet by mouth 2 (two) times daily.   .  hydrochlorothiazide (HYDRODIURIL) 25 MG tablet TAKE 1 TABLET BY MOUTH ONCE DAILY  . magnesium gluconate (MAGONATE) 500 MG tablet Take 500 mg by mouth daily.  . metoprolol (LOPRESSOR) 50 MG tablet TAKE 1/2 TABLET BY MOUTH TWICE DAILY  . potassium chloride SA (K-DUR,KLOR-CON) 20 MEQ tablet TAKE 1 TABLET BY MOUTH ONCE DAILY  . rosuvastatin (CRESTOR) 20 MG tablet Take 20 mg by mouth daily.  Jerome Sullivan 20 MG TABS tablet TAKE 1 TABLET BY MOUTH DAILY    FAMILY HISTORY:  His indicated that his mother is deceased. He indicated that his father is deceased. He indicated that his sister is alive. He indicated that his maternal grandmother is deceased. He indicated that his maternal grandfather is deceased. He indicated that his paternal grandmother is deceased. He indicated that his paternal grandfather is deceased.   SOCIAL HISTORY: He  reports that he quit smoking about 39 years ago. His smoking use included cigarettes. he has never used smokeless tobacco. He reports that he does not drink alcohol or use drugs.  REVIEW OF SYSTEMS:   10 point review of system taken, please see HPI for positives and negatives.   SUBJECTIVE:  77 year old male who arrives via ambulance from [facility] who is awake alert in no acute distress  VITAL SIGNS: BP 94/76   Pulse 76   Resp 17   SpO2 96%   HEMODYNAMICS:       INTAKE / OUTPUT: No intake/output data recorded.  PHYSICAL EXAMINATION: General: Well-nourished  well-developed 77 year old who is mentally alert and in no acute distress.  Mildly diaphoretic Neuro: Intact pupils equal round reactive to light 5 out of 5 motor strength in all 4 extremities alert awake oriented x3 HEENT: No JVD or lymphadenopathy is appreciated Cardiovascular: Heart sounds are irregular with a controlled ventricular rate of 86 Lungs: Decreased breath sounds at the base Abdomen: Abdomen tender but with positive bowel sounds Musculoskeletal: Noted to be missing a  finger on the right hand positive pulses in all 4 extremities strong femoral pulses Skin: Warm and clammy  LABS:  BMET No results for input(s): NA, K, CL, CO2, BUN, CREATININE, GLUCOSE in the last 168 hours.  Electrolytes No results for input(s): CALCIUM, MG, PHOS in the last 168 hours.  CBC No results for input(s): WBC, HGB, HCT, PLT in the last 168 hours.  Coag's No results for input(s): APTT, INR in the last 168 hours.  Sepsis Markers No results for input(s): LATICACIDVEN, PROCALCITON, O2SATVEN in the last 168 hours.  ABG No results for input(s): PHART, PCO2ART, PO2ART in the last 168 hours.  Liver Enzymes No results for input(s): AST, ALT, ALKPHOS, BILITOT, ALBUMIN in the last 168 hours.  Cardiac Enzymes No results for input(s): TROPONINI, PROBNP in the last 168 hours.  Glucose No results for input(s): GLUCAP in the last 168 hours.  Imaging No results found.   STUDIES:  08/17/2017 CT angiography chest abdomen and pelvis High density mural thrombus beginning at the level of the arch and continuing throughout the distal aorta.  No definite enhancement of the hematoma.  There is however stranding in the region of the isthmus/ligamentum arteriosum as with leakage.  CULTURES:  ANTIBIOTICS: None  SIGNIFICANT EVENTS: Transferred to [facility] on 08/17/1998  LINES/TUBES:   DISCUSSION: Mr. Jerome Sullivan is a 77 year old male, remote smoker quit in his 55s, known hypertension, known atrial fibrillation followed by Dr. Cleatrice Burke.  He has been on Xarelto in the past developed thrombocytopenia and was actually evaluated by the cancer clinic.  Clear to be any cancer or hematological reason for his thrombocytopenia.  He developed diarrhea abdominal pain and back pain approximately 2 weeks ago.  Diarrhea cleared after 1 week of abdominal back pain continued.  He notes the pain was so bad that he sought medical treatment on 08/17/2017.  Reported to have aortic  aneurysm with possible dissection.  This was at [facility] and aspirin [place].  He was transported to [facility] to the pulmonary critical care service with vascular surgery to evaluate for possible repair of aortic aneurysm.  Time of admission is awake alert in no acute distress.  Less than 140.  Reports that his pain is better. ASSESSMENT / PLAN:  PULMONARY A: Former smoker quit in his 50s P:   O2 as needed If undergoes aortic aneurysm repair most likely on mechanical ventilatory support  CARDIOVASCULAR A:  History of hypertension She of atrial fibrillation for which he was on Xarelto Recent diagnosis of aortic aneurysm with suspected dissection P:  Use of esmolol to keep systolic blood pressure less than 130.  Patient has been switched to IV labetalol.  We will give the patient IV beta-blockers overnight along with as needed hydralazine.  Tomorrow we will start oral medications. Vascular consult for questionable operative interventions Hydralazine as needed. Follow-up echo Patient will need aggressive blood pressure management as an outpatient.  Patient will need to be seen by cardiology prior to discharge.  RENAL Lab Results  Component Value Date   CREATININE 1.16 09/04/2016   CREATININE 1.10 08/31/2015   CREATININE 1.06 03/02/2015   CREATININE 1.12 08/04/2014   A:   No reported issues P:   Follow creatinine in the setting of aortic aneurysm dissection Monitor urine output.  GASTROINTESTINAL A:   Recent abdominal pain Recent diarrhea P:   Antiemetics as needed  HEMATOLOGIC A:   History of chronic anticoagulation Recent evaluation for thrombocytopenia while on Xarelto for atrial fibrillation Rheumatoid arthritis P:  Follow hemoglobin and platelets If platelets currently at 150.  No clinical indication of bleeding at this time.  Hemoglobin is stable.  INFECTIOUS A:   No overt source of infection  recent history of diarrhea No  catheter leukocytosis secondary to reactive.   P:   Monitor white count and temperature curve No further infection seen at this time.  Patient will be monitored off of IV antibiotics.  ENDOCRINE CBG (last 3)  No results for input(s): GLUCAP in the last 72 hours.   A:   No reported issues P:   Monitor glucose lab data Follow-up TSH  NEUROLOGIC A:   Awake alert no acute distress Mild anxiety P:   RASS goal: 0 Anxiolytics as needed   FAMILY  - Updates: 08/17/2017 patient updated at bedside.  Patient is very well aware of his current condition and the severity.  All questions were answered.  Patient has a very large family who was at bedside.  They are also very well aware of patient current condition.  - Inter-disciplinary family meet or Palliative Care meeting due by:  day 7    App cct 45 min   Jerome Sullivan ACNP Jerome Sullivan PCCM Pager [phone] till 1 pm If no answer page [phone] 08/17/2017, 11:24 AM  Jerome Sullivan Mon Pulmonary Critical Care Pager: [phone]

## 2017-08-17 NOTE — Consult Note (Signed)
Cardiology Consultation:   Patient ID: SUMMIT ARROYAVE; [id number]; October 02, 1940   Admit date: 08/17/2017 Date of Consult: 08/17/2017  Primary Care Provider: Cyndy Freeze, MD Primary Cardiologist: Mickenzie Stolar  Primary Electrophysiologist:     Patient Profile:   Jerome Sullivan is a 77 y.o. male with a hx of atrial fibrillation, hypertension, hyperlipidemia  who is being seen today for the evaluation of type B aortic dissection at the request of Dr. Roxy Manns and Dr. Elsworth Soho.  History of Present Illness:   Jerome Sullivan is a 77 year old gentleman who have known for years.  He has a history of chronic atrial fibrillation, hypertension hyperlipidemia. I last saw him Jesiel approximately 1 year ago.  He was doing well at that time.  He has remained active.  He still enjoys eating some extra salt.  He was recently seen for thrombocytopenia.  His anticoagulation was stopped at that time.  He has been seen in consultation by hematology oncology.  Several weeks ago he developed some abdominal pain diarrhea and some back pain.  The diarrhea resolved but the back pain and abdominal pain persisted.  The pain became so bad this morning that he went to [facility] for further evaluation.  He was found to have a type B aortic dissection by CT scan.  He was markedly hypertensive.  Has a history of smoking in the remote past.  He denies any fevers or chills although he is fairly warm now. Continues to eat more salt than he should.  He denies any tingling or numbness.  He denies any neurologic symptoms.  He denies any chest pain or shortness of breath at this time.  Past Medical History:  Diagnosis Date  . A-fib [facility])    failed cardioversion  . Aortic dissection distal to left subclavian ([place]) 08/17/2017  . HTN (hypertension)   . Hypercholesteremia   . Intramural hematoma of descending thoracic and abdominal aorta 08/17/2017    History reviewed. No pertinent surgical history.   Home Medications:  Prior to  Admission medications   Medication Sig Start Date End Date Taking? Authorizing Provider  CALCIUM PO Take 1,200 mg by mouth 2 (two) times daily.      [provider]  cetirizine (ZYRTEC) 10 MG tablet Take 10 mg by mouth daily.    [provider]  Cholecalciferol (VITAMIN D3) 2000 UNITS TABS Take 1,000 mg by mouth daily.     [provider]  Coenzyme Q10 (CO Q 10 PO) Take 1 tablet by mouth 2 (two) times daily.     [provider]  hydrochlorothiazide (HYDRODIURIL) 25 MG tablet TAKE 1 TABLET BY MOUTH ONCE DAILY 10/09/16   Vallie Teters, Wonda Cheng, MD  magnesium gluconate (MAGONATE) 500 MG tablet Take 500 mg by mouth daily.    [provider]  metoprolol (LOPRESSOR) 50 MG tablet TAKE 1/2 TABLET BY MOUTH TWICE DAILY 10/09/16   Artavis Cowie, Wonda Cheng, MD  potassium chloride SA (K-DUR,KLOR-CON) 20 MEQ tablet TAKE 1 TABLET BY MOUTH ONCE DAILY 10/09/16   Glynda Soliday, Wonda Cheng, MD  rosuvastatin (CRESTOR) 20 MG tablet Take 20 mg by mouth daily. 08/03/16   [provider]  XARELTO 20 MG TABS tablet TAKE 1 TABLET BY MOUTH DAILY 04/27/17   Tujuana Kilmartin, Wonda Cheng, MD    Inpatient Medications: Scheduled Meds: . pantoprazole (PROTONIX) IV  40 mg Intravenous QHS   Continuous Infusions: . sodium chloride 250 mL (08/17/17 1700)  . labetalol (NORMODYNE) infusion 1 mg/min (08/17/17 1607)   PRN Meds: sodium chloride,  fentaNYL (SUBLIMAZE) injection, hydrALAZINE, ondansetron (ZOFRAN) IV  Allergies:   No Known Allergies  Social History:   Social History   Socioeconomic History  . Marital status: Married    Spouse name: Not on file  . Number of children: Not on file  . Years of education: Not on file  . Highest education level: Not on file  Social Needs  . Financial resource strain: Not very hard  . Food insecurity - worry: Never true  . Food insecurity - inability: Never true  . Transportation needs - medical: No  . Transportation needs - non-medical: No  Occupational History    . Not on file  Tobacco Use  . Smoking status: Former Smoker    Types: Cigarettes    Last attempt to quit: 06/26/1978    Years since quitting: 39.1  . Smokeless tobacco: Never Used  Substance and Sexual Activity  . Alcohol use: No  . Drug use: No  . Sexual activity: Yes    Partners: Female    Birth control/protection: None  Other Topics Concern  . Not on file  Social History Narrative  . Not on file    Family History:    Family History  Problem Relation Age of Onset  . Heart disease Mother   . Heart disease Father   . Hypertension Sister      ROS:  Please see the history of present illness.   All other ROS reviewed and negative.     Physical Exam/Data:   Vitals:   08/17/17 1615 08/17/17 1630 08/17/17 1645 08/17/17 1700  BP: 106/70 (!) 96/44 98/70 103/68  Pulse: 81 65 92 (!) 35  Resp: (!) 22 18  (!) 22  Temp:      TempSrc:      SpO2: 91% (!) 76% 95% 93%  Weight:      Height:        Intake/Output Summary (Last 24 hours) at 08/17/2017 1736 Last data filed at 08/17/2017 1700 Gross per 24 hour  Intake 469.63 ml  Output 550 ml  Net -80.37 ml   Filed Weights   08/17/17 1100  Weight: 195 lb 12.3 oz (88.8 kg)   Body mass index is 26.55 kg/m.  General: Elderly gentleman, no acute distress. HEENT: normal Lymph: no adenopathy Neck: no JVD Endocrine:  No thryomegaly Vascular: No carotid bruits; FA pulses 2+ bilaterally without bruits  Cardiac: Irregularly irregular.  He has a soft systolic murmur. Lungs:  clear to auscultation bilaterally, no wheezing, rhonchi or rales  Abd: soft, nontender, no hepatomegaly  Ext: no edema Musculoskeletal:  No deformities, BUE and BLE strength normal and equal Skin: warm and dry  Neuro:  CNs 2-12 intact, no focal abnormalities noted Psych:  Normal affect   EKG:  The EKG was personally reviewed and demonstrates: Atrial fibrillation with a controlled ventricular response.  He has no ST or T wave changes. Telemetry:  Telemetry  was personally reviewed and demonstrates: Atrial for ablation with controlled ventricular response  Relevant CV Studies:   Laboratory Data:  Chemistry Recent Labs  Lab 08/17/17 1349  NA 140  K 4.1  CL 102  CO2 24  GLUCOSE 103*  BUN 22*  CREATININE 1.10  CALCIUM 8.9  GFRNONAA >60  GFRAA >60  ANIONGAP 14    Recent Labs  Lab 08/17/17 1349  PROT 6.3*  ALBUMIN 3.3*  AST 47*  ALT 27  ALKPHOS 27*  BILITOT 1.6*   Hematology Recent Labs  Lab 08/17/17 1349  WBC  24.2*  RBC 5.78  HGB 17.4*  HCT 51.9  MCV 89.8  MCH 30.1  MCHC 33.5  RDW 15.6*  PLT 91*   Cardiac Enzymes Recent Labs  Lab 08/17/17 1349  TROPONINI <0.03   No results for input(s): TROPIPOC in the last 168 hours.  BNPNo results for input(s): BNP, PROBNP in the last 168 hours.  DDimer No results for input(s): DDIMER in the last 168 hours.  Radiology/Studies:  No results found.  Assessment and Plan:   1. 1.  Thoracic aortic dissection: The patient has a type B aortic dissection.  He has been seen by Dr. Roxy Manns.  [facility] scan obtained at [facility] reveals moderate aneurysmal dilatation of the entire descending aorta.  There is an intramural hematoma at the level of the left subclavian artery extending to the bifurcation of the infrarenal aorta.  He is been started on IV labetalol and his blood pressure is much better. We will continue to aggressively treat his blood pressure.  He will need to be reminded of salt restriction.  2.  Atrial fibrillation: The patient has a history of chronic atrial fibrillation.  He is off of Xarelto.  At this point we will not be able to restart the Xarelto because of this aortic dissection.  We will not be able to restart any oral anticoagulation until we verify that his aortic dissection is stable and his blood pressure is  stable.   For questions or updates, please contact [place] Please consult [email or web address] for contact info under Cardiology/STEMI.    Signed, Mertie Moores, MD  08/17/2017 5:36 PM

## 2017-08-17 NOTE — Consult Note (Signed)
Hospital Consult    Reason for Consult:  Thoracic imh Referring Physician:  Critical care MRN #:  [id number]  History of Present Illness: This is a 77 y.o. male with history of atrial fibrillation maintained on Xarelto although he has been off for a few weeks for thrombocytopenia.  He also has hypertension for which he takes a beta-blocker and HCTZ.  He presented with abdominal pain radiating up into his chest to the [facility] emergency department had a CT Angie with concern of dissection.  At the time of admission his blood pressure was greater than 270 systolic.  He now states that he is much more comfortable belly pain is improving does not have any lower extremity symptoms or pain.  He has never had aortic pathology and the past.  Only surgery in the past includes back surgery and he has a traumatic right middle finger amputation.  He is a former smoker having quit 30 years ago.  Past Medical History:  Diagnosis Date  . A-fib [facility])    failed cardioversion  . HTN (hypertension)   . Hypercholesteremia     Surgery: back surgery only  No Known Allergies  Prior to Admission medications   Medication Sig Start Date End Date Taking? Authorizing Provider  CALCIUM PO Take 1,200 mg by mouth 2 (two) times daily.      [provider]  cetirizine (ZYRTEC) 10 MG tablet Take 10 mg by mouth daily.    [provider]  Cholecalciferol (VITAMIN D3) 2000 UNITS TABS Take 1,000 mg by mouth daily.     [provider]  Coenzyme Q10 (CO Q 10 PO) Take 1 tablet by mouth 2 (two) times daily.     [provider]  hydrochlorothiazide (HYDRODIURIL) 25 MG tablet TAKE 1 TABLET BY MOUTH ONCE DAILY 10/09/16   Nahser, Wonda Cheng, MD  magnesium gluconate (MAGONATE) 500 MG tablet Take 500 mg by mouth daily.    [provider]  metoprolol (LOPRESSOR) 50 MG tablet TAKE 1/2 TABLET BY MOUTH TWICE DAILY 10/09/16   Nahser, Wonda Cheng, MD  potassium chloride SA (K-DUR,KLOR-CON) 20 MEQ  tablet TAKE 1 TABLET BY MOUTH ONCE DAILY 10/09/16   Nahser, Wonda Cheng, MD  rosuvastatin (CRESTOR) 20 MG tablet Take 20 mg by mouth daily. 08/03/16   [provider]  XARELTO 20 MG TABS tablet TAKE 1 TABLET BY MOUTH DAILY 04/27/17   Nahser, Wonda Cheng, MD    Social History   Socioeconomic History  . Marital status: Married    Spouse name: Not on file  . Number of children: Not on file  . Years of education: Not on file  . Highest education level: Not on file  Social Needs  . Financial resource strain: Not on file  . Food insecurity - worry: Not on file  . Food insecurity - inability: Not on file  . Transportation needs - medical: Not on file  . Transportation needs - non-medical: Not on file  Occupational History  . Not on file  Tobacco Use  . Smoking status: Former Smoker    Types: Cigarettes    Last attempt to quit: 06/26/1978    Years since quitting: 39.1  . Smokeless tobacco: Never Used  Substance and Sexual Activity  . Alcohol use: No  . Drug use: No  . Sexual activity: Not on file  Other Topics Concern  . Not on file  Social History Narrative  . Not on file    Family History  Problem Relation Age  of Onset  . Heart disease Mother   . Heart disease Father   . Hypertension Sister     REVIEW OF SYSTEMS (negative unless checked):   Cardiac:  [x]  Chest pain or chest pressure? []  Shortness of breath upon activity? []  Shortness of breath when lying flat? []  Irregular heart rhythm?  Vascular:  []  Pain in calf, thigh, or hip brought on by walking? []  Pain in feet at night that wakes you up from your sleep? []  Blood clot in your veins? []  Leg swelling?  Pulmonary:  []  Oxygen at home? []  Productive cough? []  Wheezing?  Neurologic:  []  Sudden weakness in arms or legs? []  Sudden numbness in arms or legs? []  Sudden onset of difficult speaking or slurred speech? []  Temporary loss of vision in one eye? []  Problems with dizziness?  Gastrointestinal:  [x]   abdominal pain []  Vomited blood?  Genitourinary:  []  Burning when urinating? []  Blood in urine?  Psychiatric:  []  Major depression  Hematologic:  []  Bleeding problems? []  Problems with blood clotting?  Dermatologic:  []  Rashes or ulcers?  Constitutional:  []  Fever or chills?  Ear/Nose/Throat:  []  Change in hearing? []  Nose bleeds? []  Sore throat?  Musculoskeletal:  []  Back pain? []  Joint pain? []  Muscle pain?    Physical Examination  Vitals:   08/17/17 1300 08/17/17 1315  BP: (!) 130/95 140/89  Pulse: 95 73  Resp: (!) 9 17  Temp:    SpO2: 98% 94%   Body mass index is 26.55 kg/m.  General:  WDWN in NAD HENT: WNL, normocephalic Pulmonary: normal non-labored breathing Cardiac: palpable radial, femoral, popliteal and pedal pulses Irregular rhythm Abdomen: soft, NT/ND, no masses Extremities: warm and well perfused Neurologic: A&O X 3 ; SENSATION: normal; MOTOR FUNCTION:  moving all extremities equally. Speech is fluent/normal Psychiatric:  Appropriate mood and affect  CBC    Component Value Date/Time   WBC 6.6 09/04/2016 0954   WBC 5.3 08/31/2015 1007   RBC 5.66 09/04/2016 0954   RBC 5.80 08/31/2015 1007   HGB 17.1 09/04/2016 0954   HCT 47.9 09/04/2016 0954   PLT 155 09/04/2016 0954   MCV 85 09/04/2016 0954   MCH 30.2 09/04/2016 0954   MCH 28.6 08/31/2015 1007   MCHC 35.7 09/04/2016 0954   MCHC 34.7 08/31/2015 1007   RDW 13.8 09/04/2016 0954   LYMPHSABS 1.5 09/04/2016 0954   MONOABS 0.5 08/31/2015 1007   EOSABS 0.1 09/04/2016 0954   BASOSABS 0.0 09/04/2016 0954    BMET    Component Value Date/Time   NA 141 09/04/2016 0954   K 3.6 09/04/2016 0954   CL 95 (L) 09/04/2016 0954   CO2 30 (H) 09/04/2016 0954   GLUCOSE 87 09/04/2016 0954   GLUCOSE 81 08/31/2015 1007   BUN 15 09/04/2016 0954   CREATININE 1.16 09/04/2016 0954   CREATININE 1.10 08/31/2015 1007   CALCIUM 9.6 09/04/2016 0954   GFRNONAA 62 09/04/2016 0954   GFRAA 72 09/04/2016  0954    COAGS: No results found for: INR, PROTIME   Non-Invasive Vascular Imaging:   IMPRESSION: Findings of acute aortic syndrome with stranding around the aneurysmal arch and proximal descending segment consistent with leakage. High-density wall thickening of the aorta beginning at the arch and continuing to the terminus. This could reflect thrombosed dissection or acute intramural hematoma, extent favoring the former. Mural hematoma/dissection continues into the proximal celiac, SMA, and orthotopic renal arteries - no evidence of end-organ ischemia.     ASSESSMENT/PLAN: This  is a 76 y.o. male here with descending thoracic intramural hematoma.  There is not appear to be any endorgan ischemia he has palpable distal pulses and his pain is improving.  His aorta at the level of the pulmonary artery is approximately 48 mm in the IMH thickness is approximately 15 mm.  For now he needs impulse control with his blood pressure goal systolic less than 810 and heart rate less than 80.  His pain can be controlled along with his blood pressure we can hopefully get him out of the hospital on p.o. medications with plan for repeat CT scan in 4-6 weeks to follow the progress.  I have discussed possible outcomes including the possible need for consideration of thoracic endograft doing in the future given the current size.  He along with his family demonstrate very good understanding.   Brandon C. Donzetta Matters, MD [facility] Office: [phone] Pager: [phone]

## 2017-08-17 NOTE — Consult Note (Addendum)
[address]       [place] [address]             [phone]        Sumedh C Posa [place] Medical Record #[id number] Date of Birth: 04/16/1941  Referring: No ref. provider found Primary Care: Cyndy Freeze, MD Primary Cardiologist:No primary care provider on file.  Chief Complaint: Type B Aortic Dissection  History of Present Illness:      Mr. Colucci is a 77 yo white male with known history of Hypertension and Atrial Fibrillation followed by Dr. Acie Fredrickson.  He was previously on Xarelto for stroke prevention, but developed thrombocytopenia.  Due to this Xarelto was discontinued.  He underwent evaluation by Heme/Onc which did not reveal a cancer or hematological reason for his low platelets.  He has been treated with steroids with some improvement in his platelet count.  The patient developed complaints of diarrhea, abdominal pain, and back pain approximately 2 weeks ago.  The diarrhea resolved, but his back and abdominal pain persisted.  The pain was so severe today it prompted the patient to present to [facility] ED for evaluation.  CT scan was obtained and revealed a Type B Aortic Dissection.  He has been transferred to [facility] health for further care.  Currently the patient is diaphoretic.  He continues to have discomfort which he rates at a 5-6.  He also had some brief nausea which has resolved.  He denies current smoking, but did smoke about 40 years ago.  He was compliant with his BP meds until recently.  He states they were discontinued due to his low platelet count.    Current Activity/ Functional Status: Patient was independent with mobility/ambulation, transfers, ADL's, IADL's.   Zubrod Score: At the time of surgery this patient's most appropriate activity status/level should be described as: []     0    Normal activity, no symptoms []     1    Restricted in physical strenuous activity but ambulatory, able to do out light work []     2    Ambulatory and  capable of self care, unable to do work activities, up and about                 more than 50%  Of the time                            []     3    Only limited self care, in bed greater than 50% of waking hours []     4    Completely disabled, no self care, confined to bed or chair []     5    Moribund  Past Medical History:  Diagnosis Date  . A-fib [facility])    failed cardioversion  . HTN (hypertension)   . Hypercholesteremia     No past surgical history on file.  Social History   Tobacco Use  Smoking Status Former Smoker  . Types: Cigarettes  . Last attempt to quit: 06/26/1978  . Years since quitting: 39.1  Smokeless Tobacco Never Used    Social History   Substance and Sexual Activity  Alcohol Use No    Social History   Socioeconomic History  . Marital status: Married    Spouse name: Not on file  . Number of children: Not on file  . Years of education: Not on file  . Highest education level:  Not on file  Social Needs  . Financial resource strain: Not on file  . Food insecurity - worry: Not on file  . Food insecurity - inability: Not on file  . Transportation needs - medical: Not on file  . Transportation needs - non-medical: Not on file  Occupational History  . Not on file  Tobacco Use  . Smoking status: Former Smoker    Types: Cigarettes    Last attempt to quit: 06/26/1978    Years since quitting: 39.1  . Smokeless tobacco: Never Used  Substance and Sexual Activity  . Alcohol use: No  . Drug use: No  . Sexual activity: Not on file  Other Topics Concern  . Not on file  Social History Narrative  . Not on file    No Known Allergies  Current Facility-Administered Medications  Medication Dose Route Frequency Provider Last Rate Last Dose  . 0.9 %  sodium chloride infusion  250 mL Intravenous PRN Minor, Grace Bushy, NP      . 0.9 %  sodium chloride infusion   Intravenous Continuous Minor, Grace Bushy, NP 75 mL/hr at 08/17/17 1200    . esmolol (BREVIBLOC) 2000 mg / 100  mL (20 mg/mL) infusion  25-300 mcg/kg/min (Order-Specific) Intravenous Titrated Kara Mead V, MD 13.4 mL/hr at 08/17/17 1210 50 mcg/kg/min at 08/17/17 1210  . fentaNYL (SUBLIMAZE) injection 25-100 mcg  25-100 mcg Intravenous Q2H PRN Minor, Grace Bushy, NP      . ondansetron (ZOFRAN) injection 4 mg  4 mg Intravenous Q6H PRN Minor, Grace Bushy, NP      . pantoprazole (PROTONIX) injection 40 mg  40 mg Intravenous QHS Minor, Grace Bushy, NP        Medications Prior to Admission  Medication Sig Dispense Refill Last Dose  . CALCIUM PO Take 1,200 mg by mouth 2 (two) times daily.     Taking  . cetirizine (ZYRTEC) 10 MG tablet Take 10 mg by mouth daily.   Taking  . Cholecalciferol (VITAMIN D3) 2000 UNITS TABS Take 1,000 mg by mouth daily.    Taking  . Coenzyme Q10 (CO Q 10 PO) Take 1 tablet by mouth 2 (two) times daily.    Taking  . hydrochlorothiazide (HYDRODIURIL) 25 MG tablet TAKE 1 TABLET BY MOUTH ONCE DAILY 90 tablet 3   . magnesium gluconate (MAGONATE) 500 MG tablet Take 500 mg by mouth daily.   Taking  . metoprolol (LOPRESSOR) 50 MG tablet TAKE 1/2 TABLET BY MOUTH TWICE DAILY 90 tablet 3   . potassium chloride SA (K-DUR,KLOR-CON) 20 MEQ tablet TAKE 1 TABLET BY MOUTH ONCE DAILY 90 tablet 3   . rosuvastatin (CRESTOR) 20 MG tablet Take 20 mg by mouth daily.   Taking  . XARELTO 20 MG TABS tablet TAKE 1 TABLET BY MOUTH DAILY 30 tablet 5     Family History  Problem Relation Age of Onset  . Heart disease Mother   . Heart disease Father   . Hypertension Sister      Review of Systems:   ROS Constitutional: positive for sweats Eyes: negative for visual disturbance Respiratory: negative Cardiovascular: positive for chest pressure/discomfort Gastrointestinal: positive for abdominal pain and nausea Musculoskeletal:positive for back pain Neurological: negative     Cardiac Review of Systems: Y or  [    ]= no  Chest Pain [ Y   ]  Resting SOB [ N  ] Exertional SOB  [  ]  Orthopnea [  ]   Pedal  Edema [  N  ]    Palpitations Jerome Sullivan  ] Syncope  [  ]   Presyncope [   ]  General Review of Systems: [Y] = yes [  ]=no Constitional: recent weight change [  ]; anorexia [  ]; fatigue [  ]; nausea [  ]; night sweats [  ]; fever [  ]; or chills [  ]                                                               Dental: poor dentition[  ]; Last Dentist visit:   Eye : blurred vision [  ]; diplopia [   ]; vision changes [  ];  Amaurosis fugax[  ]; Resp: cough [ N ];  wheezing[  ];  hemoptysis[ N ]; shortness of breath[N  ]; paroxysmal nocturnal dyspnea[  ]; dyspnea on exertion[N  ]; or orthopnea[  ];  GI:  gallstones[  ], vomiting[N  ];  dysphagia[  ]; melena[  ];  hematochezia [  ]; heartburn[  ];   Hx of  Colonoscopy[  ]; GU: kidney stones [  ]; hematuria[  ];   dysuria [  ];  nocturia[  ];  history of     obstruction [  ]; urinary frequency [  ]             Skin: rash, swelling[  ];, hair loss[  ];  peripheral edema[  ];  or itching[  ]; Musculosketetal: myalgias[  ];  joint swelling[  ];  joint erythema[  ];  joint pain[  ];  back pain[ Y ];  Heme/Lymph: bruising[  ];  bleeding[  ];  anemia[  ];  Neuro: TIA[ N ];  headaches[  ];  stroke[N  ];  vertigo[  ];  seizures[  ];   paresthesias[  ];  difficulty walking[  ];  Psych:depression[  ]; anxiety[  ];  Endocrine: diabetes[  ];  thyroid dysfunction[  ];  Immunizations: Flu [  ]; Pneumococcal[  ];   Physical Exam: BP (!) 129/93   Pulse (!) 42   Temp 97.7 F (36.5 C) (Oral)   Resp 17   Ht 6' (1.829 m)   Wt 195 lb 12.3 oz (88.8 kg)   SpO2 99%   BMI 26.55 kg/m    General appearance: alert, cooperative and no distress Head: Normocephalic, without obvious abnormality, atraumatic Neck: no adenopathy, no carotid bruit, no JVD, supple, symmetrical, trachea midline and thyroid not enlarged, symmetric, no tenderness/mass/nodules Resp: clear to auscultation bilaterally Cardio: irregularly irregular rhythm GI: soft, non-tender; bowel sounds normal; no  masses,  no organomegaly Extremities: extremities normal, atraumatic, no cyanosis or edema Neurologic: Alert and oriented X 3, normal strength and tone. Normal symmetric reflexes. Normal coordination and gait  Diagnostic Studies & Laboratory data:     Recent Radiology Findings:   No results found.   I have independently reviewed the above radiologic studies.  Recent Lab Findings: Lab Results  Component Value Date   WBC 6.6 09/04/2016   HGB 17.1 09/04/2016   HCT 47.9 09/04/2016   PLT 155 09/04/2016   GLUCOSE 87 09/04/2016   CHOL 145 09/04/2016   TRIG 173 (H) 09/04/2016   HDL 33 (L) 09/04/2016   LDLCALC 77 09/04/2016  ALT 19 09/04/2016   AST 26 09/04/2016   NA 141 09/04/2016   K 3.6 09/04/2016   CL 95 (L) 09/04/2016   CREATININE 1.16 09/04/2016   BUN 15 09/04/2016   CO2 30 (H) 09/04/2016    Assessment / Plan:    1. Type B Aortic Dissection- no acute surgical intervention indicated at this time 2. Hypertension- would d/c Esmolol as patient is having some episodes of low HR.  Start Labetalol and titrate as needed.  Will also add Hydralazine prn for additional BP control 3. Atrial Fibrillation- rate controlled, not currently on anticoagulation 4. ITP- most recent platelet count is 155 5. Pain control- IV Fentanyl is ordered per CCM 6. Dispo- Type B Aortic Dissection, acute surgical intervention is not indicated.  Good Blood pressure control is imperative.. Have d/c Esmolol and started Labetalol, Dr. Roxy Manns is aware of patient and will evaluate this afternoon.   Ellwood Handler, PA-C  08/17/2017 1:15 PM    I have seen and examined the patient and agree with the assessment and plan as outlined.   Patient is a 77 year old male with history of hypertension and persistent atrial fibrillation not currently on anticoagulation who presents with acute abdominal pain radiating to the back.  I have personally reviewed the CT angiogram performed at [facility] which demonstrates mild  to moderate aneurysmal enlargement of the entire descending thoracic aorta with an acute intramural hematoma beginning at the level of the left subclavian artery and extending all the way to the bifurcation of the infrarenal aorta.  There is some soft tissue stranding at the level of the aortic arch but in my opinion the aortic dissection (intramural hematoma) clearly begins at the level of the left subclavian and does not involve the ascending aorta or transverse aortic arch in any significant degree.  At this point treatment should include insertion of radial arterial line to facilitate careful titration of medications for management of hypertension.  I recommend intravenous labetalol infusion as primary mode of therapy initially.  We will continue to follow along with Dr. Donzetta Matters and colleagues from vascular surgery team.    I recommend cardiology consultation as well as ultimately they will need to manage the patient's long-term follow-up with very strict management of blood pressure control.  Furthermore, the patient should not be restarted on oral anticoagulation for atrial fibrillation until follow-up imaging to document stability of the patient's aorta.  Is I discussed matters at length with the patient at the bedside.  All questions answered.   I spent in excess of 60 minutes during the conduct of this hospital encounter and >50% of this time involved direct face-to-face encounter with the patient for counseling and/or coordination of their care.    Rexene Alberts, MD 08/17/2017 3:52 PM

## 2017-08-18 DIAGNOSIS — T148XXA Other injury of unspecified body region, initial encounter: Secondary | ICD-10-CM

## 2017-08-18 LAB — CBC
HEMATOCRIT: 45.1 % (ref 39.0–52.0)
HEMOGLOBIN: 15.1 g/dL (ref 13.0–17.0)
MCH: 30.2 pg (ref 26.0–34.0)
MCHC: 33.5 g/dL (ref 30.0–36.0)
MCV: 90.2 fL (ref 78.0–100.0)
Platelets: 65 10*3/uL — ABNORMAL LOW (ref 150–400)
RBC: 5 MIL/uL (ref 4.22–5.81)
RDW: 16.1 % — ABNORMAL HIGH (ref 11.5–15.5)
WBC: 15.9 10*3/uL — AB (ref 4.0–10.5)

## 2017-08-18 LAB — BASIC METABOLIC PANEL
ANION GAP: 11 (ref 5–15)
BUN: 25 mg/dL — ABNORMAL HIGH (ref 6–20)
CHLORIDE: 103 mmol/L (ref 101–111)
CO2: 23 mmol/L (ref 22–32)
Calcium: 8.4 mg/dL — ABNORMAL LOW (ref 8.9–10.3)
Creatinine, Ser: 1.08 mg/dL (ref 0.61–1.24)
GFR calc non Af Amer: >60 mL/min (ref >60)
Glucose, Bld: 109 mg/dL — ABNORMAL HIGH (ref 65–99)
POTASSIUM: 4.2 mmol/L (ref 3.5–5.1)
Sodium: 137 mmol/L (ref 135–145)

## 2017-08-18 LAB — URINE CULTURE: Culture: <10000 — AB

## 2017-08-18 LAB — PHOSPHORUS: PHOSPHORUS: 4.4 mg/dL (ref 2.5–4.6)

## 2017-08-18 LAB — LIPID PANEL
Cholesterol: 173 mg/dL (ref 0–200)
HDL: 34 mg/dL — ABNORMAL LOW (ref >40)
LDL Cholesterol: 107 mg/dL — ABNORMAL HIGH (ref 0–99)
Total CHOL/HDL Ratio: 5.1 RATIO
Triglycerides: 162 mg/dL — ABNORMAL HIGH (ref <150)
VLDL: 32 mg/dL (ref 0–40)

## 2017-08-18 LAB — LACTIC ACID, PLASMA: Lactic Acid, Venous: 2.5 mmol/L (ref 0.5–1.9)

## 2017-08-18 LAB — TROPONIN I: Troponin I: <0.03 ng/mL (ref <0.03)

## 2017-08-18 LAB — MAGNESIUM: MAGNESIUM: 1.8 mg/dL (ref 1.7–2.4)

## 2017-08-18 LAB — TSH: TSH: 1.755 u[IU]/mL (ref 0.350–4.500)

## 2017-08-18 MED ORDER — LABETALOL HCL 200 MG PO TABS
200.0000 mg | ORAL_TABLET | Freq: Two times a day (BID) | ORAL | Status: DC
  Administered 2017-08-18 – 2017-08-22 (×9): 200 mg via ORAL
  Filled 2017-08-18 (×9): qty 1

## 2017-08-18 NOTE — H&P (Signed)
PULMONARY / CRITICAL CARE MEDICINE   Name: Jerome Sullivan MRN: [id number] DOB: 09-21-1940    ADMISSION DATE:  08/17/2017   REFERRING MD: Vascular surgery  CHIEF COMPLAINT: Abdominal pain  HISTORY OF PRESENT ILLNESS:    Mr. Jerome Sullivan is a 77 year old male, remote smoker quit in his 71s, known hypertension, known atrial fibrillation followed by Dr. Cleatrice Burke.  He has been on Xarelto in the past developed thrombocytopenia and was actually evaluated by the cancer clinic idiopathic thrombocytopenia was treated with steroids, platelets are now 129.  Clear to be any cancer or hematological reason for his thrombocytopenia.  He developed diarrhea abdominal pain and back pain approximately 2 weeks ago.  Diarrhea cleared after 1 week of abdominal back pain continued.  He notes the pain was so bad that he sought medical treatment on 08/17/2017.  Reported to have aortic aneurysm with possible dissection.  This was at [facility] and aspirin [place].  He was transported to [facility] to the pulmonary critical care service with CVT S surgery to evaluate for possible repair of aortic aneurysm.  Time of admission is awake alert in no acute distress.  Less than 140.  Reports that his pain is better.   PAST MEDICAL HISTORY :  He  has a past medical history of A-fib [facility]), Aortic dissection distal to left subclavian ([place]) (08/17/2017), HTN (hypertension), Hypercholesteremia, and Intramural hematoma of descending thoracic and abdominal aorta (08/17/2017). Rheumatoid arthritis ITP Aortic aneurysm PAST SURGICAL HISTORY: He  has no past surgical history on file.  No Known Allergies  No current facility-administered medications on file prior to encounter.    Current Outpatient Medications on File Prior to Encounter  Medication Sig  . acetaminophen (TYLENOL) 325 MG tablet Take 325-650 mg by mouth every 6 (six) hours as needed for mild pain.  Marland Kitchen CALCIUM PO Take 1,200 mg by mouth  2 (two) times daily.    . Cholecalciferol (VITAMIN D3) 2000 UNITS TABS Take 1,000 mg by mouth daily.   . Coenzyme Q10 (CO Q 10 PO) Take 1 tablet by mouth 2 (two) times daily.   . metoprolol (LOPRESSOR) 50 MG tablet TAKE 1/2 TABLET BY MOUTH TWICE DAILY  . omeprazole (PRILOSEC) 40 MG capsule Take 40 mg by mouth daily.  . polyvinyl alcohol (LIQUIFILM TEARS) 1.4 % ophthalmic solution Place 1 drop into both eyes as needed for dry eyes.  . potassium chloride SA (K-DUR,KLOR-CON) 20 MEQ tablet TAKE 1 TABLET BY MOUTH ONCE DAILY (Patient taking differently: take 1 tablet twice a day)  . predniSONE (DELTASONE) 20 MG tablet Take 20 mg by mouth 2 (two) times daily with a meal.  . simvastatin (ZOCOR) 40 MG tablet Take 40 mg by mouth daily.  Alveda Reasons 20 MG TABS tablet TAKE 1 TABLET BY MOUTH DAILY  . cetirizine (ZYRTEC) 10 MG tablet Take 10 mg by mouth daily.  . hydrochlorothiazide (HYDRODIURIL) 25 MG tablet TAKE 1 TABLET BY MOUTH ONCE DAILY  . magnesium gluconate (MAGONATE) 500 MG tablet Take 500 mg by mouth daily.  . rosuvastatin (CRESTOR) 20 MG tablet Take 20 mg by mouth daily.    FAMILY HISTORY:  His indicated that his mother is deceased. He indicated that his father is deceased. He indicated that his sister is alive. He indicated that his maternal grandmother is deceased. He indicated that his maternal grandfather is deceased. He indicated that his paternal grandmother is deceased. He indicated that his paternal grandfather is deceased.   SOCIAL  HISTORY: He  reports that he quit smoking about 39 years ago. His smoking use included cigarettes. he has never used smokeless tobacco. He reports that he does not drink alcohol or use drugs.  REVIEW OF SYSTEMS:   10 point review of system taken, please see HPI for positives and negatives.   SUBJECTIVE:  77 year old male who arrives via ambulance from [facility] who is awake alert in no acute distress  VITAL SIGNS: BP 130/86   Pulse (!)  106   Temp 98.3 F (36.8 C) (Oral)   Resp 18   Ht 6' (1.829 m)   Wt 192 lb 14.4 oz (87.5 kg)   SpO2 91%   BMI 26.16 kg/m   HEMODYNAMICS:       INTAKE / OUTPUT: I/O last 3 completed shifts: In: 746.5 [I.V.:746.5] Out: 900 [Urine:900]  PHYSICAL EXAMINATION: General: Well-nourished well-developed 77 year old who is mentally alert and in no acute distress.  Comfortable  states abdominal pain is only 3 out of 10. Neuro: Intact pupils equal round reactive to light 5 out of 5 motor strength in all 4 extremities alert awake oriented x3 HEENT: No JVD or lymphadenopathy is appreciated Cardiovascular: Heart sounds are irregular with a controlled ventricular rate of 86 Lungs: Decreased breath sounds at the base Abdomen: Abdomen tender but with positive bowel sounds Musculoskeletal: Noted to be missing a finger on the right hand positive pulses in all 4 extremities strong femoral pulses Skin: Warm   LABS:  BMET Recent Labs  Lab 08/17/17 1349 08/18/17 0333  NA 140 137  K 4.1 4.2  CL 102 103  CO2 24 23  BUN 22* 25*  CREATININE 1.10 1.08  GLUCOSE 103* 109*    Electrolytes Recent Labs  Lab 08/17/17 1349 08/18/17 0333  CALCIUM 8.9 8.4*  MG 1.8 1.8  PHOS 3.6 4.4    CBC Recent Labs  Lab 08/17/17 1349 08/18/17 0333  WBC 24.2* 15.9*  HGB 17.4* 15.1  HCT 51.9 45.1  PLT 91* 65*    Coag's Recent Labs  Lab 08/17/17 1349  APTT 21*  INR 1.02    Sepsis Markers Recent Labs  Lab 08/17/17 1349 08/18/17 0335  LATICACIDVEN 4.5* 2.5*  PROCALCITON 0.24  --     ABG No results for input(s): PHART, PCO2ART, PO2ART in the last 168 hours.  Liver Enzymes Recent Labs  Lab 08/17/17 1349  AST 47*  ALT 27  ALKPHOS 27*  BILITOT 1.6*  ALBUMIN 3.3*    Cardiac Enzymes Recent Labs  Lab 08/17/17 1349 08/17/17 2128 08/18/17 0333  TROPONINI <0.03 <0.03 <0.03    Glucose No results for input(s): GLUCAP in the last 168 hours.  Imaging No results  found.   STUDIES:  08/17/2017 CT angiography chest abdomen and pelvis High density mural thrombus beginning at the level of the arch and continuing throughout the distal aorta.  No definite enhancement of the hematoma.  There is however stranding in the region of the isthmus/ligamentum arteriosum as with leakage.  CULTURES:  ANTIBIOTICS: None  SIGNIFICANT EVENTS: Transferred to [facility] on 08/17/1998  LINES/TUBES:   DISCUSSION: Mr. MORDCHE HEDGLIN is a 77 year old male, remote smoker quit in his 40s, known hypertension, known atrial fibrillation followed by Dr. Cleatrice Burke.  He has been on Xarelto in the past developed thrombocytopenia and was actually evaluated by the cancer clinic.  Clear to be any cancer or hematological reason for his thrombocytopenia.  He developed diarrhea abdominal pain and back pain approximately 2 weeks  ago.  Diarrhea cleared after 1 week of abdominal back pain continued.  He notes the pain was so bad that he sought medical treatment on 08/17/2017.  Reported to have aortic aneurysm with possible dissection.  This was at [facility] and aspirin [place].  He was transported to [facility] to the pulmonary critical care service with vascular surgery to evaluate for possible repair of aortic aneurysm.  Time of admission is awake alert in no acute distress.  Less than 140.  Reports that his pain is better. ASSESSMENT / PLAN:  PULMONARY A: Former smoker quit in his 40s P:   O2 as needed If undergoes aortic aneurysm repair most likely on mechanical ventilatory support  CARDIOVASCULAR A:  History of hypertension She of atrial fibrillation for which he was on Xarelto Recent diagnosis of aortic aneurysm with suspected dissection P:  patient was initially on esmolol drip which was then switched to labetalol drip secondary to bradycardia.  Patient will be started on oral labetalol and transitioned off the drip.  Patient blood  pressure stable with a goal of systolic 527P.   Hydralazine as needed. Follow-up echo. Patient being followed by cardiology and will need aggressive blood pressure management as an outpatient.  Patient be monitored in the ICU for another 24-48 hours. Discussed case with cardiology and vascular surgery.  RENAL Lab Results  Component Value Date   CREATININE 1.08 08/18/2017   CREATININE 1.10 08/17/2017   CREATININE 1.16 09/04/2016   CREATININE 1.10 08/31/2015   A:   No reported issues P:   Follow creatinine in the setting of aortic aneurysm dissection Monitor urine output.  GASTROINTESTINAL A:   Recent abdominal pain Recent diarrhea P:   Antiemetics as needed  HEMATOLOGIC A:   History of chronic anticoagulation Recent evaluation for thrombocytopenia while on Xarelto for atrial fibrillation Rheumatoid arthritis P:  Follow hemoglobin and platelets If platelets currently at 150.  No clinical indication of bleeding at this time.  Hemoglobin is stable.  INFECTIOUS A:   No overt source of infection  recent history of diarrhea No catheter leukocytosis secondary to reactive.   P:   Monitor white count and temperature curve No further infection seen at this time.  Patient will be monitored off of IV antibiotics.  ENDOCRINE CBG (last 3)  No results for input(s): GLUCAP in the last 72 hours.   A:   No reported issues P:   Monitor glucose lab data Follow-up TSH  NEUROLOGIC A:   Awake alert no acute distress Mild anxiety P:   RASS goal: 0 Anxiolytics as needed   FAMILY  - Updates: 08/18/2017 patient updated at bedside.  Patient is very well aware of his current condition and the severity.  All questions were answered.  Patient has a very large family who was at bedside.  They are also very well aware of patient current condition.  - Inter-disciplinary family meet or Palliative Care meeting due by:  day [place] [facility] [place] Pulmonary Critical  Care Pager: [phone]

## 2017-08-18 NOTE — Progress Notes (Signed)
Progress Note  Patient Name: Jerome Sullivan Date of Encounter: 08/18/2017  Primary Cardiologist: No primary care provider on file.   Subjective   Off Xarelto.  Feeling better.  No chest pain.  No shortness of breath. Minimal lower abdominal discomfort.   Inpatient Medications    Scheduled Meds: . pantoprazole (PROTONIX) IV  40 mg Intravenous QHS   Continuous Infusions: . sodium chloride 250 mL (08/18/17 0600)  . labetalol (NORMODYNE) infusion 0.5 mg/min (08/18/17 0600)   PRN Meds: sodium chloride, fentaNYL (SUBLIMAZE) injection, hydrALAZINE, ondansetron (ZOFRAN) IV   Vital Signs    Vitals:   08/18/17 0830 08/18/17 0900 08/18/17 0930 08/18/17 1000  BP: 120/78 (!) 122/92 131/81 (!) 122/93  Pulse: 88 86 78 82  Resp: 17 17 17 14   Temp:      TempSrc:      SpO2: 93% 94% 96% 97%  Weight:      Height:        Intake/Output Summary (Last 24 hours) at 08/18/2017 1029 Last data filed at 08/18/2017 1000 Gross per 24 hour  Intake 1056.51 ml  Output 1050 ml  Net 6.51 ml   Filed Weights   08/17/17 1100 08/18/17 0430  Weight: 195 lb 12.3 oz (88.8 kg) 192 lb 14.4 oz (87.5 kg)    Telemetry    Atrial fibrillation- Personally Reviewed  ECG    Atrial fibrillation Personally Reviewed  Physical Exam   GEN: No acute distress.   Neck: No JVD Cardiac:  Irregularly irregular, no murmurs, rubs, or gallops.  Respiratory: Clear to auscultation bilaterally. GI: Soft, nontender, non-distended  MS: No edema; No deformity. Neuro:  Nonfocal  Psych: Normal affect   Labs    Chemistry Recent Labs  Lab 08/17/17 1349 08/18/17 0333  NA 140 137  K 4.1 4.2  CL 102 103  CO2 24 23  GLUCOSE 103* 109*  BUN 22* 25*  CREATININE 1.10 1.08  CALCIUM 8.9 8.4*  PROT 6.3*  --   ALBUMIN 3.3*  --   AST 47*  --   ALT 27  --   ALKPHOS 27*  --   BILITOT 1.6*  --   GFRNONAA >60 >60  GFRAA >60 >60  ANIONGAP 14 11     Hematology Recent Labs  Lab 08/17/17 1349 08/18/17 0333  WBC  24.2* 15.9*  RBC 5.78 5.00  HGB 17.4* 15.1  HCT 51.9 45.1  MCV 89.8 90.2  MCH 30.1 30.2  MCHC 33.5 33.5  RDW 15.6* 16.1*  PLT 91* 65*    Cardiac Enzymes Recent Labs  Lab 08/17/17 1349 08/17/17 2128 08/18/17 0333  TROPONINI <0.03 <0.03 <0.03   No results for input(s): TROPIPOC in the last 168 hours.   BNPNo results for input(s): BNP, PROBNP in the last 168 hours.   DDimer No results for input(s): DDIMER in the last 168 hours.   Radiology    No results found.  Cardiac Studies   Echocardiogram 09/20/16: - Left ventricle: The cavity size was normal. Wall thickness was   increased in a pattern of moderate LVH. Systolic function was   normal. The estimated ejection fraction was in the range of 55%   to 60%. Wall motion was normal; there were no regional wall   motion abnormalities. The study is not technically sufficient to   allow evaluation of LV diastolic function. - Left atrium: The atrium was severely dilated. - Right atrium: The atrium was severely dilated. - Atrial septum: No defect or patent foramen ovale was  identified.   Patient Profile     77 y.o. male with type B aortic dissection, intramural hematoma with permanent atrial fibrillation now off of Xarelto, difficult to control hypertension, hyperlipidemia  Assessment & Plan    Type B aortic dissection with intramural hematoma -Obviously he is off of anticoagulation, Xarelto.    Permanent atrial fibrillation - Overall stable heart rate.  Off anticoagulation.  This does put him at increased risk for stroke.  Essential hypertension -Seems to be responding very well to IV labetalol.  We will transition him over to p.o. labetalol 200 mg p.o. twice daily.  Stop IV drip after administration of p.o. Discussed with nursing. Reducing shear forces.   For questions or updates, please contact [place] Please consult [email or web address] for contact info under Cardiology/STEMI.      Signed, Candee Furbish, MD    08/18/2017, 10:29 AM

## 2017-08-18 NOTE — Progress Notes (Signed)
      [address]       [place] [address]             [phone]        CARDIOTHORACIC SURGERY PROGRESS NOTE  Subjective: Feels much better.  Mild lower abdominal discomfort and minimal pain in lower back.  Objective: Vital signs: BP Readings from Last 1 Encounters:  08/18/17 120/78   Pulse Readings from Last 1 Encounters:  08/18/17 88   Resp Readings from Last 1 Encounters:  08/18/17 17   Temp Readings from Last 1 Encounters:  08/18/17 97.9 F (36.6 C) (Oral)    Hemodynamics:    Physical Exam:  Rhythm:   Afib  Breath sounds: clear  Heart sounds:  irregular  Incisions:  n/a  Abdomen:  soft  Extremities:  Warm, well-perfused, palpable pulses   Intake/Output from previous day: 02/22 0701 - 02/23 0700 In: 746.5 [I.V.:746.5] Out: 900 [Urine:900] Intake/Output this shift: Total I/O In: 35 [I.V.:35] Out: 150 [Urine:150]  Lab Results:  CBC: Recent Labs    08/17/17 1349 08/18/17 0333  WBC 24.2* 15.9*  HGB 17.4* 15.1  HCT 51.9 45.1  PLT 91* 65*    BMET:  Recent Labs    08/17/17 1349 08/18/17 0333  NA 140 137  K 4.1 4.2  CL 102 103  CO2 24 23  GLUCOSE 103* 109*  BUN 22* 25*  CREATININE 1.10 1.08  CALCIUM 8.9 8.4*     PT/INR:   Recent Labs    08/17/17 1349  LABPROT 13.3  INR 1.02    CBG (last 3)  No results for input(s): GLUCAP in the last 72 hours.  ABG No results found for: PHART, PCO2ART, PO2ART, HCO3, TCO2, ACIDBASEDEF, O2SAT  CXR: n/a  Assessment/Plan:  Stable BP well controlled on IV labetalol Will defer management of hypertension to Cardiology team, but I would favor transitioning to oral labetalol Not a candidate for systemic anticoagulation at this time Okay to mobilize out of bed I have encouraged the patient to purchase a blood pressure machine for home use All questions answered  I spent in excess of 15 minutes during the conduct of this hospital encounter and >50% of this time involved direct  face-to-face encounter with the patient for counseling and/or coordination of their care.   Rexene Alberts, MD 08/18/2017 9:15 AM

## 2017-08-18 NOTE — Progress Notes (Signed)
  Progress Note    08/18/2017 10:22 AM * No surgery found *  Subjective:  Feeling better  Vitals:   08/18/17 0930 08/18/17 1000  BP: 131/81 (!) 122/93  Pulse: 78 82  Resp: 17 14  Temp:    SpO2: 96% 97%    Physical Exam: aaox3 Non labored respirations Abdomen is soft Palpable pedal pulses  CBC    Component Value Date/Time   WBC 15.9 (H) 08/18/2017 0333   RBC 5.00 08/18/2017 0333   HGB 15.1 08/18/2017 0333   HGB 17.1 09/04/2016 0954   HCT 45.1 08/18/2017 0333   HCT 47.9 09/04/2016 0954   PLT 65 (L) 08/18/2017 0333   PLT 155 09/04/2016 0954   MCV 90.2 08/18/2017 0333   MCV 85 09/04/2016 0954   MCH 30.2 08/18/2017 0333   MCHC 33.5 08/18/2017 0333   RDW 16.1 (H) 08/18/2017 0333   RDW 13.8 09/04/2016 0954   LYMPHSABS 1.5 09/04/2016 0954   MONOABS 0.5 08/31/2015 1007   EOSABS 0.1 09/04/2016 0954   BASOSABS 0.0 09/04/2016 0954    BMET    Component Value Date/Time   NA 137 08/18/2017 0333   NA 141 09/04/2016 0954   K 4.2 08/18/2017 0333   CL 103 08/18/2017 0333   CO2 23 08/18/2017 0333   GLUCOSE 109 (H) 08/18/2017 0333   BUN 25 (H) 08/18/2017 0333   BUN 15 09/04/2016 0954   CREATININE 1.08 08/18/2017 0333   CREATININE 1.10 08/31/2015 1007   CALCIUM 8.4 (L) 08/18/2017 0333   GFRNONAA >60 08/18/2017 0333   GFRAA >60 08/18/2017 0333    INR    Component Value Date/Time   INR 1.02 08/17/2017 1349     Intake/Output Summary (Last 24 hours) at 08/18/2017 1022 Last data filed at 08/18/2017 1000 Gross per 24 hour  Intake 1056.51 ml  Output 1050 ml  Net 6.51 ml     Assessment:  76 y.o. male is here with descending thoracic imh, bp better controlled, afib with thrombocytopenia  Plan: Activity as tolerates afib/htn control per cardiology Will need repeat ct in future  Rosser Collington C. Donzetta Matters, MD [facility] Office: [phone] Pager: [phone]  08/18/2017 10:22 AM

## 2017-08-18 NOTE — Plan of Care (Signed)
  Progressing Clinical Measurements: Cardiovascular complication will be avoided 08/18/2017 0854 - Progressing by Boris Lown, RN Activity: Risk for activity intolerance will decrease 08/18/2017 0854 - Progressing by Boris Lown, RN Note Remains on strict bedrest, but ok to get edge of bed or chair for meals.  Nutrition: Adequate nutrition will be maintained 08/18/2017 0854 - Progressing by Boris Lown, RN Pain Managment: General experience of comfort will improve 08/18/2017 0854 - Progressing by Boris Lown, RN   Completed/Met Education: Knowledge of General Education information will improve 08/18/2017 0854 - Completed/Met by Boris Lown, RN Clinical Measurements: Respiratory complications will improve 08/18/2017 0854 - Completed/Met by Boris Lown, RN

## 2017-08-19 ENCOUNTER — Other Ambulatory Visit (HOSPITAL_COMMUNITY): Payer: Medicare PPO

## 2017-08-19 ENCOUNTER — Inpatient Hospital Stay (HOSPITAL_COMMUNITY): Payer: Medicare PPO

## 2017-08-19 DIAGNOSIS — I7102 Dissection of abdominal aorta: Secondary | ICD-10-CM

## 2017-08-19 DIAGNOSIS — T148XXA Other injury of unspecified body region, initial encounter: Secondary | ICD-10-CM

## 2017-08-19 DIAGNOSIS — I361 Nonrheumatic tricuspid (valve) insufficiency: Secondary | ICD-10-CM

## 2017-08-19 LAB — BASIC METABOLIC PANEL
ANION GAP: 11 (ref 5–15)
BUN: 25 mg/dL — AB (ref 6–20)
CALCIUM: 8.6 mg/dL — AB (ref 8.9–10.3)
CO2: 21 mmol/L — AB (ref 22–32)
Chloride: 103 mmol/L (ref 101–111)
Creatinine, Ser: 1.15 mg/dL (ref 0.61–1.24)
GFR calc Af Amer: >60 mL/min (ref >60)
GFR calc non Af Amer: >60 mL/min (ref >60)
GLUCOSE: 118 mg/dL — AB (ref 65–99)
Potassium: 4 mmol/L (ref 3.5–5.1)
Sodium: 135 mmol/L (ref 135–145)

## 2017-08-19 LAB — CBC WITH DIFFERENTIAL/PLATELET
BASOS PCT: 0 %
Basophils Absolute: 0 10*3/uL (ref 0.0–0.1)
EOS PCT: 0 %
Eosinophils Absolute: 0 10*3/uL (ref 0.0–0.7)
HEMATOCRIT: 45.4 % (ref 39.0–52.0)
Hemoglobin: 14.9 g/dL (ref 13.0–17.0)
LYMPHS ABS: 0.9 10*3/uL (ref 0.7–4.0)
Lymphocytes Relative: 5 %
MCH: 29.8 pg (ref 26.0–34.0)
MCHC: 32.8 g/dL (ref 30.0–36.0)
MCV: 90.8 fL (ref 78.0–100.0)
MONOS PCT: 8 %
Monocytes Absolute: 1.4 10*3/uL — ABNORMAL HIGH (ref 0.1–1.0)
NEUTROS ABS: 15.8 10*3/uL — AB (ref 1.7–7.7)
Neutrophils Relative %: 87 %
Platelets: 48 10*3/uL — ABNORMAL LOW (ref 150–400)
RBC: 5 MIL/uL (ref 4.22–5.81)
RDW: 15.7 % — AB (ref 11.5–15.5)
WBC: 18.1 10*3/uL — ABNORMAL HIGH (ref 4.0–10.5)

## 2017-08-19 LAB — ECHOCARDIOGRAM COMPLETE
FS: 25 % — AB (ref 28–44)
Height: 72 in
IVS/LV PW RATIO, ED: 0.76
LA ID, A-P, ES: 46 mm
LA diam end sys: 46 mm
LA diam index: 2.2 cm/m2
LA vol A4C: 107 ml
LA vol index: 48.8 mL/m2
LA vol: 102 mL
LV PW d: 12.7 mm — AB (ref 0.6–1.1)
LVOT area: 3.46 cm2
LVOT diameter: 21 mm
Weight: 3059.2 oz

## 2017-08-19 LAB — HEMOGLOBIN A1C
Hgb A1c MFr Bld: 5.5 % (ref 4.8–5.6)
Mean Plasma Glucose: 111 mg/dL

## 2017-08-19 MED ORDER — ACETAMINOPHEN 325 MG PO TABS
650.0000 mg | ORAL_TABLET | Freq: Four times a day (QID) | ORAL | Status: DC | PRN
  Administered 2017-08-19 – 2017-08-20 (×2): 650 mg via ORAL
  Filled 2017-08-19 (×2): qty 2

## 2017-08-19 MED ORDER — DILTIAZEM HCL 30 MG PO TABS
30.0000 mg | ORAL_TABLET | Freq: Four times a day (QID) | ORAL | Status: AC
  Administered 2017-08-19 – 2017-08-20 (×8): 30 mg via ORAL
  Filled 2017-08-19 (×8): qty 1

## 2017-08-19 NOTE — Progress Notes (Signed)
  Echocardiogram 2D Echocardiogram has been performed.  Johny Chess 08/19/2017, 10:57 AM

## 2017-08-19 NOTE — Progress Notes (Signed)
PULMONARY / CRITICAL CARE MEDICINE   Name: Jerome Sullivan MRN: [id number] DOB: 1941-04-13    ADMISSION DATE:  08/17/2017   REFERRING MD: Vascular surgery  CHIEF COMPLAINT: Abdominal pain  HISTORY OF PRESENT ILLNESS:    Jerome Sullivan is a 77 year old male, remote smoker quit in his 91s, known hypertension, known atrial fibrillation followed by Dr. Cleatrice Burke.  He has been on Xarelto in the past developed thrombocytopenia and was actually evaluated by the cancer clinic idiopathic thrombocytopenia was treated with steroids, platelets are now 129.  Clear to be any cancer or hematological reason for his thrombocytopenia.  He developed diarrhea abdominal pain and back pain approximately 2 weeks ago.  Diarrhea cleared after 1 week of abdominal back pain continued.  He notes the pain was so bad that he sought medical treatment on 08/17/2017.  Reported to have aortic aneurysm with possible dissection.  This was at [facility] and [facility] [place].  He was transported to [facility] to the pulmonary critical care service with CVT S surgery to evaluate for possible repair of aortic aneurysm.  Time of admission is awake alert in no acute distress.  Less than 140.  Reports that his pain is better.  SUBJECTIVE:  Overnight no acute events. BP now well controlled.  States his abdominal pain is significantly imporved now down to 2/10.     PAST MEDICAL HISTORY :  He  has a past medical history of A-fib [facility]), Aortic dissection distal to left subclavian ([place]) (08/17/2017), HTN (hypertension), Hypercholesteremia, and Intramural hematoma of descending thoracic and abdominal aorta (08/17/2017). Rheumatoid arthritis ITP Aortic aneurysm PAST SURGICAL HISTORY: He  has no past surgical history on file.  No Known Allergies  No current facility-administered medications on file prior to encounter.    Current Outpatient Medications on File Prior to Encounter  Medication Sig  .  acetaminophen (TYLENOL) 325 MG tablet Take 325-650 mg by mouth every 6 (six) hours as needed for mild pain.  Marland Kitchen CALCIUM PO Take 1,200 mg by mouth 2 (two) times daily.    . Cholecalciferol (VITAMIN D3) 2000 UNITS TABS Take 1,000 mg by mouth daily.   . Coenzyme Q10 (CO Q 10 PO) Take 1 tablet by mouth 2 (two) times daily.   . metoprolol (LOPRESSOR) 50 MG tablet TAKE 1/2 TABLET BY MOUTH TWICE DAILY  . omeprazole (PRILOSEC) 40 MG capsule Take 40 mg by mouth daily.  . polyvinyl alcohol (LIQUIFILM TEARS) 1.4 % ophthalmic solution Place 1 drop into both eyes as needed for dry eyes.  . potassium chloride SA (K-DUR,KLOR-CON) 20 MEQ tablet TAKE 1 TABLET BY MOUTH ONCE DAILY (Patient taking differently: take 1 tablet twice a day)  . predniSONE (DELTASONE) 20 MG tablet Take 20 mg by mouth 2 (two) times daily with a meal.  . simvastatin (ZOCOR) 40 MG tablet Take 40 mg by mouth daily.  Alveda Reasons 20 MG TABS tablet TAKE 1 TABLET BY MOUTH DAILY  . cetirizine (ZYRTEC) 10 MG tablet Take 10 mg by mouth daily.  . hydrochlorothiazide (HYDRODIURIL) 25 MG tablet TAKE 1 TABLET BY MOUTH ONCE DAILY  . magnesium gluconate (MAGONATE) 500 MG tablet Take 500 mg by mouth daily.  . rosuvastatin (CRESTOR) 20 MG tablet Take 20 mg by mouth daily.    FAMILY HISTORY:  His indicated that his mother is deceased. He indicated that his father is deceased. He indicated that his sister is alive. He indicated that his maternal grandmother is deceased. He indicated  that his maternal grandfather is deceased. He indicated that his paternal grandmother is deceased. He indicated that his paternal grandfather is deceased.   SOCIAL HISTORY: He  reports that he quit smoking about 39 years ago. His smoking use included cigarettes. he has never used smokeless tobacco. He reports that he does not drink alcohol or use drugs.  REVIEW OF SYSTEMS:   10 point review of system taken, please see HPI for positives and negatives.   SUBJECTIVE:   77 year old male who arrives via ambulance from [facility] who is awake alert in no acute distress  VITAL SIGNS: BP 121/73   Pulse (!) 101   Temp 98.5 F (36.9 C) (Oral)   Resp 16   Ht 6' (1.829 m)   Wt 191 lb 3.2 oz (86.7 kg)   SpO2 94%   BMI 25.93 kg/m   HEMODYNAMICS:       INTAKE / OUTPUT: I/O last 3 completed shifts: In: 7564 [P.O.:720; I.V.:320] Out: 1525 [Urine:1525]  PHYSICAL EXAMINATION: General: Well-nourished well-developed 77 year old who is mentally alert and in no acute distress.  Comfortable Neuro: awake, alert, oriented, moving all extremities HEENT: No JVD or lymphadenopathy is appreciated Cardiovascular: Heart sounds are irregular with a controlled ventricular rate of 86 Lungs: Decreased breath sounds at the base Abdomen: Abdomen tender Musculoskeletal: Noted to be missing a finger on the right hand positive pulses in all 4 extremities strong femoral pulses. Left radial pulse decreased in comparison to right Skin: Warm   LABS:  BMET Recent Labs  Lab 08/17/17 1349 08/18/17 0333  NA 140 137  K 4.1 4.2  CL 102 103  CO2 24 23  BUN 22* 25*  CREATININE 1.10 1.08  GLUCOSE 103* 109*    Electrolytes Recent Labs  Lab 08/17/17 1349 08/18/17 0333  CALCIUM 8.9 8.4*  MG 1.8 1.8  PHOS 3.6 4.4    CBC Recent Labs  Lab 08/17/17 1349 08/18/17 0333  WBC 24.2* 15.9*  HGB 17.4* 15.1  HCT 51.9 45.1  PLT 91* 65*    Coag's Recent Labs  Lab 08/17/17 1349  APTT 21*  INR 1.02    Sepsis Markers Recent Labs  Lab 08/17/17 1349 08/18/17 0335  LATICACIDVEN 4.5* 2.5*  PROCALCITON 0.24  --     ABG No results for input(s): PHART, PCO2ART, PO2ART in the last 168 hours.  Liver Enzymes Recent Labs  Lab 08/17/17 1349  AST 47*  ALT 27  ALKPHOS 27*  BILITOT 1.6*  ALBUMIN 3.3*    Cardiac Enzymes Recent Labs  Lab 08/17/17 1349 08/17/17 2128 08/18/17 0333  TROPONINI <0.03 <0.03 <0.03    Glucose No results for  input(s): GLUCAP in the last 168 hours.  Imaging No results found.   STUDIES:  08/17/2017 CT angiography chest abdomen and pelvis High density mural thrombus beginning at the level of the arch and continuing throughout the distal aorta.  No definite enhancement of the hematoma.  There is however stranding in the region of the isthmus/ligamentum arteriosum as with leakage.  CULTURES:  ANTIBIOTICS: None  SIGNIFICANT EVENTS: Transferred to [facility] on 08/17/1998  LINES/TUBES:   DISCUSSION: Jerome Sullivan is a 77 year old male, remote smoker quit in his 68s, known hypertension, known atrial fibrillation followed by Dr. Cleatrice Burke.  He has been on Xarelto in the past developed thrombocytopenia and was actually evaluated by the cancer clinic.  No cancer or hematological reason for his thrombocytopenia.  He developed diarrhea abdominal pain and back pain approximately 2  weeks ago.  Diarrhea cleared after 1 week of abdominal back pain continued.  He notes the pain was so bad that he sought medical treatment on 08/17/2017.  Reported to have aortic aneurysm with possible dissection.  This was at [facility].  He was transported to [facility] to the pulmonary critical care service with vascular surgery to evaluate for possible repair of aortic aneurysm.   ASSESSMENT / PLAN:  PULMONARY A: Former smoker quit in his 60s P:   Breathing comfortably on room air   CARDIOVASCULAR A:  History of hypertension She of atrial fibrillation for which he was on Xarelto Recent diagnosis of aortic aneurysm with suspected dissection P:  patient was initially on esmolol drip which was then switched to labetalol drip secondary to bradycardia, now on oral labetalol and transitioned off the drip. Patient blood pressure stable with a goal of systolic 010X.   Diltiazem started 2/24 by Cardiology for rate control Hydralazine as needed. Follow-up echo Patient being followed by  cardiology and will need aggressive blood pressure management as an outpatient.   Bilateral duplex ordered by Vascular to assess for popliteal aneurysms  RENAL Lab Results  Component Value Date   CREATININE 1.08 08/18/2017   CREATININE 1.10 08/17/2017   CREATININE 1.16 09/04/2016   CREATININE 1.10 08/31/2015   A:   No reported issues P:   Follow creatinine in the setting of aortic aneurysm dissection - stable Monitor urine output.  GASTROINTESTINAL A:   Recent abdominal pain Recent diarrhea P:   Antiemetics as needed  HEMATOLOGIC A:   History of chronic anticoagulation Recent evaluation for thrombocytopenia while on Xarelto for atrial fibrillation Rheumatoid arthritis P:  Follow hemoglobin and platelets No clinical indication of bleeding at this time.  Hemoglobin is stable. Transfuse for <10 unless bleeding or periprocedurally Holding anticoagulation as risks outweigh benefits with recent dissection, will need to reassess risk/benefit in the future once stabilized  INFECTIOUS A:   No overt source of infection  recent history of diarrhea No catheter leukocytosis secondary to reactive.   P:   Monitor white count and temperature curve No further infection seen at this time.  Patient will be monitored off of IV antibiotics.  ENDOCRINE CBG (last 3)  No results for input(s): GLUCAP in the last 72 hours.   A:   No reported issues P:   Monitor glucose lab data TSH WNL  NEUROLOGIC A:   Awake alert no acute distress Mild anxiety P:   RASS goal: 0 Anxiolytics as needed   FAMILY  - Updates: 08/18/2017 patient updated at bedside.  Patient is very well aware of his current condition and the severity.  All questions were answered.  Patient has a very large family who was at bedside.  They are also very well aware of patient current condition. Wife updated at bedside 2/24  - Inter-disciplinary family meet or Palliative Care meeting due by:  day 7  dispo - stable  for transfer to floor 2/24   Mali Jermisha Hoffart, MD [facility] Pulmonology/Critical Care Pager [phone] After hours pager: [phone]

## 2017-08-19 NOTE — Progress Notes (Signed)
Progress Note  Patient Name: Jerome Sullivan Date of Encounter: 08/19/2017  Primary Cardiologist:  Cathie Olden  Subjective   Off Xarelto because of intramural hematoma type B aortic dissection.  Previously had severe  pain now this has subsided.  Atrial fibrillation heart rate has increased on labetalol 200 mg p.o. twice daily.  No chest pain.  No shortness of breath.    Inpatient Medications    Scheduled Meds: . labetalol  200 mg Oral BID  . pantoprazole (PROTONIX) IV  40 mg Intravenous QHS   Continuous Infusions: . sodium chloride Stopped (08/18/17 1145)  . labetalol (NORMODYNE) infusion Stopped (08/18/17 1145)   PRN Meds: sodium chloride, fentaNYL (SUBLIMAZE) injection, hydrALAZINE, ondansetron (ZOFRAN) IV   Vital Signs    Vitals:   08/19/17 0400 08/19/17 0500 08/19/17 0600 08/19/17 0700  BP: 120/62 123/77 133/68 127/78  Pulse: (!) 106 100 93 (!) 41  Resp: (!) 25 (!) 26 20 19   Temp:      TempSrc:      SpO2: 96% 93% 98% 94%  Weight:  191 lb 3.2 oz (86.7 kg)    Height:        Intake/Output Summary (Last 24 hours) at 08/19/2017 6578 Last data filed at 08/19/2017 0300 Gross per 24 hour  Intake 772.5 ml  Output 1025 ml  Net -252.5 ml   Filed Weights   08/17/17 1100 08/18/17 0430 08/19/17 0500  Weight: 195 lb 12.3 oz (88.8 kg) 192 lb 14.4 oz (87.5 kg) 191 lb 3.2 oz (86.7 kg)    Telemetry    Atrial fibrillation periods of rapid ventricular response- Personally Reviewed  ECG    Atrial fibrillation previously reviewed, no new EKG personally Reviewed  Physical Exam   GEN: Well nourished, well developed, in no acute distress  HEENT: normal  Neck: no JVD, carotid bruits, or masses Cardiac: Irregularly irregular, at times tachycardic; no murmurs, rubs, or gallops,no edema  Respiratory: Normal work of breathing however he does have bilateral basal wheezes heard GI: soft, nontender, nondistended, + BS MS: no deformity or atrophy  Skin: warm and dry, no rash Neuro:   Alert and Oriented x 3, Strength and sensation are intact Psych: euthymic mood, full affect   Labs    Chemistry Recent Labs  Lab 08/17/17 1349 08/18/17 0333  NA 140 137  K 4.1 4.2  CL 102 103  CO2 24 23  GLUCOSE 103* 109*  BUN 22* 25*  CREATININE 1.10 1.08  CALCIUM 8.9 8.4*  PROT 6.3*  --   ALBUMIN 3.3*  --   AST 47*  --   ALT 27  --   ALKPHOS 27*  --   BILITOT 1.6*  --   GFRNONAA >60 >60  GFRAA >60 >60  ANIONGAP 14 11     Hematology Recent Labs  Lab 08/17/17 1349 08/18/17 0333  WBC 24.2* 15.9*  RBC 5.78 5.00  HGB 17.4* 15.1  HCT 51.9 45.1  MCV 89.8 90.2  MCH 30.1 30.2  MCHC 33.5 33.5  RDW 15.6* 16.1*  PLT 91* 65*    Cardiac Enzymes Recent Labs  Lab 08/17/17 1349 08/17/17 2128 08/18/17 0333  TROPONINI <0.03 <0.03 <0.03   No results for input(s): TROPIPOC in the last 168 hours.   BNPNo results for input(s): BNP, PROBNP in the last 168 hours.   DDimer No results for input(s): DDIMER in the last 168 hours.   Radiology    No results found.  Cardiac Studies   Echocardiogram 09/20/16: - Left  ventricle: The cavity size was normal. Wall thickness was   increased in a pattern of moderate LVH. Systolic function was   normal. The estimated ejection fraction was in the range of 55%   to 60%. Wall motion was normal; there were no regional wall   motion abnormalities. The study is not technically sufficient to   allow evaluation of LV diastolic function. - Left atrium: The atrium was severely dilated. - Right atrium: The atrium was severely dilated. - Atrial septum: No defect or patent foramen ovale was identified.   Patient Profile     77 y.o. male with type B aortic dissection, intramural hematoma with permanent atrial fibrillation now off of Xarelto, with occasional rapid ventricular response, difficult to control hypertension, hyperlipidemia  Assessment & Plan    Type B aortic dissection with intramural hematoma -Obviously he is off of  anticoagulation, Xarelto.    Permanent atrial fibrillation -Rate control currently an issue.  Previously at home he was taking metoprolol 25 mg twice daily.  Now he is taking labetalol 200 mg twice a day to help with effective  blood pressure control in the setting of type B aortic dissection.  His blood pressure seems to be under good control but his heart rate at times is fairly elevated in the 140 range on telemetry.  I will add diltiazem 30 mg p.o. every 6 hours to see if a combination of beta-blocker and calcium channel blocker will help.  Off anticoagulation.  This does put him at increased risk for stroke.  However risks of aortic dissection outweigh benefits at this point.  In the future, perhaps several weeks down the road if felt comfortable from thoracic surgeries perspective, anticoagulation may be considered to be restarted.  Essential hypertension -Doing very well on labetalol 200 mg twice a day.  Adding diltiazem 30 mg every 6 hours for improved rate control of atrial fibrillation.  Watch for any signs of hypotension.  Consolidate diltiazem tomorrow.  Thrombocytopenia -Continue to monitor.  Question etiology.  Platelet count 193 in 2011, 193 in 2015, 194 in 2017, 155 on 09/04/16, during this admission 08/17/17-white blood cell count 24.2 and platelet count 91,000, 08/18/17 white count reducing at 15.9 and platelet count also reduced at 65,000.  He is not receiving any heparin products.  Could this be potential sequestration in the setting of his acute illness?  Wheeze -Heard bilaterally.  He has been battling this for quite some time.  Per pulmonary team.  Pulmonary toilet, mobilization.   For questions or updates, please contact [facility] Please consult [email or web address] for contact info under Cardiology/STEMI.      Signed, Candee Furbish, MD  08/19/2017, 8:29 AM

## 2017-08-19 NOTE — Progress Notes (Addendum)
      [address]       [place] [address]             [phone]        CARDIOTHORACIC SURGERY PROGRESS NOTE  Subjective: No complaints other than minimal discomfort lower abdomen.  No back pain.  No SOB  Objective: Vital signs: BP Readings from Last 1 Encounters:  08/19/17 121/73   Pulse Readings from Last 1 Encounters:  08/19/17 (!) 101   Resp Readings from Last 1 Encounters:  08/19/17 16   Temp Readings from Last 1 Encounters:  08/19/17 98.5 F (36.9 C) (Oral)    Hemodynamics:    Physical Exam:  Rhythm:   Afib  Breath sounds: clear  Heart sounds:  irregular  Incisions:  n/a  Abdomen:  soft  Extremities:  Warm, well-perfused, palpable pulses   Intake/Output from previous day: 02/23 0701 - 02/24 0700 In: 807.5 [P.O.:720; I.V.:87.5] Out: 1175 [Urine:1175] Intake/Output this shift: Total I/O In: 240 [P.O.:240] Out: -   Lab Results:  CBC: Recent Labs    08/17/17 1349 08/18/17 0333  WBC 24.2* 15.9*  HGB 17.4* 15.1  HCT 51.9 45.1  PLT 91* 65*    BMET:  Recent Labs    08/17/17 1349 08/18/17 0333  NA 140 137  K 4.1 4.2  CL 102 103  CO2 24 23  GLUCOSE 103* 109*  BUN 22* 25*  CREATININE 1.10 1.08  CALCIUM 8.9 8.4*     PT/INR:   Recent Labs    08/17/17 1349  LABPROT 13.3  INR 1.02    CBG (last 3)  No results for input(s): GLUCAP in the last 72 hours.  ABG No results found for: PHART, PCO2ART, PO2ART, HCO3, TCO2, ACIDBASEDEF, O2SAT  CXR: n/a  Assessment/Plan:   Clinically stable and BP well controlled Thrombocytopenia worse, etiology unclear Plan per Cardiology, CCM teams We will continue to follow along intermittently, but please call if specific problems or questions arise  I spent in excess of 15 minutes during the conduct of this hospital encounter and >50% of this time involved direct face-to-face encounter with the patient for counseling and/or coordination of their care.   Rexene Alberts,  MD 08/19/2017 10:10 AM

## 2017-08-19 NOTE — Progress Notes (Signed)
  Progress Note    08/19/2017 10:06 AM * No surgery found *  Subjective:  No acute issues, was out of bed yesterday and tolerated food well  Vitals:   08/19/17 0900 08/19/17 1000  BP: 111/71 121/73  Pulse: 89 (!) 101  Resp: (!) 22 16  Temp:    SpO2: 100% 94%    Physical Exam: [id number] Abdomen is soft 3+ bilateral popliteal pulses Palpable pt on left, cannot palpate right pedal pulses but feet are warm  CBC    Component Value Date/Time   WBC 15.9 (H) 08/18/2017 0333   RBC 5.00 08/18/2017 0333   HGB 15.1 08/18/2017 0333   HGB 17.1 09/04/2016 0954   HCT 45.1 08/18/2017 0333   HCT 47.9 09/04/2016 0954   PLT 65 (L) 08/18/2017 0333   PLT 155 09/04/2016 0954   MCV 90.2 08/18/2017 0333   MCV 85 09/04/2016 0954   MCH 30.2 08/18/2017 0333   MCHC 33.5 08/18/2017 0333   RDW 16.1 (H) 08/18/2017 0333   RDW 13.8 09/04/2016 0954   LYMPHSABS 1.5 09/04/2016 0954   MONOABS 0.5 08/31/2015 1007   EOSABS 0.1 09/04/2016 0954   BASOSABS 0.0 09/04/2016 0954    BMET    Component Value Date/Time   NA 137 08/18/2017 0333   NA 141 09/04/2016 0954   K 4.2 08/18/2017 0333   CL 103 08/18/2017 0333   CO2 23 08/18/2017 0333   GLUCOSE 109 (H) 08/18/2017 0333   BUN 25 (H) 08/18/2017 0333   BUN 15 09/04/2016 0954   CREATININE 1.08 08/18/2017 0333   CREATININE 1.10 08/31/2015 1007   CALCIUM 8.4 (L) 08/18/2017 0333   GFRNONAA >60 08/18/2017 0333   GFRAA >60 08/18/2017 0333    INR    Component Value Date/Time   INR 1.02 08/17/2017 1349     Intake/Output Summary (Last 24 hours) at 08/19/2017 1006 Last data filed at 08/19/2017 0300 Gross per 24 hour  Intake 497.5 ml  Output 1025 ml  Net -527.5 ml     Assessment:  77 y.o. male is here with aortic IMH, bp is well controlled but HR is irregular and rapid  Plan: Check ble duplexes for popliteal aneurysms bp control per cardiology Check bmp and cbc with differential  If he remains stable from pain standpoint will plan CT as  outpatient   Jazzalyn Loewenstein C. Donzetta Matters, MD [facility] [place] Office: [phone] Pager: [phone]  08/19/2017 10:06 AM

## 2017-08-20 DIAGNOSIS — I10 Essential (primary) hypertension: Secondary | ICD-10-CM

## 2017-08-20 MED ORDER — PANTOPRAZOLE SODIUM 40 MG PO TBEC
40.0000 mg | DELAYED_RELEASE_TABLET | Freq: Every day | ORAL | Status: DC
  Administered 2017-08-20 – 2017-08-22 (×3): 40 mg via ORAL
  Filled 2017-08-20 (×3): qty 1

## 2017-08-20 MED ORDER — DILTIAZEM HCL ER COATED BEADS 120 MG PO CP24
120.0000 mg | ORAL_CAPSULE | Freq: Every day | ORAL | Status: DC
  Administered 2017-08-21 – 2017-08-22 (×2): 120 mg via ORAL
  Filled 2017-08-20 (×2): qty 1

## 2017-08-20 MED ORDER — ACETAMINOPHEN 325 MG PO TABS
650.0000 mg | ORAL_TABLET | Freq: Four times a day (QID) | ORAL | Status: DC | PRN
  Administered 2017-08-21: 650 mg via ORAL
  Filled 2017-08-20: qty 2

## 2017-08-20 NOTE — Progress Notes (Addendum)
  Progress Note    08/20/2017 9:36 AM Hospital Day 3  Subjective:  Pain improved from admission  Afebrile HR 70's-90's Afib 41'Y-606'T systolic 01% RA  Vitals:   08/20/17 0808 08/20/17 0928  BP: 114/69 114/69  Pulse: 84 (!) 104  Resp: 18   Temp:    SpO2: 94%     Physical Exam: Cardiac:  irregular Lungs:  Non labored Abdomen:  Soft, non tender to palpation Extremities:  Bilateral feet are warm; +palpable left PT pulse and bilateral radial pulses palpable  CBC    Component Value Date/Time   WBC 18.1 (H) 08/19/2017 1014   RBC 5.00 08/19/2017 1014   HGB 14.9 08/19/2017 1014   HGB 17.1 09/04/2016 0954   HCT 45.4 08/19/2017 1014   HCT 47.9 09/04/2016 0954   PLT 48 (L) 08/19/2017 1014   PLT 155 09/04/2016 0954   MCV 90.8 08/19/2017 1014   MCV 85 09/04/2016 0954   MCH 29.8 08/19/2017 1014   MCHC 32.8 08/19/2017 1014   RDW 15.7 (H) 08/19/2017 1014   RDW 13.8 09/04/2016 0954   LYMPHSABS 0.9 08/19/2017 1014   LYMPHSABS 1.5 09/04/2016 0954   MONOABS 1.4 (H) 08/19/2017 1014   EOSABS 0.0 08/19/2017 1014   EOSABS 0.1 09/04/2016 0954   BASOSABS 0.0 08/19/2017 1014   BASOSABS 0.0 09/04/2016 0954    BMET    Component Value Date/Time   NA 135 08/19/2017 1014   NA 141 09/04/2016 0954   K 4.0 08/19/2017 1014   CL 103 08/19/2017 1014   CO2 21 (L) 08/19/2017 1014   GLUCOSE 118 (H) 08/19/2017 1014   BUN 25 (H) 08/19/2017 1014   BUN 15 09/04/2016 0954   CREATININE 1.15 08/19/2017 1014   CREATININE 1.10 08/31/2015 1007   CALCIUM 8.6 (L) 08/19/2017 1014   GFRNONAA >60 08/19/2017 1014   GFRAA >60 08/19/2017 1014    INR    Component Value Date/Time   INR 1.02 08/17/2017 1349     Intake/Output Summary (Last 24 hours) at 08/20/2017 0936 Last data filed at 08/20/2017 0600 Gross per 24 hour  Intake 540 ml  Output 500 ml  Net 40 ml   Popliteal artery has normal triphasic waveforms with no evidence of aneurysmal dilatation.  Evidence of mild to moderate, irregular  heterogenous plaque, noted.  Popliteal veins exhibit significant sluggish flow.    Assessment/Plan:  77 y.o. male with aortic intramural hematoma Hospital Day 3  -pain improved -continue with BP control -there is no evidence of popliteal aneurysms bilaterally   Leontine Locket, PA-C [facility] [phone] 08/20/2017 9:36 AM  I have independently interviewed and examined patient and agree with PA assessment and plan above. Popliteal duplexes wnl. Hr and bp improved and should be ok for floor transfer. Plan repeat imaging as outpatient if he remains stable.   Gustavus Haskin C. Donzetta Matters, MD [facility] [place] Office: [phone] Pager: [phone]

## 2017-08-20 NOTE — Plan of Care (Signed)
  Progressing Health Behavior/Discharge Planning: Ability to manage health-related needs will improve 08/20/2017 1003 - Progressing by Vickey Sages, RN Clinical Measurements: Ability to maintain clinical measurements within normal limits will improve 08/20/2017 1003 - Progressing by Vickey Sages, RN Will remain free from infection 08/20/2017 1003 - Progressing by Vickey Sages, RN Diagnostic test results will improve 08/20/2017 1003 - Progressing by Vickey Sages, RN Cardiovascular complication will be avoided 08/20/2017 1003 - Progressing by Vickey Sages, RN Activity: Risk for activity intolerance will decrease 08/20/2017 1003 - Progressing by Vickey Sages, RN Nutrition: Adequate nutrition will be maintained 08/20/2017 1003 - Progressing by Vickey Sages, RN Coping: Level of anxiety will decrease 08/20/2017 1003 - Progressing by Vickey Sages, RN Elimination: Will not experience complications related to bowel motility 08/20/2017 1003 - Progressing by Vickey Sages, RN Will not experience complications related to urinary retention 08/20/2017 1003 - Progressing by Vickey Sages, RN Pain Managment: General experience of comfort will improve 08/20/2017 1003 - Progressing by Vickey Sages, RN Safety: Ability to remain free from injury will improve 08/20/2017 1003 - Progressing by Vickey Sages, RN Skin Integrity: Risk for impaired skin integrity will decrease 08/20/2017 1003 - Progressing by Vickey Sages, RN

## 2017-08-20 NOTE — Progress Notes (Signed)
Chart reviewed - noted Type B dissection with medical therapy recommended by CT surgery. BP well controlled today - a-fib rate controlled as well. Off anticoagulation - thrombocytopenia noted. There is mild decline in hemoglobin. Further work-up per medicine service. Currently on po labetalol and diltiazem for rate control - if HR and BP stable today, would consider switching diltiazem to once daily dosing. No further suggestions. Will sign-off. Can follow-up with Dr. Acie Fredrickson after discharge.   Pixie Casino, MD, [facility], [place] Director of the Advanced Lipid Disorders &  Cardiovascular Risk Reduction Clinic Diplomate of the American Board of Clinical Lipidology Attending Cardiologist  Direct Dial: [phone]  Fax: [phone]  Website:  [email or web address]

## 2017-08-20 NOTE — Progress Notes (Signed)
[place] TEAM 1 - [place]  IOX:[id number] DOB: April 27, 1941 DOA: 08/17/2017 PCP: Cyndy Freeze, MD    Brief Narrative:  77 year old male remote smoker (quit in his 64s) w/ a hx of HTN and atrial fibrillation followed by Dr. Cleatrice Burke on [place] who developed thrombocytopenia and was evaluated by the [place], diagnosed w/ ITP, and treated with steroids.    He developed diarrhea, abdominal pain, and back pain 2 weeks prior to this admit.  Diarrhea ultimately cleared, but the abdominal and back pain continued.  He sought medical treatment on at [facility] 08/17/2017 and was found to have an aortic aneurysm with possible dissection.  He was transported to [facility] to the [facility] service with CVTS surgery to evaluate for possible repair of aortic aneurysm.  Significant Events: 2/22 transferred to [facility]   Subjective: Up in chair.  C/o mild B abdom cramping.  Denies cp, n/v, sob, or HA.    Assessment & Plan:  Type B aortic dissection  TCTS recommends medical tx - mild to moderate aneurysmal enlargement of the entire descending thoracic aorta with an acute intramural hematoma beginning at the level of the left subclavian artery and extending all the way to the bifurcation of the infrarenal aorta - no further intpt eval or tx indicated at this time   HTN BP currently well controlled - attempt to transition to long acting CCB in anticipation of d/c home   Thrombocytopenia  Plt cont to trend downward - ?ITP or simply consumption w/ large intramural aortic hematoma - pt was reportedly previously tx as outpt w/ steroids for ITP - if continues to drop will consider short course of steroids   Chronic Afib on Xarelto TCTS recommends no further anticoag until the pt is seen in follow up as an outpt and imaging confirms stability of the aorta   RA ?on chronic steroid - investigate further - no acute joint flares at present   DVT prophylaxis: SCDs Code  Status: FULL CODE Family Communication: no family present at time of exam  Disposition Plan: tele bed - home when plts stable/improving   Consultants:  Cardiology  Vascular Surgery  TCTS  Antimicrobials:  none  Objective: Blood pressure 115/69, pulse 73, temperature 98 F (36.7 C), temperature source Oral, resp. rate (!) 21, height 6' (1.829 m), weight 86.9 kg (191 lb 8 oz), SpO2 98 %.  Intake/Output Summary (Last 24 hours) at 08/20/2017 1421 Last data filed at 08/20/2017 1000 Gross per 24 hour  Intake 780 ml  Output 500 ml  Net 280 ml   Filed Weights   08/19/17 0500 08/20/17 0500 08/20/17 1132  Weight: 86.7 kg (191 lb 3.2 oz) 87.1 kg (192 lb) 86.9 kg (191 lb 8 oz)    Examination: General: No acute respiratory distress Lungs: Clear to auscultation bilaterally without wheezes or crackles Cardiovascular: Regular rate and rhythm without murmur gallop or rub normal S1 and S2 Abdomen: Nontender, nondistended, soft, bowel sounds positive, no rebound, no ascites, no appreciable mass Extremities: No significant cyanosis, clubbing, or edema bilateral lower extremities  CBC: Recent Labs  Lab 08/17/17 1349 08/18/17 0333 08/19/17 1014  WBC 24.2* 15.9* 18.1*  NEUTROABS  --   --  15.8*  HGB 17.4* 15.1 14.9  HCT 51.9 45.1 45.4  MCV 89.8 90.2 90.8  PLT 91* 65* 48*   Basic Metabolic Panel: Recent Labs  Lab 08/17/17 1349 08/18/17 0333 08/19/17 1014  NA 140 137 135  K 4.1 4.2  4.0  CL 102 103 103  CO2 24 23 21*  GLUCOSE 103* 109* 118*  BUN 22* 25* 25*  CREATININE 1.10 1.08 1.15  CALCIUM 8.9 8.4* 8.6*  MG 1.8 1.8  --   PHOS 3.6 4.4  --    GFR: Estimated Creatinine Clearance: 61.9 mL/min (by C-G formula based on SCr of 1.15 mg/dL).  Liver Function Tests: Recent Labs  Lab 08/17/17 1349  AST 47*  ALT 27  ALKPHOS 27*  BILITOT 1.6*  PROT 6.3*  ALBUMIN 3.3*   Recent Labs  Lab 08/17/17 1349  LIPASE 24  AMYLASE 45    Coagulation Profile: Recent Labs  Lab  08/17/17 1349  INR 1.02    Cardiac Enzymes: Recent Labs  Lab 08/17/17 1349 08/17/17 2128 08/18/17 0333  TROPONINI <0.03 <0.03 <0.03    HbA1C: Hgb A1c MFr Bld  Date/Time Value Ref Range Status  08/18/2017 03:33 AM 5.5 4.8 - 5.6 % Final    Comment:    (NOTE)         Prediabetes: 5.7 - 6.4         Diabetes: >6.4         Glycemic control for adults with diabetes: <7.0     Recent Results (from the past 240 hour(s))  Surgical pcr screen     Status: None   Collection Time: 08/17/17 11:17 AM  Result Value Ref Range Status   MRSA, PCR NEGATIVE NEGATIVE Final   Staphylococcus aureus NEGATIVE NEGATIVE Final    Comment: (NOTE) The Xpert SA Assay (FDA approved for NASAL specimens in patients 75 years of age and older), is one component of a comprehensive surveillance program. It is not intended to diagnose infection nor to guide or monitor treatment. Performed at [facility], [place] [address], [place], [place] [address]   Urine culture     Status: Abnormal   Collection Time: 08/17/17 12:09 PM  Result Value Ref Range Status   Specimen Description URINE, CLEAN CATCH  Final   Special Requests NONE  Final   Culture (A)  Final    <10,000 COLONIES/mL INSIGNIFICANT GROWTH Performed at [facility], [place] [address] [place], [place] [address]    Report Status 08/18/2017 FINAL  Final  Culture, blood (routine x 2)     Status: None (Preliminary result)   Collection Time: 08/17/17  1:40 PM  Result Value Ref Range Status   Specimen Description BLOOD LEFT ANTECUBITAL  Final   Special Requests   Final    BOTTLES DRAWN AEROBIC AND ANAEROBIC Blood Culture results may not be optimal due to an inadequate volume of blood received in culture bottles   Culture   Final    NO GROWTH 2 DAYS Performed at [facility], [place] [address], [place], [place] [address]    Report Status PENDING  Incomplete  Culture, blood (routine x 2)     Status: None (Preliminary result)   Collection  Time: 08/17/17  1:52 PM  Result Value Ref Range Status   Specimen Description BLOOD LEFT HAND  Final   Special Requests   Final    BOTTLES DRAWN AEROBIC AND ANAEROBIC Blood Culture adequate volume   Culture   Final    NO GROWTH 2 DAYS Performed at [facility], [place] [address], [place], [place] [address]    Report Status PENDING  Incomplete     Scheduled Meds: . diltiazem  30 mg Oral QID  . labetalol  200 mg  Oral BID  . pantoprazole  40 mg Oral Daily     LOS: 3 days   Cherene Altes, MD [facility] Office  [phone] Pager - Text Page per Amion as per below:  On-Call/Text Page:      [email or web address]      password [email or web address]  If 7PM-7AM, please contact night-coverage [email or web address] Password [email or web address] 08/20/2017, 2:21 PM

## 2017-08-20 NOTE — Progress Notes (Signed)
VASCULAR LAB PRELIMINARY  PRELIMINARY  PRELIMINARY  PRELIMINARY     Popliteal artery has normal triphasic waveforms with no evidence of aneurysmal dilatation.  Evidence of mild to moderate, irregular heterogenous plaque, noted.  Popliteal veins exhibit significant sluggish flow.  Murdock Jellison, RVT 08/20/2017, 8:09 AM

## 2017-08-21 LAB — URINALYSIS, ROUTINE W REFLEX MICROSCOPIC
Bacteria, UA: NONE SEEN
Bilirubin Urine: NEGATIVE
Glucose, UA: NEGATIVE mg/dL
Ketones, ur: NEGATIVE mg/dL
Leukocytes, UA: NEGATIVE
Nitrite: NEGATIVE
PROTEIN: NEGATIVE mg/dL
SPECIFIC GRAVITY, URINE: 1.02 (ref 1.005–1.030)
Squamous Epithelial / LPF: NONE SEEN
pH: 5 (ref 5.0–8.0)

## 2017-08-21 LAB — COMPREHENSIVE METABOLIC PANEL
ALK PHOS: 28 U/L — AB (ref 38–126)
ALT: 20 U/L (ref 17–63)
AST: 20 U/L (ref 15–41)
Albumin: 2.1 g/dL — ABNORMAL LOW (ref 3.5–5.0)
Anion gap: 8 (ref 5–15)
BUN: 30 mg/dL — AB (ref 6–20)
CALCIUM: 8.3 mg/dL — AB (ref 8.9–10.3)
CHLORIDE: 100 mmol/L — AB (ref 101–111)
CO2: 27 mmol/L (ref 22–32)
CREATININE: 1.22 mg/dL (ref 0.61–1.24)
GFR calc Af Amer: >60 mL/min (ref >60)
GFR, EST NON AFRICAN AMERICAN: 57 mL/min — AB (ref >60)
Glucose, Bld: 92 mg/dL (ref 65–99)
Potassium: 4 mmol/L (ref 3.5–5.1)
SODIUM: 135 mmol/L (ref 135–145)
Total Bilirubin: 2.2 mg/dL — ABNORMAL HIGH (ref 0.3–1.2)
Total Protein: 5.3 g/dL — ABNORMAL LOW (ref 6.5–8.1)

## 2017-08-21 LAB — CBC
HEMATOCRIT: 37.7 % — AB (ref 39.0–52.0)
HEMOGLOBIN: 12.4 g/dL — AB (ref 13.0–17.0)
MCH: 29.7 pg (ref 26.0–34.0)
MCHC: 32.9 g/dL (ref 30.0–36.0)
MCV: 90.2 fL (ref 78.0–100.0)
Platelets: 48 10*3/uL — ABNORMAL LOW (ref 150–400)
RBC: 4.18 MIL/uL — AB (ref 4.22–5.81)
RDW: 15.3 % (ref 11.5–15.5)
WBC: 11.9 10*3/uL — ABNORMAL HIGH (ref 4.0–10.5)

## 2017-08-21 LAB — LACTATE DEHYDROGENASE: LDH: 169 U/L (ref 98–192)

## 2017-08-21 MED ORDER — MAGNESIUM GLUCONATE 500 MG PO TABS
500.0000 mg | ORAL_TABLET | Freq: Every day | ORAL | Status: DC
  Administered 2017-08-21 – 2017-08-22 (×2): 500 mg via ORAL
  Filled 2017-08-21 (×3): qty 1

## 2017-08-21 MED ORDER — HYDROCOD POLST-CPM POLST ER 10-8 MG/5ML PO SUER
5.0000 mL | Freq: Once | ORAL | Status: AC
  Administered 2017-08-21: 5 mL via ORAL
  Filled 2017-08-21: qty 5

## 2017-08-21 MED ORDER — SODIUM CHLORIDE 0.9 % IV BOLUS (SEPSIS)
500.0000 mL | Freq: Once | INTRAVENOUS | Status: AC
  Administered 2017-08-21: 500 mL via INTRAVENOUS

## 2017-08-21 MED ORDER — PREDNISONE 20 MG PO TABS: 20.0000 mg | ORAL_TABLET | Freq: Two times a day (BID) | ORAL | Status: DC

## 2017-08-21 MED ORDER — PREDNISONE 20 MG PO TABS
20.0000 mg | ORAL_TABLET | Freq: Two times a day (BID) | ORAL | Status: DC
Start: 2017-08-21 — End: 2017-08-22
  Administered 2017-08-21 – 2017-08-22 (×3): 20 mg via ORAL
  Filled 2017-08-21 (×3): qty 1

## 2017-08-21 NOTE — Progress Notes (Addendum)
  Progress Note    08/21/2017 8:05 AM Hospital Day 4  Subjective:  Still sore around the left side of his lower chest  BP well controlled 100's-110's  Afebrile HR 60's-70's Afib 98% RA  Vitals:   08/21/17 0048 08/21/17 0530  BP: 109/63 113/63  Pulse: 69 73  Resp:  16  Temp: 97.9 F (36.6 C) 97.8 F (36.6 C)  SpO2: 95% 98%    Physical Exam: General:  No distress; sitting up eating breakfast Lungs:  Non labored  CBC    Component Value Date/Time   WBC 11.9 (H) 08/21/2017 0501   RBC 4.18 (L) 08/21/2017 0501   HGB 12.4 (L) 08/21/2017 0501   HGB 17.1 09/04/2016 0954   HCT 37.7 (L) 08/21/2017 0501   HCT 47.9 09/04/2016 0954   PLT 48 (L) 08/21/2017 0501   PLT 155 09/04/2016 0954   MCV 90.2 08/21/2017 0501   MCV 85 09/04/2016 0954   MCH 29.7 08/21/2017 0501   MCHC 32.9 08/21/2017 0501   RDW 15.3 08/21/2017 0501   RDW 13.8 09/04/2016 0954   LYMPHSABS 0.9 08/19/2017 1014   LYMPHSABS 1.5 09/04/2016 0954   MONOABS 1.4 (H) 08/19/2017 1014   EOSABS 0.0 08/19/2017 1014   EOSABS 0.1 09/04/2016 0954   BASOSABS 0.0 08/19/2017 1014   BASOSABS 0.0 09/04/2016 0954    BMET    Component Value Date/Time   NA 135 08/21/2017 0501   NA 141 09/04/2016 0954   K 4.0 08/21/2017 0501   CL 100 (L) 08/21/2017 0501   CO2 27 08/21/2017 0501   GLUCOSE 92 08/21/2017 0501   BUN 30 (H) 08/21/2017 0501   BUN 15 09/04/2016 0954   CREATININE 1.22 08/21/2017 0501   CREATININE 1.10 08/31/2015 1007   CALCIUM 8.3 (L) 08/21/2017 0501   GFRNONAA 57 (L) 08/21/2017 0501   GFRAA >60 08/21/2017 0501    INR    Component Value Date/Time   INR 1.02 08/17/2017 1349     Intake/Output Summary (Last 24 hours) at 08/21/2017 0805 Last data filed at 08/21/2017 0533 Gross per 24 hour  Intake 600 ml  Output 300 ml  Net 300 ml     Assessment/Plan:  77 y.o. male aortic intramural hematoma  Hospital Day 4  -blood pressure well controlled -thrombocytopenia stable from 2 days ago at 48k-workup per  primary team -afib-rate controlled -for outpatient imaging per Dr. Gregor Hams, PA-C [facility] [phone] 08/21/2017 8:05 AM  I have independently interviewed and examined patient and agree with PA assessment and plan above. Doing well on po meds from htn and rate standpoint. Pain is well controlled. Will plan f/u CT in 4-6 weeks as outpatient. Sequestration of platelets in imh could contribute to thrombocytopenia but given that this was present prior to any diagnosed imh unlikely only factor.   Lula Michaux C. Donzetta Matters, MD [facility] [place] Office: [phone] Pager: [phone]

## 2017-08-21 NOTE — Plan of Care (Signed)
  Progressing Health Behavior/Discharge Planning: Ability to manage health-related needs will improve 08/21/2017 1125 - Progressing by Ottie Glazier, RN Clinical Measurements: Ability to maintain clinical measurements within normal limits will improve 08/21/2017 1125 - Progressing by Ottie Glazier, RN Coping: Level of anxiety will decrease 08/21/2017 1125 - Progressing by Ottie Glazier, RN

## 2017-08-21 NOTE — Progress Notes (Signed)
[place] TEAM 1 - [place]  OVF:[id number] DOB: 08-Jul-1940 DOA: 08/17/2017 PCP: Cyndy Freeze, MD    Brief Narrative:  77 year old male remote smoker (quit in his 6s) w/ a hx of HTN and atrial fibrillation followed by Dr. Cleatrice Burke on [place] who developed thrombocytopenia and was evaluated by the [place], diagnosed w/ ITP, and treated with steroids.    He developed diarrhea, abdominal pain, and back pain 2 weeks prior to this admit.  Diarrhea ultimately cleared, but the abdominal and back pain continued.  He sought medical treatment on at [facility] 08/17/2017 and was found to have an aortic aneurysm with possible dissection.  He was transported to [facility] to the [facility] service with CVTS surgery to evaluate for possible repair of aortic aneurysm.  Significant Events: 2/22 transferred to [facility]   Subjective: Pt reports crampy upper quad abdom pain today, just like yesterday but in slightly different location.  Denies cp, sob, n/v, or HA.    Assessment & Plan:  Type B aortic dissection  TCTS recommends medical tx - mild to moderate aneurysmal enlargement of the entire descending thoracic aorta with an acute intramural hematoma beginning at the level of the left subclavian artery and extending all the way to the bifurcation of the infrarenal aorta - no further intpt eval or tx indicated at this time   HTN BP currently well controlled - transitioned to long acting CCB in anticipation of d/c home - follow BP w/ resumption of steroid    Thrombocytopenia  Plt appear to have reached a nadir - likely a combination of ITP + consumption w/ large intramural aortic hematoma - pt was previously being tx as outpt w/ steroids for ITP on a very slow taper - resume prior steroid dose and follow - if plt count begins to rise will d/c home   Chronic Afib on Xarelto TCTS recommends no further anticoag until the pt is seen in follow up as an outpt and  imaging confirms stability of the aorta   RA no acute joint flares at present   DVT prophylaxis: SCDs Code Status: FULL CODE Family Communication: no family present at time of exam  Disposition Plan: tele bed - home when plts improving   Consultants:  Cardiology  Vascular Surgery  TCTS  Antimicrobials:  none  Objective: Blood pressure 118/68, pulse 89, temperature 97.8 F (36.6 C), temperature source Oral, resp. rate 16, height 6' (1.829 m), weight 86.5 kg (190 lb 12.8 oz), SpO2 98 %.  Intake/Output Summary (Last 24 hours) at 08/21/2017 1208 Last data filed at 08/21/2017 0956 Gross per 24 hour  Intake 360 ml  Output 300 ml  Net 60 ml   Filed Weights   08/20/17 0500 08/20/17 1132 08/21/17 0530  Weight: 87.1 kg (192 lb) 86.9 kg (191 lb 8 oz) 86.5 kg (190 lb 12.8 oz)    Examination: General: No acute respiratory distress Lungs: CTA B w/o wheezing  Cardiovascular: Regular rate and rhythm without murmur  Abdomen: NT/ND, soft, bs+, no mass  Extremities: No signif edema bilateral lower extremities  CBC: Recent Labs  Lab 08/17/17 1349 08/18/17 0333 08/19/17 1014 08/21/17 0501  WBC 24.2* 15.9* 18.1* 11.9*  NEUTROABS  --   --  15.8*  --   HGB 17.4* 15.1 14.9 12.4*  HCT 51.9 45.1 45.4 37.7*  MCV 89.8 90.2 90.8 90.2  PLT 91* 65* 48* 48*   Basic Metabolic Panel: Recent Labs  Lab 08/17/17 1349 08/18/17  3710 08/19/17 1014 08/21/17 0501  NA 140 137 135 135  K 4.1 4.2 4.0 4.0  CL 102 103 103 100*  CO2 24 23 21* 27  GLUCOSE 103* 109* 118* 92  BUN 22* 25* 25* 30*  CREATININE 1.10 1.08 1.15 1.22  CALCIUM 8.9 8.4* 8.6* 8.3*  MG 1.8 1.8  --   --   PHOS 3.6 4.4  --   --    GFR: Estimated Creatinine Clearance: 58.3 mL/min (by C-G formula based on SCr of 1.22 mg/dL).  Liver Function Tests: Recent Labs  Lab 08/17/17 1349 08/21/17 0501  AST 47* 20  ALT 27 20  ALKPHOS 27* 28*  BILITOT 1.6* 2.2*  PROT 6.3* 5.3*  ALBUMIN 3.3* 2.1*   Recent Labs  Lab  08/17/17 1349  LIPASE 24  AMYLASE 45    Coagulation Profile: Recent Labs  Lab 08/17/17 1349  INR 1.02    Cardiac Enzymes: Recent Labs  Lab 08/17/17 1349 08/17/17 2128 08/18/17 0333  TROPONINI <0.03 <0.03 <0.03    HbA1C: Hgb A1c MFr Bld  Date/Time Value Ref Range Status  08/18/2017 03:33 AM 5.5 4.8 - 5.6 % Final    Comment:    (NOTE)         Prediabetes: 5.7 - 6.4         Diabetes: >6.4         Glycemic control for adults with diabetes: <7.0     Recent Results (from the past 240 hour(s))  Surgical pcr screen     Status: None   Collection Time: 08/17/17 11:17 AM  Result Value Ref Range Status   MRSA, PCR NEGATIVE NEGATIVE Final   Staphylococcus aureus NEGATIVE NEGATIVE Final    Comment: (NOTE) The Xpert SA Assay (FDA approved for NASAL specimens in patients 42 years of age and older), is one component of a comprehensive surveillance program. It is not intended to diagnose infection nor to guide or monitor treatment. Performed at [facility], [place] [address], [place], [place] [address]   Urine culture     Status: Abnormal   Collection Time: 08/17/17 12:09 PM  Result Value Ref Range Status   Specimen Description URINE, CLEAN CATCH  Final   Special Requests NONE  Final   Culture (A)  Final    <10,000 COLONIES/mL INSIGNIFICANT GROWTH Performed at [facility], [place] [address], [place], [place] [address]    Report Status 08/18/2017 FINAL  Final  Culture, blood (routine x 2)     Status: None (Preliminary result)   Collection Time: 08/17/17  1:40 PM  Result Value Ref Range Status   Specimen Description BLOOD LEFT ANTECUBITAL  Final   Special Requests   Final    BOTTLES DRAWN AEROBIC AND ANAEROBIC Blood Culture results may not be optimal due to an inadequate volume of blood received in culture bottles   Culture   Final    NO GROWTH 3 DAYS Performed at [facility], [place] [address] [place], [place] [address]    Report Status PENDING   Incomplete  Culture, blood (routine x 2)     Status: None (Preliminary result)   Collection Time: 08/17/17  1:52 PM  Result Value Ref Range Status   Specimen Description BLOOD LEFT HAND  Final   Special Requests   Final    BOTTLES DRAWN AEROBIC AND ANAEROBIC Blood Culture adequate volume   Culture   Final    NO GROWTH 3 DAYS Performed at [facility], [place] [address], [place],  [address]    Report Status PENDING  Incomplete     Scheduled Meds: . diltiazem  120 mg Oral Daily  . labetalol  200 mg Oral BID  . pantoprazole  40 mg Oral Daily     LOS: 4 days   Cherene Altes, MD [facility] Office  [phone] Pager - Text Page per Amion as per below:  On-Call/Text Page:      [email or web address]      password [email or web address]  If 7PM-7AM, please contact night-coverage [email or web address] Password [facility] 08/21/2017, 12:08 PM

## 2017-08-22 DIAGNOSIS — I7102 Dissection of abdominal aorta: Secondary | ICD-10-CM

## 2017-08-22 LAB — CBC
HEMATOCRIT: 36.3 % — AB (ref 39.0–52.0)
HEMOGLOBIN: 11.8 g/dL — AB (ref 13.0–17.0)
MCH: 29.5 pg (ref 26.0–34.0)
MCHC: 32.5 g/dL (ref 30.0–36.0)
MCV: 90.8 fL (ref 78.0–100.0)
Platelets: 64 10*3/uL — ABNORMAL LOW (ref 150–400)
RBC: 4 MIL/uL — AB (ref 4.22–5.81)
RDW: 14.9 % (ref 11.5–15.5)
WBC: 8.5 10*3/uL (ref 4.0–10.5)

## 2017-08-22 LAB — CULTURE, BLOOD (ROUTINE X 2)
Culture: NO GROWTH
Culture: NO GROWTH
SPECIAL REQUESTS: ADEQUATE

## 2017-08-22 LAB — BASIC METABOLIC PANEL
ANION GAP: 8 (ref 5–15)
BUN: 26 mg/dL — ABNORMAL HIGH (ref 6–20)
CO2: 25 mmol/L (ref 22–32)
Calcium: 8.4 mg/dL — ABNORMAL LOW (ref 8.9–10.3)
Chloride: 103 mmol/L (ref 101–111)
Creatinine, Ser: 1.04 mg/dL (ref 0.61–1.24)
GFR calc Af Amer: >60 mL/min (ref >60)
GFR calc non Af Amer: >60 mL/min (ref >60)
GLUCOSE: 102 mg/dL — AB (ref 65–99)
POTASSIUM: 3.8 mmol/L (ref 3.5–5.1)
Sodium: 136 mmol/L (ref 135–145)

## 2017-08-22 MED ORDER — LABETALOL HCL 200 MG PO TABS: 200.0000 mg | ORAL_TABLET | Freq: Two times a day (BID) | ORAL | 1 refills | Status: AC

## 2017-08-22 MED ORDER — DILTIAZEM HCL ER COATED BEADS 120 MG PO CP24: 120.0000 mg | ORAL_CAPSULE | Freq: Every day | ORAL | 1 refills | Status: DC

## 2017-08-22 NOTE — Progress Notes (Signed)
Patient resting comfortably during shift report. Denies complaints.  

## 2017-08-22 NOTE — Progress Notes (Signed)
  Progress Note    08/22/2017 7:51 AM   Subjective:  No issues  Vitals:   08/21/17 2022 08/22/17 0616  BP: 123/76 129/71  Pulse: 78 73  Resp: 18 18  Temp: 98 F (36.7 C) 97.7 F (36.5 C)  SpO2: 100% 97%    Physical Exam: aaox3 Abdomen is soft 2+ palpable popliteal pulses  CBC    Component Value Date/Time   WBC 8.5 08/22/2017 0441   RBC 4.00 (L) 08/22/2017 0441   HGB 11.8 (L) 08/22/2017 0441   HGB 17.1 09/04/2016 0954   HCT 36.3 (L) 08/22/2017 0441   HCT 47.9 09/04/2016 0954   PLT 64 (L) 08/22/2017 0441   PLT 155 09/04/2016 0954   MCV 90.8 08/22/2017 0441   MCV 85 09/04/2016 0954   MCH 29.5 08/22/2017 0441   MCHC 32.5 08/22/2017 0441   RDW 14.9 08/22/2017 0441   RDW 13.8 09/04/2016 0954   LYMPHSABS 0.9 08/19/2017 1014   LYMPHSABS 1.5 09/04/2016 0954   MONOABS 1.4 (H) 08/19/2017 1014   EOSABS 0.0 08/19/2017 1014   EOSABS 0.1 09/04/2016 0954   BASOSABS 0.0 08/19/2017 1014   BASOSABS 0.0 09/04/2016 0954    BMET    Component Value Date/Time   NA 136 08/22/2017 0441   NA 141 09/04/2016 0954   K 3.8 08/22/2017 0441   CL 103 08/22/2017 0441   CO2 25 08/22/2017 0441   GLUCOSE 102 (H) 08/22/2017 0441   BUN 26 (H) 08/22/2017 0441   BUN 15 09/04/2016 0954   CREATININE 1.04 08/22/2017 0441   CREATININE 1.10 08/31/2015 1007   CALCIUM 8.4 (L) 08/22/2017 0441   GFRNONAA >60 08/22/2017 0441   GFRAA >60 08/22/2017 0441    INR    Component Value Date/Time   INR 1.02 08/17/2017 1349     Intake/Output Summary (Last 24 hours) at 08/22/2017 0751 Last data filed at 08/22/2017 8502 Gross per 24 hour  Intake 600 ml  Output 2370 ml  Net -1770 ml     Assessment/Plan:  77 y.o. male aortic intramural hematoma Hospital Day 5  -blood pressure well controlled -thrombocytopenia improving -afib-rate controlled -plan for CTA in 4-5 weeks in office. Instructed patient to regularly check bp at home. If pain returns will need to have earlier scan and he understands  to return [facility] ED with any further symptoms.   Emilyanne Mcgough C. Donzetta Matters, MD [facility] [place] Office: [phone] Pager: [phone]  08/22/2017 7:51 AM

## 2017-08-22 NOTE — Discharge Instructions (Signed)
Aortic Dissection  An aortic dissection happens when there is a tear in the main blood vessel of the body (aorta). The aorta comes out of the heart, curves around, and then goes down the chest (thoracic aorta) and into the abdomen (abdominal aorta) to supply arteries with blood. The wall of the aorta has inner and outer layers. Aortic dissection occurs most often in the thoracic aorta. As the tear widens and blood flows through it, the aorta becomes double-barreled. This means that one part of the aorta continues to carry blood to the body, but blood also flows into the tear, between the layers of the aorta. The torn part of the aorta fills with blood and swells up. This can reduce blood flow through the part of the aorta that is still supplying blood to the body. Aortic dissection is a medical emergency. What are the causes? An aortic dissection is commonly caused by weakening of the artery wall due to high blood pressure. Other causes may include:  An injury, such as from a car crash.  Birth defects that affect the heart (congenital heart defects).  Thickening of the artery walls.  In some cases, the cause is not known. What increases the risk? The following factors may make you more likely to develop this condition:  Having certain medical conditions, such as: ? High blood pressure (hypertension). ? Hardening and narrowing of the arteries (atherosclerosis). ? A genetic disorder that affects the connective tissue, such as Marfan syndrome or Ehlers-Danlos syndrome. ? A condition that causes inflammation of blood vessels, such as giant cell arteritis.  Having a chest injury.  Having surgery on the aorta.  Being born with a congenital heart defect.  Being male.  Being older than age 20.  Using cocaine.  Smoking.  Lifting heavy weights or doing other types of high-intensity resistance training.  What are the signs or symptoms? Signs and symptoms of aortic dissection start  suddenly. The most common symptoms are:  Severe chest pain that may feel like tearing, stabbing, or sharp pain.  Severe pain that spreads (radiates) to the back, neck, jaw, or abdomen.  Other symptoms may include:  Trouble breathing.  Dizziness or fainting.  Sudden weakness on one side of the body.  Nausea or vomiting.  Trouble swallowing.  Coughing up blood.  Vomiting blood.  Clammy skin.  How is this diagnosed? This condition may be diagnosed based on:  Your symptoms.  A physical exam. This may include: ? Listening for abnormal blood flow sounds (murmurs) in your chest or abdomen. ? Checking your pulse in your arms and legs. ? Checking your blood pressure to see whether it is low or whether there is a difference between the measurements in your right and left arm.  Electrocardiogram (ECG). This test measures the electrical activity in your heart.  Chest X-ray.  CT scan.  MRI.  Aortic angiogram. This test involves injecting dye to make it easier to see your blood vessels clearly.  Echocardiogram to study your heart using sound waves.  Blood tests.  How is this treated? It is important to treat an aortic dissection as quickly as possible. Treatment may start as soon as your health care provider thinks that you have aortic dissection. Treatment depends on the location and severity of your dissection and your overall health. Treatment may include:  Medicines to lower your blood pressure.  Surgery to repair the dissected part of your aorta with artificial material (syntheticgraft).  A medical procedure to insert a stent-graft  into the aorta (endovascular procedure). During this procedure, a long, thin tube (stent) is inserted into an artery near the groin (femoral artery) and moved up to the damaged part of the aorta. Then, the stent is opened to help improve blood flow and prevent future dissection.  Follow these instructions at home: Activity  Avoid  activities that could injure your chest or your abdomen. Ask your health care provider what activities are safe for you.  After you have recovered, try to stay active. Ask your health care provider what activities are safe for you after recovery.  Do not lift anything that is heavier than 10 lb (4.5 kg) until your health care provider approves.  Do not drive or use heavy machinery while taking prescription pain medicine. Eating and drinking  Eat a heart-healthy diet, which includes lots of fresh fruits and vegetables, low-fat (lean) protein, and whole grains.  Check ingredients and nutrition facts on packaged foods and beverages, and avoid foods with high amounts of: ? Salt (sodium). ? Saturated fats (like red meat). ? Trans fats (like fried food). General instructions  Take over-the-counter and prescription medicines only as told by your health care provider.  Work with your health care provider to manage your blood pressure.  Talk with your health care provider about how to manage stress.  Do not use any products that contain nicotine or tobacco, such as cigarettes and e-cigarettes. If you need help quitting, ask your health care provider.  Keep all follow-up visits as told by your health care provider. This is important. Get help right away if:  You develop any symptoms of aortic dissection after treatment, including severe pain in your chest, back, or abdomen.  You have a pain in your abdomen.  You have trouble breathing or you develop a cough.  You faint.  You develop a racing heartbeat. These symptoms may represent a serious problem that is an emergency. Do not wait to see if the symptoms will go away. Get medical help right away. Call your local emergency services (911 in the [place] Do not drive yourself to the hospital. Summary  An aortic dissection happens when there is a tear in the main blood vessel of the body (aorta). It is a medical emergency.  The most common  symptom is severe pain in the chest that spreads (radiates) to the back, neck, jaw, or abdomen.  It is important to treat an aortic dissection as quickly as possible. Treatment typically includes surgery and medicines. This information is not intended to replace advice given to you by your health care provider. Make sure you discuss any questions you have with your health care provider. Document Released: 09/19/2007 Document Revised: 05/01/2016 Document Reviewed: 05/01/2016 Elsevier Interactive Patient Education  2017 [place] Heart-healthy meal planning includes:  Limiting unhealthy fats.  Increasing healthy fats.  Making other small dietary changes.  You may need to talk with your doctor or a diet specialist (dietitian) to create an eating plan that is right for you. What types of fat should I choose?  Choose healthy fats. These include olive oil and canola oil, flaxseeds, walnuts, almonds, and seeds.  Eat more omega-3 fats. These include salmon, mackerel, sardines, tuna, flaxseed oil, and ground flaxseeds. Try to eat fish at least twice each week.  Limit saturated fats. ? Saturated fats are often found in animal products, such as meats, butter, and cream. ? Plant sources of saturated fats include palm oil, palm kernel oil, and  coconut oil.  Avoid foods with partially hydrogenated oils in them. These include stick margarine, some tub margarines, cookies, crackers, and other baked goods. These contain trans fats. What general guidelines do I need to follow?  Check food labels carefully. Identify foods with trans fats or high amounts of saturated fat.  Fill one half of your plate with vegetables and green salads. Eat 4-5 servings of vegetables per day. A serving of vegetables is: ? 1 cup of raw leafy vegetables. ?  cup of raw or cooked cut-up vegetables. ?  cup of vegetable juice.  Fill one fourth of your plate with whole grains. Look for the  word "whole" as the first word in the ingredient list.  Fill one fourth of your plate with lean protein foods.  Eat 4-5 servings of fruit per day. A serving of fruit is: ? One medium whole fruit. ?  cup of dried fruit. ?  cup of fresh, frozen, or canned fruit. ?  cup of 100% fruit juice.  Eat more foods that contain soluble fiber. These include apples, broccoli, carrots, beans, peas, and barley. Try to get 20-30 g of fiber per day.  Eat more home-cooked food. Eat less restaurant, buffet, and fast food.  Limit or avoid alcohol.  Limit foods high in starch and sugar.  Avoid fried foods.  Avoid frying your food. Try baking, boiling, grilling, or broiling it instead. You can also reduce fat by: ? Removing the skin from poultry. ? Removing all visible fats from meats. ? Skimming the fat off of stews, soups, and gravies before serving them. ? Steaming vegetables in water or broth.  Lose weight if you are overweight.  Eat 4-5 servings of nuts, legumes, and seeds per week: ? One serving of dried beans or legumes equals  cup after being cooked. ? One serving of nuts equals 1 ounces. ? One serving of seeds equals  ounce or one tablespoon.  You may need to keep track of how much salt or sodium you eat. This is especially true if you have high blood pressure. Talk with your doctor or dietitian to get more information. What foods can I eat? Grains Breads, including [place], white, pita, wheat, raisin, rye, oatmeal, and [place]. Tortillas that are neither fried nor made with lard or trans fat. Low-fat rolls, including hotdog and hamburger buns and English muffins. Biscuits. Muffins. Waffles. Pancakes. Light popcorn. Whole-grain cereals. Flatbread. Melba toast. Pretzels. Breadsticks. Rusks. Low-fat snacks. Low-fat crackers, including oyster, saltine, matzo, graham, animal, and rye. Rice and pasta, including brown rice and pastas that are made with whole wheat. Vegetables All  vegetables. Fruits All fruits, but limit coconut. Meats and Other Protein Sources Lean, well-trimmed beef, veal, pork, and lamb. Chicken and [place] without skin. All fish and shellfish. Wild duck, rabbit, pheasant, and venison. Egg whites or low-cholesterol egg substitutes. Dried beans, peas, lentils, and tofu. Seeds and most nuts. Dairy Low-fat or nonfat cheeses, including ricotta, string, and mozzarella. Skim or 1% milk that is liquid, powdered, or evaporated. Buttermilk that is made with low-fat milk. Nonfat or low-fat yogurt. Beverages Mineral water. Diet carbonated beverages. Sweets and Desserts Sherbets and fruit ices. Honey, jam, marmalade, jelly, and syrups. Meringues and gelatins. Pure sugar candy, such as hard candy, jelly beans, gumdrops, mints, marshmallows, and small amounts of dark chocolate. [employer] Eat all sweets and desserts in moderation. Fats and Oils Nonhydrogenated (trans-free) margarines. Vegetable oils, including soybean, sesame, sunflower, olive, peanut, safflower, corn, canola, and cottonseed. Salad dressings  or mayonnaise made with a vegetable oil. Limit added fats and oils that you use for cooking, baking, salads, and as spreads. Other Cocoa powder. Coffee and tea. All seasonings and condiments. The items listed above may not be a complete list of recommended foods or beverages. Contact your dietitian for more options. What foods are not recommended? Grains Breads that are made with saturated or trans fats, oils, or whole milk. Croissants. Butter rolls. Cheese breads. Sweet rolls. Donuts. Buttered popcorn. Chow mein noodles. High-fat crackers, such as cheese or butter crackers. Meats and Other Protein Sources Fatty meats, such as hotdogs, short ribs, sausage, spareribs, bacon, rib eye roast or steak, and mutton. High-fat deli meats, such as salami and bologna. Caviar. Domestic duck and goose. Organ meats, such as kidney, liver, sweetbreads, and  heart. Dairy Cream, sour cream, cream cheese, and creamed cottage cheese. Whole-milk cheeses, including blue (bleu), Monte[place] Jack, [place], [place], [place], [place], Swiss, cheddar, [place], and [place]. Whole or 2% milk that is liquid, evaporated, or condensed. Whole buttermilk. Cream sauce or high-fat cheese sauce. Yogurt that is made from whole milk. Beverages Regular sodas and juice drinks with added sugar. Sweets and Desserts Frosting. Pudding. Cookies. Cakes other than angel food cake. Candy that has milk chocolate or white chocolate, hydrogenated fat, butter, coconut, or unknown ingredients. Buttered syrups. Full-fat ice cream or ice cream drinks. Fats and Oils Gravy that has suet, meat fat, or shortening. Cocoa butter, hydrogenated oils, palm oil, coconut oil, palm kernel oil. These can often be found in baked products, candy, fried foods, nondairy creamers, and whipped toppings. Solid fats and shortenings, including bacon fat, salt pork, lard, and butter. Nondairy cream substitutes, such as coffee creamers and sour cream substitutes. Salad dressings that are made of unknown oils, cheese, or sour cream. The items listed above may not be a complete list of foods and beverages to avoid. Contact your dietitian for more information. This information is not intended to replace advice given to you by your health care provider. Make sure you discuss any questions you have with your health care provider. Document Released: 12/12/2011 Document Revised: 11/18/2015 Document Reviewed: 12/04/2013 Elsevier Interactive Patient Education  [employer].

## 2017-08-22 NOTE — Progress Notes (Signed)
      [address]       [place] [address]             [phone]     CARDIOTHORACIC SURGERY PROGRESS NOTE  Subjective: Feels well.  Hoping to go home  Objective: Vital signs in last 24 hours: Temp:  [97.7 F (36.5 C)-98 F (36.7 C)] 97.7 F (36.5 C) (02/27 0616) Pulse Rate:  [68-78] 73 (02/27 0616) Resp:  [18] 18 (02/27 0616) BP: (123-129)/(71-76) 129/71 (02/27 0616) SpO2:  [97 %-100 %] 97 % (02/27 0616) Weight:  [189 lb 12.8 oz (86.1 kg)] 189 lb 12.8 oz (86.1 kg) (02/27 0616)  Physical Exam:  Breath sounds: clear  Heart sounds:  RRR  Incisions:  n/a  Abdomen:  soft  Extremities:  Warm, pulses intact   Intake/Output from previous day: 02/26 0701 - 02/27 0700 In: 600 [P.O.:600] Out: 2370 [Urine:2370] Intake/Output this shift: Total I/O In: 240 [P.O.:240] Out: -   Lab Results: Recent Labs    08/21/17 0501 08/22/17 0441  WBC 11.9* 8.5  HGB 12.4* 11.8*  HCT 37.7* 36.3*  PLT 48* 64*   BMET:  Recent Labs    08/21/17 0501 08/22/17 0441  NA 135 136  K 4.0 3.8  CL 100* 103  CO2 27 25  GLUCOSE 92 102*  BUN 30* 26*  CREATININE 1.22 1.04  CALCIUM 8.3* 8.4*    CBG (last 3)  No results for input(s): GLUCAP in the last 72 hours. PT/INR:  No results for input(s): LABPROT, INR in the last 72 hours.  CXR:  N/A  Assessment/Plan:  BP well controlled and abdominal pain has resolved. I agree w/ plans outlined by Dr Donzetta Matters. Will defer long term f/u of [facility] to Dr Donzetta Matters and his colleagues at [facility]  I spent in excess of 15 minutes during the conduct of this hospital encounter and >50% of this time involved direct face-to-face encounter with the patient for counseling and/or coordination of their care.   Rexene Alberts, MD 08/22/2017 10:21 AM

## 2017-08-22 NOTE — Care Management Important Message (Signed)
Important Message  Patient Details  Name: Jerome Sullivan MRN: [id number] Date of Birth: 05/10/1941   Medicare Important Message Given:  Yes    Orbie Pyo 08/22/2017, 3:55 PM

## 2017-08-22 NOTE — Progress Notes (Signed)
Patient discharged via wheelchair, escorted to family vehicle by NT. Pt c/a/ox4 denying complaints at time of discharge. All belongings, discharge paperwork/prescriptions were in pt's possession at time of discharge.

## 2017-08-22 NOTE — Discharge Summary (Signed)
DISCHARGE SUMMARY  Jerome Sullivan  MR#: [id number]  DOB:Mar 25, 1941  Date of Admission: 08/17/2017 Date of Discharge: 08/22/2017  Attending Physician:Carrell Palmatier Hennie Duos, MD   Patient's CWC:[id number], Jerome Edelman, MD  Consults: Cardiology  Vascular Surgery  TCTS  Disposition: D/C home   Follow-up Appts: Follow-up Information    Cyndy Freeze, MD Follow up in 1 week(s).   Specialty:  Family Medicine Contact information: [place] [address] [phone]        Waynetta Sandy, MD Follow up.   Specialties:  Vascular Surgery, Cardiology Why:  The office will call you to arrange a follow up appointment.   Contact information: [name]  [name] [address] [phone]           Tests Needing Follow-up: -CTA in 4-5 weeks at [facility] to re-evaluate aortic dissection -close monitoring of BP -monitoring of plt count w/ potential further tapering of steroids  -consider when anticoagulation can be safely resumed for his Afib   Discharge Diagnoses: Type B aortic dissection  HTN Thrombocytopenia  Chronic Afib on Xarelto RA  Initial presentation: 77 year old male remote smoker (quit in his 73s) w/ a hx of HTN and atrial fibrillation followed by Dr. Cleatrice Burke on [place] who developed thrombocytopenia and was evaluated by a Hematologist who diagnosed him w/ ITP, and treated him with steroids.    He developed diarrhea, abdominal pain, and back pain 2 weeks prior to this admit. Diarrhea ultimately cleared, but the abdominal and back pain continued. He sought medical treatment at [facility] on 08/17/2017 and was found to have an aortic aneurysm with possible dissection. He was transported to [facility] to the [facility] service with CVTS surgery to evaluate for possible repair of aortic aneurysm.  Hospital Course: Type B aortic dissection  TCTS recommends medical tx - mild to moderate aneurysmal enlargement of the entire descending thoracic  aorta with an acute intramural hematoma beginning at the level of the left subclavian artery and extending all the way to the bifurcation of the infrarenal aorta - no further intpt eval or tx indicated at this time   HTN BP currently well controlled - transitioned to long acting CCB in anticipation of d/c home    Thrombocytopenia  Plt reached a nadir of 45 - likely a combination of ITP + consumption w/ large intramural aortic hematoma - pt was previously being tx as outpt w/ steroids for ITP on a very slow taper - resume prior steroid dose and follow - has f/u w/ his Hematologist already scheduled on 08/24/17  Chronic Afib on Xarelto TCTS recommended no further anticoag until the pt is seen in follow up as an outpt and imaging confirms stability of the aorta   RA no acute joint flares at present - currently on prednisone (for ITP)   Allergies as of 08/22/2017   No Known Allergies     Medication List    STOP taking these medications   hydrochlorothiazide 25 MG tablet Commonly known as:  HYDRODIURIL   metoprolol tartrate 50 MG tablet Commonly known as:  LOPRESSOR   potassium chloride SA 20 MEQ tablet Commonly known as:  K-DUR,KLOR-CON   simvastatin 40 MG tablet Commonly known as:  ZOCOR   XARELTO 20 MG Tabs tablet Generic drug:  rivaroxaban     TAKE these medications   acetaminophen 325 MG tablet Commonly known as:  TYLENOL Take 325-650 mg by mouth every 6 (six) hours as needed for mild pain.   CALCIUM PO Take 1,200  mg by mouth 2 (two) times daily.   cetirizine 10 MG tablet Commonly known as:  ZYRTEC Take 10 mg by mouth daily.   CO Q 10 PO Take 1 tablet by mouth 2 (two) times daily.   diltiazem 120 MG 24 hr capsule Commonly known as:  CARDIZEM CD Take 1 capsule (120 mg total) by mouth daily. Start taking on:  08/23/2017   labetalol 200 MG tablet Commonly known as:  NORMODYNE Take 1 tablet (200 mg total) by mouth 2 (two) times daily.   magnesium gluconate  500 MG tablet Commonly known as:  MAGONATE Take 500 mg by mouth daily.   omeprazole 40 MG capsule Commonly known as:  PRILOSEC Take 40 mg by mouth daily.   polyvinyl alcohol 1.4 % ophthalmic solution Commonly known as:  LIQUIFILM TEARS Place 1 drop into both eyes as needed for dry eyes.   predniSONE 20 MG tablet Commonly known as:  DELTASONE Take 20 mg by mouth 2 (two) times daily with a meal.   rosuvastatin 20 MG tablet Commonly known as:  CRESTOR Take 20 mg by mouth daily.   Vitamin D3 2000 units Tabs Take 1,000 mg by mouth daily.       Day of Discharge BP 116/82 (BP Location: Right Arm)   Pulse 95   Temp 98.2 F (36.8 C) (Oral)   Resp 20   Ht 6' (1.829 m)   Wt 86.1 kg (189 lb 12.8 oz)   SpO2 95%   BMI 25.74 kg/m   Physical Exam: General: No acute respiratory distress Lungs: Clear to auscultation bilaterally without wheezes or crackles Cardiovascular: Regular rate and rhythm without murmur gallop or rub normal S1 and S2 Abdomen: Nontender, nondistended, soft, bowel sounds positive, no rebound, no ascites, no appreciable mass Extremities: No significant cyanosis, clubbing, or edema bilateral lower extremities  Basic Metabolic Panel: Recent Labs  Lab 08/17/17 1349 08/18/17 0333 08/19/17 1014 08/21/17 0501 08/22/17 0441  NA 140 137 135 135 136  K 4.1 4.2 4.0 4.0 3.8  CL 102 103 103 100* 103  CO2 24 23 21* 27 25  GLUCOSE 103* 109* 118* 92 102*  BUN 22* 25* 25* 30* 26*  CREATININE 1.10 1.08 1.15 1.22 1.04  CALCIUM 8.9 8.4* 8.6* 8.3* 8.4*  MG 1.8 1.8  --   --   --   PHOS 3.6 4.4  --   --   --     Liver Function Tests: Recent Labs  Lab 08/17/17 1349 08/21/17 0501  AST 47* 20  ALT 27 20  ALKPHOS 27* 28*  BILITOT 1.6* 2.2*  PROT 6.3* 5.3*  ALBUMIN 3.3* 2.1*   Recent Labs  Lab 08/17/17 1349  LIPASE 24  AMYLASE 45   Coags: Recent Labs  Lab 08/17/17 1349  INR 1.02   CBC: Recent Labs  Lab 08/17/17 1349 08/18/17 0333 08/19/17 1014  08/21/17 0501 08/22/17 0441  WBC 24.2* 15.9* 18.1* 11.9* 8.5  NEUTROABS  --   --  15.8*  --   --   HGB 17.4* 15.1 14.9 12.4* 11.8*  HCT 51.9 45.1 45.4 37.7* 36.3*  MCV 89.8 90.2 90.8 90.2 90.8  PLT 91* 65* 48* 48* 64*    Cardiac Enzymes: Recent Labs  Lab 08/17/17 1349 08/17/17 2128 08/18/17 0333  TROPONINI <0.03 <0.03 <0.03    Recent Results (from the past 240 hour(s))  Surgical pcr screen     Status: None   Collection Time: 08/17/17 11:17 AM  Result Value Ref Range Status  MRSA, PCR NEGATIVE NEGATIVE Final   Staphylococcus aureus NEGATIVE NEGATIVE Final    Comment: (NOTE) The Xpert SA Assay (FDA approved for NASAL specimens in patients 60 years of age and older), is one component of a comprehensive surveillance program. It is not intended to diagnose infection nor to guide or monitor treatment. Performed at [facility], [place] [address], [place], [place] [address]   Urine culture     Status: Abnormal   Collection Time: 08/17/17 12:09 PM  Result Value Ref Range Status   Specimen Description URINE, CLEAN CATCH  Final   Special Requests NONE  Final   Culture (A)  Final    <10,000 COLONIES/mL INSIGNIFICANT GROWTH Performed at [facility], [place] [address], [place], [place] [address]    Report Status 08/18/2017 FINAL  Final  Culture, blood (routine x 2)     Status: None   Collection Time: 08/17/17  1:40 PM  Result Value Ref Range Status   Specimen Description BLOOD LEFT ANTECUBITAL  Final   Special Requests   Final    BOTTLES DRAWN AEROBIC AND ANAEROBIC Blood Culture results may not be optimal due to an inadequate volume of blood received in culture bottles   Culture   Final    NO GROWTH 5 DAYS Performed at [facility], [place] [address] [place], [place] [address]    Report Status 08/22/2017 FINAL  Final  Culture, blood (routine x 2)     Status: None   Collection Time: 08/17/17  1:52 PM  Result Value Ref Range Status   Specimen Description BLOOD  LEFT HAND  Final   Special Requests   Final    BOTTLES DRAWN AEROBIC AND ANAEROBIC Blood Culture adequate volume   Culture   Final    NO GROWTH 5 DAYS Performed at [facility], [place] [address], [place],  [address]    Report Status 08/22/2017 FINAL  Final     Time spent in discharge (includes decision making & examination of pt): 35 minutes  08/22/2017, 3:35 PM   Cherene Altes, MD [facility] Office  [phone] Pager [phone]  On-Call/Text Page:      [email or web address]      password [facility]

## 2017-08-24 ENCOUNTER — Other Ambulatory Visit: Payer: Self-pay

## 2017-08-24 DIAGNOSIS — I7102 Dissection of abdominal aorta: Secondary | ICD-10-CM

## 2017-09-06 ENCOUNTER — Telehealth: Payer: Self-pay | Admitting: Vascular Surgery

## 2017-09-06 NOTE — Telephone Encounter (Signed)
-----   Message from Mena Goes, RN sent at 08/22/2017  8:43 AM EST ----- Regarding: 4-5 weeks with CTA   ----- Message ----- From: Iline Oven Sent: 08/22/2017   7:33 AM To: [facility] Charge Pool  Can you schedule a CTA chest/abd/pelvis with dissection protocol for this patient and to see Dr. Donzetta Matters in 4-5 weeks. Thanks, [facility]

## 2017-09-06 NOTE — Telephone Encounter (Signed)
Sched appts 09/28/17; CTA at 2:30 at [facility] and MD at 4:00. Lm on hm# to inform them of appts and cta instructions.

## 2017-09-07 DIAGNOSIS — D693 Immune thrombocytopenic purpura: Secondary | ICD-10-CM | POA: Diagnosis not present

## 2017-09-14 ENCOUNTER — Other Ambulatory Visit: Payer: Self-pay

## 2017-09-28 ENCOUNTER — Ambulatory Visit
Admission: RE | Admit: 2017-09-28 | Discharge: 2017-09-28 | Disposition: A | Discharge status: Home / Self Care | Payer: Medicare PPO | Source: Ambulatory Visit | Attending: Vascular Surgery | Admitting: Vascular Surgery

## 2017-09-28 ENCOUNTER — Ambulatory Visit (INDEPENDENT_AMBULATORY_CARE_PROVIDER_SITE_OTHER): Payer: Medicare PPO | Admitting: Vascular Surgery

## 2017-09-28 ENCOUNTER — Other Ambulatory Visit: Payer: Self-pay

## 2017-09-28 ENCOUNTER — Encounter: Payer: Self-pay | Admitting: Vascular Surgery

## 2017-09-28 VITALS — BP 128/83 | HR 91 | Resp 20 | Ht 72.0 in | Wt 196.0 lb

## 2017-09-28 DIAGNOSIS — I7102 Dissection of abdominal aorta: Secondary | ICD-10-CM

## 2017-09-28 MED ORDER — IOPAMIDOL (ISOVUE-370) INJECTION 76%
75.0000 mL | Freq: Once | INTRAVENOUS | Status: AC | PRN
  Administered 2017-09-28: 75 mL via INTRAVENOUS

## 2017-09-28 NOTE — Progress Notes (Signed)
Patient ID: Jerome Sullivan, male   DOB: 1940-12-30, 77 y.o.   MRN: [id number]  Reason for Consult: Follow-up (4-5 wk f/u )   Referred by Cyndy Freeze, MD  Subjective:     HPI:  Jerome Sullivan is a 77 y.o. male leg with recent hospital admission with hypertension and acute aortic syndrome with IMH extending from descending thoracic aorta to the bilateral common iliac arteries.  His pain was well controlled in the hospital but he did have thrombocytopenia.  He is now following with his hematologist and his pain is better controlled.  His only complaint is feeling fatigued with his blood pressure being under better control although he does have some energy with taking his current dose of steroids.  He is gained weight with the steroids.  He does not have any chest abdominal or back pain.  Past Medical History:  Diagnosis Date  . A-fib [facility])    failed cardioversion  . Aortic dissection distal to left subclavian ([place]) 08/17/2017  . HTN (hypertension)   . Hypercholesteremia   . Intramural hematoma of descending thoracic and abdominal aorta 08/17/2017   Family History  Problem Relation Age of Onset  . Heart disease Mother   . Heart disease Father   . Hypertension Sister    Past Surgical History:  Procedure Laterality Date  . BACK SURGERY    . HAND SURGERY Right     Short Social History:  Social History   Tobacco Use  . Smoking status: Former Smoker    Types: Cigarettes    Last attempt to quit: 06/26/1978    Years since quitting: 39.2  . Smokeless tobacco: Never Used  Substance Use Topics  . Alcohol use: No    No Known Allergies  Current Outpatient Medications  Medication Sig Dispense Refill  . acetaminophen (TYLENOL) 325 MG tablet Take 325-650 mg by mouth every 6 (six) hours as needed for mild pain.    Marland Kitchen CALCIUM PO Take 1,200 mg by mouth 2 (two) times daily.      . cetirizine (ZYRTEC) 10 MG tablet Take 10 mg by mouth daily.    . Cholecalciferol (VITAMIN D3) 2000 UNITS TABS  Take 1,000 mg by mouth daily.     . Coenzyme Q10 (CO Q 10 PO) Take 1 tablet by mouth 2 (two) times daily.     Marland Kitchen diltiazem (CARDIZEM CD) 120 MG 24 hr capsule Take 1 capsule (120 mg total) by mouth daily. 30 capsule 1  . labetalol (NORMODYNE) 200 MG tablet Take 1 tablet (200 mg total) by mouth 2 (two) times daily. 60 tablet 1  . magnesium gluconate (MAGONATE) 500 MG tablet Take 500 mg by mouth daily.    Marland Kitchen omeprazole (PRILOSEC) 40 MG capsule Take 40 mg by mouth daily.    . polyvinyl alcohol (LIQUIFILM TEARS) 1.4 % ophthalmic solution Place 1 drop into both eyes as needed for dry eyes.    . predniSONE (DELTASONE) 20 MG tablet Take 20 mg by mouth 2 (two) times daily with a meal. Take 2 1/2 tablets daily    . rosuvastatin (CRESTOR) 20 MG tablet Take 20 mg by mouth daily.     No current facility-administered medications for this visit.     Review of Systems  Constitutional:  Constitutional negative. HENT: HENT negative.  Eyes: Eyes negative.  Respiratory: Respiratory negative.  Cardiovascular: Cardiovascular negative.  GI: Gastrointestinal negative.  Musculoskeletal: Musculoskeletal negative.  Skin: Skin negative.  Neurological: Neurological negative. Hematologic: Hematologic/lymphatic negative.  Psychiatric:  Psychiatric negative.        Objective:  Objective   Vitals:   09/28/17 1619  BP: 128/83  Pulse: 91  Resp: 20  SpO2: 97%  Weight: 196 lb (88.9 kg)  Height: 6' (1.829 m)   Body mass index is 26.58 kg/m.  Physical Exam  Constitutional: He is oriented to person, place, and time. He appears well-developed.  HENT:  Head: Normocephalic.  Eyes: Pupils are equal, round, and reactive to light.  Neck: Normal range of motion.  Cardiovascular: Normal rate.  Pulses:      Radial pulses are 2+ on the right side, and 2+ on the left side.       Femoral pulses are 2+ on the right side, and 2+ on the left side.      Popliteal pulses are 2+ on the right side, and 2+ on the left side.    Pulmonary/Chest: Effort normal.  Abdominal: Soft. He exhibits no mass.  Musculoskeletal: He exhibits no edema.  Neurological: He is alert and oriented to person, place, and time.  Skin: Skin is warm and dry.  Psychiatric: He has a normal mood and affect. His behavior is normal. Judgment and thought content normal.    Data: Official CT angios read is pending.  I reviewed the CT scan with patient and his wife which demonstrates good early remodeling of the aorta.     Assessment/Plan:     77 year old male with history of recent type B IMH that was managed nonoperatively with blood pressure management.  He is now had better control and he is on steroids and his platelets are followed by hematology.  Final read of CT angios pending and we will follow that up.  If there are no concerns on that we will get him to follow-up in 6 months with repeat scan.  We discussed if he has new back abdominal or chest pain he needs urgent evaluation he demonstrates good understanding.     Waynetta Sandy MD [facility]

## 2017-10-04 DIAGNOSIS — D693 Immune thrombocytopenic purpura: Secondary | ICD-10-CM | POA: Diagnosis not present

## 2017-10-08 ENCOUNTER — Encounter: Payer: Self-pay | Admitting: Cardiovascular Disease

## 2017-10-08 ENCOUNTER — Ambulatory Visit: Payer: Medicare PPO | Admitting: Cardiovascular Disease

## 2017-10-08 VITALS — BP 102/58 | HR 103 | Ht 72.0 in | Wt 199.2 lb

## 2017-10-08 DIAGNOSIS — I71019 Dissection of thoracic aorta, unspecified: Secondary | ICD-10-CM

## 2017-10-08 DIAGNOSIS — I482 Chronic atrial fibrillation: Secondary | ICD-10-CM | POA: Diagnosis not present

## 2017-10-08 DIAGNOSIS — I10 Essential (primary) hypertension: Secondary | ICD-10-CM | POA: Diagnosis not present

## 2017-10-08 DIAGNOSIS — I7101 Dissection of thoracic aorta: Secondary | ICD-10-CM | POA: Diagnosis not present

## 2017-10-08 DIAGNOSIS — I4821 Permanent atrial fibrillation: Secondary | ICD-10-CM

## 2017-10-08 NOTE — Progress Notes (Signed)
Cardiology Office Note   Date:  10/08/2017   ID:  Jerome Sullivan, DOB 12-13-1940, MRN [id number]  PCP:  Cyndy Freeze, MD  Cardiologist:   Mertie Moores, MD   Chief Complaint  Patient presents with  . Atrial Fibrillation   Problem list: 1. Atrial fibrillation 2. Hypertension 3. Hyperlipidemia  notes prior to 2014:  Jerome Sullivan is doing well. We tried Metoprolol 50 BID but this caused significant dyspnea. He reduced his dose back down to 25 BID and now feels well.  September 24, 2012: Jerome Sullivan is doing well. No problems. He has some dyspne . He works in the garden. - does not walk regularly. He is still eating salt / sea salt.  Nov. 21, 2014: Jerome Sullivan is doing ok. No CP , no dypsnea. Not walking regularly. Trying to avoid some salt. Has GERD symptoms - especially when he lies down.   Nov. 5, 2015: Jerome Sullivan is doing ok. Still eating some salt  Feb. 9, 2016: Jerome Sullivan is a 77 y.o. male who presents for follow up of his Afib and HTN [name] is doing ok.  Went rabbit hunting 2 days ago.  No CP or dypsnea with walking.  Just some leg cramping afterwards.   Sept. 6, 2016:  Doing well.  Has some dyspnea.  No CP    August 31, 2015:  Has some DOE climbing stairs.  Has some right foot pain - has plantar fascitis.   September 04, 2016:  Jerome Sullivan is seen today for follow-up of his atrial fibrillation. He also has a history of hypertension and hyperlipidemia. Has some shortness of breath .   Worse this year.  Still does rabbit hunting   October 08, 2017   Jerome Sullivan is seen today for follow-up of his hypertension and atrial fibrillation and a recent type B aortic dissection .  Was admitted in Feb with aortic dissection - type B  BP is now well controlled   Echocardiogram performed February, 2019 reveals normal left ventricular systolic function with an ejection fraction of 65-70%.  We were not able to evaluate diastolic function because of his atrial fibrillation. Saw Dr. Donzetta Matters recently - aorta  looks great .  HR has been elevated    Past Medical History:  Diagnosis Date  . A-fib [facility])    failed cardioversion  . Aortic dissection distal to left subclavian ([place]) 08/17/2017  . HTN (hypertension)   . Hypercholesteremia   . Intramural hematoma of descending thoracic and abdominal aorta 08/17/2017    Past Surgical History:  Procedure Laterality Date  . BACK SURGERY    . HAND SURGERY Right      Current Outpatient Medications  Medication Sig Dispense Refill  . acetaminophen (TYLENOL) 325 MG tablet Take 325-650 mg by mouth every 6 (six) hours as needed for mild pain.    Marland Kitchen CALCIUM PO Take 1,000 mg by mouth daily.     . cetirizine (ZYRTEC) 10 MG tablet Take 10 mg by mouth daily.    . Cholecalciferol (VITAMIN D3) 2000 UNITS TABS Take 1,000 mg by mouth daily.     . Coenzyme Q10 (CO Q 10 PO) Take 1 tablet by mouth 2 (two) times daily.     Marland Kitchen diltiazem (CARDIZEM CD) 120 MG 24 hr capsule Take 1 capsule (120 mg total) by mouth daily. 30 capsule 1  . labetalol (NORMODYNE) 200 MG tablet Take 1 tablet (200 mg total) by mouth 2 (two) times daily. 60 tablet 1  . magnesium gluconate (MAGONATE) 500  MG tablet Take 500 mg by mouth daily.    Marland Kitchen omeprazole (PRILOSEC) 40 MG capsule Take 40 mg by mouth daily.    . polyvinyl alcohol (LIQUIFILM TEARS) 1.4 % ophthalmic solution Place 1 drop into both eyes as needed for dry eyes.    . predniSONE (DELTASONE) 20 MG tablet Take 20 mg by mouth 2 (two) times daily with a meal. Take 2 1/2 tablets daily    . rosuvastatin (CRESTOR) 20 MG tablet Take 20 mg by mouth daily.     No current facility-administered medications for this visit.     Allergies:   Patient has no known allergies.    Social History:  The patient  reports that he quit smoking about 39 years ago. His smoking use included cigarettes. He has never used smokeless tobacco. He reports that he does not drink alcohol or use drugs.   Family History:  The patient's family history includes Heart  disease in his father and mother; Hypertension in his sister.    ROS: Noted in current history, all other review of systems are negative.  Physical Exam: Blood pressure (!) 102/58, pulse (!) 103, height 6' (1.829 m), weight 199 lb 3.2 oz (90.4 kg), SpO2 97 %.  GEN:  Well nourished, well developed in no acute distress HEENT: Normal NECK: No JVD; No carotid bruits LYMPHATICS: No lymphadenopathy CARDIAC: Irreg. Irreg.  RESPIRATORY:  Clear to auscultation without rales, wheezing or rhonchi  ABDOMEN: Soft, non-tender, non-distended MUSCULOSKELETAL:  No edema; No deformity  SKIN: Warm and dry NEUROLOGIC:  Alert and oriented x 3    EKG:   08/14/17  - atrial fib with controlled V response.   Recent Labs: 08/18/2017: Magnesium 1.8; TSH 1.755 08/21/2017: ALT 20 08/22/2017: BUN 26; Creatinine, Ser 1.04; Hemoglobin 11.8; Platelets 64; Potassium 3.8; Sodium 136    Lipid Panel    Component Value Date/Time   CHOL 173 08/18/2017 0333   CHOL 145 09/04/2016 0954   TRIG 162 (H) 08/18/2017 0333   HDL 34 (L) 08/18/2017 0333   HDL 33 (L) 09/04/2016 0954   CHOLHDL 5.1 08/18/2017 0333   VLDL 32 08/18/2017 0333   LDLCALC 107 (H) 08/18/2017 0333   LDLCALC 77 09/04/2016 0954      Wt Readings from Last 3 Encounters:  10/08/17 199 lb 3.2 oz (90.4 kg)  09/28/17 196 lb (88.9 kg)  08/22/17 189 lb 12.8 oz (86.1 kg)      Other studies Reviewed: Additional studies/ records that were reviewed today include: . Review of the above records demonstrates:    ASSESSMENT AND PLAN:  Problem list: 1. Atrial fibrillation-  HR has been fairly well controlled   2..  Aortic dissection:   Type B dissection. Doing well with medical therapy .   2. Hypertension-     .well controlled.  Continue meds.   3. Hyperlipidemia-    Encouraged him to start a walking program.   Current medicines are reviewed at length with the patient today.  The patient does not have concerns regarding medicines.  The following  changes have been made:  no change   Disposition:   FU with me in 1 year.    Signed, Mertie Moores, MD  10/08/2017 10:01 AM    [facility] [address], [place], [place]  [address] Phone: [phone]; Fax: [phone]

## 2017-10-08 NOTE — Patient Instructions (Signed)
Medication Instructions:  Your physician recommends that you continue on your current medications as directed. Please refer to the Current Medication list given to you today.   Labwork: None Ordered   Testing/Procedures: None Ordered   Follow-Up: Your physician wants you to follow-up in: 3 months with PA or Nurse Practitioner on Dr. Elmarie Shiley team. You will receive a reminder letter in the mail two months in advance. If you don't receive a letter, please call our office to schedule the follow-up appointment.   If you need a refill on your cardiac medications before your next appointment, please call your pharmacy.   Thank you for choosing [facility]! Christen Bame, RN [phone]

## 2017-11-02 DIAGNOSIS — D693 Immune thrombocytopenic purpura: Secondary | ICD-10-CM | POA: Diagnosis not present

## 2017-11-30 DIAGNOSIS — I2609 Other pulmonary embolism with acute cor pulmonale: Secondary | ICD-10-CM | POA: Diagnosis not present

## 2017-11-30 DIAGNOSIS — R7989 Other specified abnormal findings of blood chemistry: Secondary | ICD-10-CM | POA: Diagnosis not present

## 2017-11-30 DIAGNOSIS — I712 Thoracic aortic aneurysm, without rupture: Secondary | ICD-10-CM

## 2017-11-30 DIAGNOSIS — D649 Anemia, unspecified: Secondary | ICD-10-CM | POA: Diagnosis not present

## 2017-11-30 DIAGNOSIS — I480 Paroxysmal atrial fibrillation: Secondary | ICD-10-CM | POA: Diagnosis not present

## 2017-11-30 DIAGNOSIS — D693 Immune thrombocytopenic purpura: Secondary | ICD-10-CM

## 2017-11-30 DIAGNOSIS — J90 Pleural effusion, not elsewhere classified: Secondary | ICD-10-CM

## 2017-11-30 DIAGNOSIS — Z8679 Personal history of other diseases of the circulatory system: Secondary | ICD-10-CM | POA: Diagnosis not present

## 2017-11-30 DIAGNOSIS — I10 Essential (primary) hypertension: Secondary | ICD-10-CM | POA: Diagnosis not present

## 2017-11-30 DIAGNOSIS — E782 Mixed hyperlipidemia: Secondary | ICD-10-CM | POA: Diagnosis not present

## 2017-12-01 DIAGNOSIS — I4891 Unspecified atrial fibrillation: Secondary | ICD-10-CM

## 2017-12-01 DIAGNOSIS — J90 Pleural effusion, not elsewhere classified: Secondary | ICD-10-CM | POA: Diagnosis not present

## 2017-12-01 DIAGNOSIS — R0602 Shortness of breath: Secondary | ICD-10-CM

## 2017-12-01 DIAGNOSIS — R7989 Other specified abnormal findings of blood chemistry: Secondary | ICD-10-CM | POA: Diagnosis not present

## 2017-12-01 DIAGNOSIS — Z862 Personal history of diseases of the blood and blood-forming organs and certain disorders involving the immune mechanism: Secondary | ICD-10-CM | POA: Diagnosis not present

## 2017-12-01 DIAGNOSIS — Z8679 Personal history of other diseases of the circulatory system: Secondary | ICD-10-CM | POA: Diagnosis not present

## 2017-12-01 DIAGNOSIS — I2609 Other pulmonary embolism with acute cor pulmonale: Secondary | ICD-10-CM | POA: Diagnosis not present

## 2017-12-01 DIAGNOSIS — D649 Anemia, unspecified: Secondary | ICD-10-CM | POA: Diagnosis not present

## 2017-12-01 DIAGNOSIS — I10 Essential (primary) hypertension: Secondary | ICD-10-CM | POA: Diagnosis not present

## 2017-12-01 DIAGNOSIS — I82402 Acute embolism and thrombosis of unspecified deep veins of left lower extremity: Secondary | ICD-10-CM | POA: Diagnosis not present

## 2017-12-01 DIAGNOSIS — I2699 Other pulmonary embolism without acute cor pulmonale: Secondary | ICD-10-CM | POA: Diagnosis not present

## 2017-12-01 DIAGNOSIS — I480 Paroxysmal atrial fibrillation: Secondary | ICD-10-CM | POA: Diagnosis not present

## 2017-12-01 DIAGNOSIS — E782 Mixed hyperlipidemia: Secondary | ICD-10-CM | POA: Diagnosis not present

## 2017-12-01 DIAGNOSIS — D693 Immune thrombocytopenic purpura: Secondary | ICD-10-CM

## 2017-12-01 DIAGNOSIS — I712 Thoracic aortic aneurysm, without rupture: Secondary | ICD-10-CM | POA: Diagnosis not present

## 2017-12-05 DIAGNOSIS — I82439 Acute embolism and thrombosis of unspecified popliteal vein: Secondary | ICD-10-CM | POA: Insufficient documentation

## 2017-12-05 DIAGNOSIS — I2699 Other pulmonary embolism without acute cor pulmonale: Secondary | ICD-10-CM | POA: Insufficient documentation

## 2017-12-14 DIAGNOSIS — D649 Anemia, unspecified: Secondary | ICD-10-CM

## 2017-12-14 DIAGNOSIS — Z7901 Long term (current) use of anticoagulants: Secondary | ICD-10-CM

## 2017-12-14 DIAGNOSIS — I2699 Other pulmonary embolism without acute cor pulmonale: Secondary | ICD-10-CM

## 2017-12-14 DIAGNOSIS — Z92241 Personal history of systemic steroid therapy: Secondary | ICD-10-CM

## 2017-12-14 DIAGNOSIS — R531 Weakness: Secondary | ICD-10-CM

## 2017-12-14 DIAGNOSIS — D693 Immune thrombocytopenic purpura: Secondary | ICD-10-CM | POA: Diagnosis not present

## 2017-12-14 DIAGNOSIS — I82402 Acute embolism and thrombosis of unspecified deep veins of left lower extremity: Secondary | ICD-10-CM | POA: Diagnosis not present

## 2017-12-14 DIAGNOSIS — I4891 Unspecified atrial fibrillation: Secondary | ICD-10-CM

## 2018-02-01 DIAGNOSIS — D693 Immune thrombocytopenic purpura: Secondary | ICD-10-CM

## 2018-02-01 DIAGNOSIS — I4891 Unspecified atrial fibrillation: Secondary | ICD-10-CM

## 2018-02-01 DIAGNOSIS — D649 Anemia, unspecified: Secondary | ICD-10-CM

## 2018-02-01 DIAGNOSIS — I82402 Acute embolism and thrombosis of unspecified deep veins of left lower extremity: Secondary | ICD-10-CM | POA: Diagnosis not present

## 2018-02-01 DIAGNOSIS — Z7952 Long term (current) use of systemic steroids: Secondary | ICD-10-CM

## 2018-02-01 DIAGNOSIS — I2699 Other pulmonary embolism without acute cor pulmonale: Secondary | ICD-10-CM | POA: Diagnosis not present

## 2018-02-01 DIAGNOSIS — R531 Weakness: Secondary | ICD-10-CM

## 2018-02-01 DIAGNOSIS — Z7901 Long term (current) use of anticoagulants: Secondary | ICD-10-CM | POA: Diagnosis not present

## 2018-03-15 DIAGNOSIS — Z7901 Long term (current) use of anticoagulants: Secondary | ICD-10-CM

## 2018-03-15 DIAGNOSIS — I2699 Other pulmonary embolism without acute cor pulmonale: Secondary | ICD-10-CM

## 2018-03-15 DIAGNOSIS — I4891 Unspecified atrial fibrillation: Secondary | ICD-10-CM

## 2018-03-15 DIAGNOSIS — D693 Immune thrombocytopenic purpura: Secondary | ICD-10-CM | POA: Diagnosis not present

## 2018-03-15 DIAGNOSIS — I82402 Acute embolism and thrombosis of unspecified deep veins of left lower extremity: Secondary | ICD-10-CM | POA: Diagnosis not present

## 2018-03-15 DIAGNOSIS — E46 Unspecified protein-calorie malnutrition: Secondary | ICD-10-CM

## 2018-03-15 DIAGNOSIS — D649 Anemia, unspecified: Secondary | ICD-10-CM

## 2018-04-05 ENCOUNTER — Ambulatory Visit: Payer: Medicare PPO | Admitting: Vascular Surgery

## 2018-04-05 ENCOUNTER — Other Ambulatory Visit: Payer: Self-pay

## 2018-04-05 ENCOUNTER — Encounter: Payer: Self-pay | Admitting: Vascular Surgery

## 2018-04-05 VITALS — BP 119/74 | HR 79 | Resp 20 | Ht 72.0 in | Wt 201.0 lb

## 2018-04-05 DIAGNOSIS — I7102 Dissection of abdominal aorta: Secondary | ICD-10-CM

## 2018-04-05 DIAGNOSIS — M7121 Synovial cyst of popliteal space [Baker], right knee: Secondary | ICD-10-CM | POA: Insufficient documentation

## 2018-04-05 NOTE — Progress Notes (Signed)
Patient ID: Jerome Sullivan, male   DOB: 12/10/1940, 77 y.o.   MRN: [id number]  Reason for Consult: Follow-up (6 month f/u )   Referred by Cyndy Freeze, MD  Subjective:     HPI:  Jerome Sullivan is a 77 y.o. male here for six-month follow-up from recent admission with acute aortic syndrome.  At that time he was diagnosed with ITP.  He is now being followed by hematology.  He has not had any further chest pain.  Most of his complaints of been with his arthritis.  He did have a CT scan to evaluate him for PE which include his chest and abdomen in June.  He does not have a dedicated CT scan for today.  Overall he is doing well from a vascular standpoint.  Past Medical History:  Diagnosis Date  . A-fib [facility])    failed cardioversion  . Aortic dissection distal to left subclavian ([place]) 08/17/2017  . HTN (hypertension)   . Hypercholesteremia   . Intramural hematoma of descending thoracic and abdominal aorta 08/17/2017   Family History  Problem Relation Age of Onset  . Heart disease Mother   . Heart disease Father   . Hypertension Sister    Past Surgical History:  Procedure Laterality Date  . BACK SURGERY    . HAND SURGERY Right     Short Social History:  Social History   Tobacco Use  . Smoking status: Former Smoker    Types: Cigarettes    Last attempt to quit: 06/26/1978    Years since quitting: 39.8  . Smokeless tobacco: Never Used  Substance Use Topics  . Alcohol use: No    No Known Allergies  Current Outpatient Medications  Medication Sig Dispense Refill  . acetaminophen (TYLENOL) 325 MG tablet Take 325-650 mg by mouth every 6 (six) hours as needed for mild pain.    Marland Kitchen CALCIUM PO Take 1,000 mg by mouth daily.     . cetirizine (ZYRTEC) 10 MG tablet Take 10 mg by mouth daily.    . Cholecalciferol (VITAMIN D3) 2000 UNITS TABS Take 1,000 mg by mouth daily.     . Coenzyme Q10 (CO Q 10 PO) Take 1 tablet by mouth 2 (two) times daily.     Marland Kitchen diltiazem (CARDIZEM CD) 120 MG 24 hr  capsule Take 1 capsule (120 mg total) by mouth daily. 30 capsule 1  . labetalol (NORMODYNE) 200 MG tablet Take 1 tablet (200 mg total) by mouth 2 (two) times daily. 60 tablet 1  . montelukast (SINGULAIR) 10 MG tablet Take 10 mg by mouth at bedtime.    . Multiple Minerals (CALCIUM/MAGNESIUM/ZINC PO) Take by mouth.    . Multiple Vitamins-Minerals (CENTRUM SILVER PO) Take by mouth.    Marland Kitchen omeprazole (PRILOSEC) 40 MG capsule Take 40 mg by mouth daily.    . predniSONE (DELTASONE) 20 MG tablet Take 20 mg by mouth 2 (two) times daily with a meal. Take 2 1/2 tablets daily    . rosuvastatin (CRESTOR) 20 MG tablet Take 20 mg by mouth daily.    . magnesium gluconate (MAGONATE) 500 MG tablet Take 500 mg by mouth daily.    . polyvinyl alcohol (LIQUIFILM TEARS) 1.4 % ophthalmic solution Place 1 drop into both eyes as needed for dry eyes.    Alveda Reasons 20 MG TABS tablet      No current facility-administered medications for this visit.     Review of Systems  Constitutional:  Constitutional negative. HENT: HENT  negative.  Eyes: Eyes negative.  Respiratory: Respiratory negative.  Cardiovascular: Positive for leg swelling.  Musculoskeletal: Positive for joint pain.  Skin: Skin negative.  Neurological: Neurological negative. Hematologic: Hematologic/lymphatic negative.  Psychiatric: Psychiatric negative.        Objective:  Objective   Vitals:   04/05/18 0952  BP: 119/74  Pulse: 79  Resp: 20  SpO2: 96%  Weight: 201 lb (91.2 kg)  Height: 6' (1.829 m)   Body mass index is 27.26 kg/m.  Physical Exam  Constitutional: He is oriented to person, place, and time. He appears well-developed.  HENT:  Head: Normocephalic.  Eyes: Pupils are equal, round, and reactive to light.  Neck: Normal range of motion. Neck supple.  Cardiovascular: Normal rate.  Pulses:      Carotid pulses are 2+ on the right side, and 2+ on the left side.      Radial pulses are 2+ on the right side, and 2+ on the left side.        Femoral pulses are 2+ on the right side, and 2+ on the left side.      Popliteal pulses are 2+ on the right side, and 2+ on the left side.  Pulmonary/Chest: Effort normal.  Abdominal: Soft. He exhibits no mass.  Musculoskeletal: Normal range of motion. He exhibits no edema.  Neurological: He is alert and oriented to person, place, and time.  Skin: Skin is warm and dry.    Data: We reviewed his CT scans together including from February, April and most recent June study which demonstrated pulmonary emboli on the CT Angie of his chest.  The abdomen and pelvis demonstrates persistent thrombus that is laminated throughout the distal thoracic proximal abdominal aorta without any flow limitation and significant improvement from his previous intermural hematoma.  The distal thoracic aorta measures 4.1 cm.     Assessment/Plan:     77 year old male previous hospitalization with acute aortic syndrome with intramural hematoma that was type B.  This has mostly resolved he has no further symptoms.  He does have dilatation of the aorta up to 4.1 cm so we will follow this in 1 year with CT Angie.  If he has a CT close to that one year time.  We can cancel the CT scan and review that as we did today.  He demonstrates good understanding if he has further issues he will need to be seen sooner.     Waynetta Sandy MD [facility]

## 2018-04-12 DIAGNOSIS — M7121 Synovial cyst of popliteal space [Baker], right knee: Secondary | ICD-10-CM

## 2018-04-12 DIAGNOSIS — Z7901 Long term (current) use of anticoagulants: Secondary | ICD-10-CM | POA: Diagnosis not present

## 2018-04-12 DIAGNOSIS — I4891 Unspecified atrial fibrillation: Secondary | ICD-10-CM

## 2018-04-12 DIAGNOSIS — Z86711 Personal history of pulmonary embolism: Secondary | ICD-10-CM

## 2018-04-12 DIAGNOSIS — Z862 Personal history of diseases of the blood and blood-forming organs and certain disorders involving the immune mechanism: Secondary | ICD-10-CM | POA: Diagnosis not present

## 2018-04-12 DIAGNOSIS — Z86718 Personal history of other venous thrombosis and embolism: Secondary | ICD-10-CM

## 2018-10-22 ENCOUNTER — Telehealth (INDEPENDENT_AMBULATORY_CARE_PROVIDER_SITE_OTHER): Payer: Medicare PPO | Admitting: Cardiology

## 2018-10-22 ENCOUNTER — Encounter: Payer: Self-pay | Admitting: Cardiology

## 2018-10-22 ENCOUNTER — Other Ambulatory Visit: Payer: Self-pay

## 2018-10-22 VITALS — BP 112/66 | HR 65 | Ht 72.0 in | Wt 197.0 lb

## 2018-10-22 DIAGNOSIS — I7101 Dissection of thoracic aorta: Secondary | ICD-10-CM

## 2018-10-22 DIAGNOSIS — I71019 Dissection of thoracic aorta, unspecified: Secondary | ICD-10-CM

## 2018-10-22 DIAGNOSIS — E7800 Pure hypercholesterolemia, unspecified: Secondary | ICD-10-CM

## 2018-10-22 DIAGNOSIS — I4821 Permanent atrial fibrillation: Secondary | ICD-10-CM

## 2018-10-22 DIAGNOSIS — I10 Essential (primary) hypertension: Secondary | ICD-10-CM

## 2018-10-22 NOTE — Patient Instructions (Addendum)
Medication Instructions:  Your physician recommends that you continue on your current medications as directed. Please refer to the Current Medication list given to you today.  If you need a refill on your cardiac medications before your next appointment, please call your pharmacy.   Lab work: None   If you have labs (blood work) drawn today and your tests are completely normal, you will receive your results only by: Marland Kitchen MyChart Message (if you have MyChart) OR . A paper copy in the mail If you have any lab test that is abnormal or we need to change your treatment, we will call you to review the results.  Testing/Procedures: None   Follow-Up: At [facility], you and your health needs are our priority.  As part of our continuing mission to provide you with exceptional heart care, we have created designated Provider Care Teams.  These Care Teams include your primary Cardiologist (physician) and Advanced Practice Providers (APPs -  Physician Assistants and Nurse Practitioners) who all work together to provide you with the care you need, when you need it. You will need a follow up appointment in:  9 months.  Please call our office 2 months in advance to schedule this appointment.  You may see Mertie Moores, MD or one of the following Advanced Practice Providers on your designated Care Team: Richardson Dopp, PA-C [place], [place] . Daune Perch, NP  Any Other Special Instructions Will Be Listed Below (If Applicable).  Lifestyle Modifications to Prevent and Treat Heart Disease -Recommend heart healthy/Mediterranean diet, with whole grains, fruits, vegetable, fish, lean meats, nuts, and olive oil.  -Limit salt intake. -Recommend moderate walking, 3-5 times/week for 30-50 minutes each session. Aim for at least 150 minutes.week. Goal should be pace of 3 miles/hours, or walking 1.5 miles in 30 minutes -Recommend avoidance of tobacco products. Avoid excess alcohol.     Aortic Dissection   Aortic dissection happens when there is a tear in the wall of the body's main blood vessel (aorta). The aorta leads out of the heart (ascending aorta), curves around, and then goes down the chest (descending aorta) and into the abdomen to supply arteries with blood. The wall of the aorta has inner and outer layers. As blood collects along the tear, one part of the aorta continues to carry blood to the body, but blood can also flow into the tear between the layers of the aorta. The torn part of the aorta fills with blood and swells. This can reduce blood flow through the part of the aorta that is still supplying blood to the body. Aortic dissection is a medical emergency. What are the causes? This condition is commonly caused by weakening of the artery wall due to high blood pressure. Other causes may include:  An injury, such as from a car crash.  A complication from heart surgery or from a diagnostic procedure called coronary catheterization.  Weakness of the artery wall due to birth defects that affect the connective tissues, such as Marfan syndrome. In some cases, the cause is not known. What increases the risk? The following factors may make you more likely to develop this condition:  Having certain medical conditions, such as: ? High blood pressure (hypertension). ? Hardening and narrowing of the arteries (atherosclerosis). ? A condition that causes inflammation of blood vessels, such as giant cell arteritis.  Having two cusps in the aortic valve instead of three (bicuspid aortic valve).  Having a bulge in the wall of the aorta (aortic aneurysm).  Being male.  Being pregnant.  Being older than age 75.  Using cocaine.  Smoking.  Lifting heavy weights or doing other types of strength training (high-intensity resistance training). What are the signs or symptoms? Signs and symptoms of aortic dissection start suddenly. The most common symptoms are:  Severe chest pain that may  feel like tearing, stabbing, or sharp pain.  Severe pain that spreads (radiates) to the back, neck, jaw, or abdomen.  Severe pain between the shoulder blades in the back. Other symptoms may include:  Trouble breathing.  Dizziness or fainting.  Sudden weakness on one side of the body.  Nausea or vomiting.  Trouble swallowing.  Coughing up blood.  Vomiting blood.  Clammy skin. How is this diagnosed? This condition may be diagnosed based on:  Your symptoms and a physical exam. This may include: ? Listening for abnormal blood flow sounds (murmurs) in your chest or abdomen. ? Checking your pulse in your arms and legs. ? Checking your blood pressure to see whether it is low, or whether there is a difference between the measurements (readings) from your right arm and left arm.  Electrocardiogram (ECG). This test measures the electrical activity in your heart.  Chest X-ray.  CT scan.  MRI.  Echocardiogram. This uses sound waves to make images of your heart.  Blood tests. How is this treated? It is important to treat aortic dissection as quickly as possible. Treatment may start as soon as your health care provider thinks that you have aortic dissection. Treatment depends on where the dissection is, how severe it is, and your overall health. Treatment may include:  Medicines to lower your heart rate and blood pressure.  Surgery to repair your aorta using artificial material (syntheticgraft).  A procedure to insert a stent-graft into the aorta (endovascular procedure). During this procedure: 1. A long, thin tube (stent) is inserted into an artery near the groin (femoral artery). 2. The stent is moved up to the damaged part of the aorta. 3. The stent is opened to help improve blood flow and prevent future dissection. Your health care provider may refer you to a specialized treatment center. Follow these instructions at home: If you had surgery, follow instructions from your  health care provider about home care after the procedure. Activity  Do not lift anything that is heavier than 10 lb (4.5 kg), or the limit that you are told, until your health care provider says that it is safe.  Avoid activities that could injure your chest or abdomen. Ask your health care provider what activities are safe for you.  After you have recovered, try to stay active. Ask your health care provider what activities are safe for you after recovery.  Enroll in cardiac rehabilitation. This is a program that helps to improve your health and well-being. It includes exercise training, education, and counseling to help you recover. Lifestyle      Eat a heart-healthy diet, which includes lots of fresh fruits and vegetables, low-fat (lean) protein, and whole grains.  Work with your health care provider to treat any other conditions that you may have, such as obesity, high blood pressure, or diabetes.  Do not use any products that contain nicotine or tobacco, such as cigarettes, e-cigarettes, and chewing tobacco. If you need help quitting, ask your health care provider. General instructions  Take over-the-counter and prescription medicines only as told by your health care provider.  Talk with your health care provider about how to manage stress.  Keep all  follow-up visits as told by your health care provider. This is important. Get help right away if you:  Develop any symptoms of aortic dissection after treatment, including severe pain in your chest, back, or abdomen.  Have pain in your chest.  Have weakness in your arm or leg.  Have pain in your abdomen.  Have trouble breathing or you develop a cough.  Faint.  Develop a racing heartbeat (palpitations). These symptoms may represent a serious problem that is an emergency. Do not wait to see if the symptoms will go away. Get medical help right away. Call your local emergency services (911 in the [place] Do not drive yourself to  the hospital. Summary  Aortic dissection happens when there is a tear in the wall of the body's main blood vessel (aorta). It is a medical emergency.  The most common symptom is severe pain in the chest or pain that spreads (radiates) to the back, neck, jaw, or abdomen.  It is important to treat aortic dissection as quickly as possible. Treatment usually includes medicines and surgery.  Take over-the-counter and prescription medicines only as told by your health care provider. This information is not intended to replace advice given to you by your health care provider. Make sure you discuss any questions you have with your health care provider. Document Released: 09/19/2007 Document Revised: 11/22/2017 Document Reviewed: 11/22/2017 Elsevier Interactive Patient Education  2019 [employer].

## 2018-10-22 NOTE — Progress Notes (Addendum)
Virtual Visit via Telephone Note   This visit type was conducted due to national recommendations for restrictions regarding the COVID-19 Pandemic (e.g. social distancing) in an effort to limit this patient's exposure and mitigate transmission in our community.  Due to his co-morbid illnesses, this patient is at least at moderate risk for complications without adequate follow up.  This format is felt to be most appropriate for this patient at this time.  The patient did not have access to video technology/had technical difficulties with video requiring transitioning to audio format only (telephone).  All issues noted in this document were discussed and addressed.  No physical exam could be performed with this format.  Please refer to the patient's chart for his  consent to telehealth for [facility].   Evaluation Performed:  Follow-up visit  Date:  10/22/2018   ID:  Jerome Sullivan, DOB 12-05-1940, MRN [id number]  Patient Location: Home Provider Location: Home  PCP:  Street, Sharon Mt, MD  Cardiologist:  Mertie Moores, MD  Electrophysiologist:  None   Chief Complaint:  Follow up of afib and aortic dissection  History of Present Illness:    Jerome Sullivan is a 78 y.o. male with past medical history significant for hypertension, permanent atrial fibrillation on Xarelto, hyperlipidemia, RA and Type B aortic dissection 07/2017. He is followed by [facility] for vascular care and follow of aortic dissection.   Echocardiogram performed February, 2019 revealed normal left ventricular systolic function with an ejection fraction of 65-70%.  We were not able to evaluate diastolic function because of his atrial fibrillation.   All available labs reviewed.  Labs via [facility]: SCr 0.86 07/19/18 Hgb 14.8  07/19/18 Plts  188 07/19/18  Jerome Sullivan is being seen today for annual follow up. He is having back pain related to arthritis. He has had 2 back surgeries. He is still working putting up house  siding "wrapping". He also walks 1.5 miles/30 minutes most mornings on treadmill. He has planted his garden for this year. Activity hurts his back but causes no chest pain/pressure or shortness of breath. He denies palpitations, lightheadedness or syncope. He denies orthopnea, PND or edema. He says he used to have foot swelling but its all gone.   The patient does not have symptoms concerning for COVID-19 infection (fever, chills, cough, or new shortness of breath).    Past Medical History:  Diagnosis Date  . A-fib [facility])    failed cardioversion  . Aortic dissection distal to left subclavian ([place]) 08/17/2017  . HTN (hypertension)   . Hypercholesteremia   . Intramural hematoma of descending thoracic and abdominal aorta 08/17/2017   Past Surgical History:  Procedure Laterality Date  . BACK SURGERY    . HAND SURGERY Right      Current Meds  Medication Sig  . acetaminophen (TYLENOL) 325 MG tablet Take 325-650 mg by mouth every 6 (six) hours as needed for mild pain.  Marland Kitchen CALCIUM PO Take 1,000 mg by mouth daily.   . cetirizine (ZYRTEC) 10 MG tablet Take 10 mg by mouth daily.  . Coenzyme Q10 (CO Q 10 PO) Take 1 tablet by mouth 2 (two) times daily.   Marland Kitchen diltiazem (CARDIZEM CD) 120 MG 24 hr capsule Take 1 capsule (120 mg total) by mouth daily.  Marland Kitchen labetalol (NORMODYNE) 200 MG tablet Take 1 tablet (200 mg total) by mouth 2 (two) times daily.  . magnesium gluconate (MAGONATE) 500 MG tablet Take 500 mg by mouth daily.  . methotrexate (  RHEUMATREX) 2.5 MG tablet Take 7.5 mg by mouth once a week. Pt takes 6 tablets once a week.  . montelukast (SINGULAIR) 10 MG tablet Take 10 mg by mouth at bedtime.  . Multiple Minerals (CALCIUM/MAGNESIUM/ZINC PO) Take by mouth.  . Multiple Vitamins-Minerals (CENTRUM SILVER PO) Take by mouth.  . polyvinyl alcohol (LIQUIFILM TEARS) 1.4 % ophthalmic solution Place 1 drop into both eyes as needed for dry eyes.  . rosuvastatin (CRESTOR) 20 MG tablet Take 20 mg by mouth daily.   Alveda Reasons 20 MG TABS tablet Take 20 mg by mouth daily.      Allergies:   Patient has no known allergies.   Social History   Tobacco Use  . Smoking status: Former Smoker    Types: Cigarettes    Last attempt to quit: 06/26/1978    Years since quitting: 40.3  . Smokeless tobacco: Never Used  Substance Use Topics  . Alcohol use: No  . Drug use: No     Family Hx: The patient's family history includes Heart disease in his father and mother; Hypertension in his sister.  ROS:   Please see the history of present illness.     All other systems reviewed and are negative.   Prior CV studies:   The following studies were reviewed today:  Echo 08/19/2017 Study Conclusions  - Left ventricle: The cavity size was normal. Systolic function was   vigorous. The estimated ejection fraction was in the range of 65%   to 70%. Wall motion was normal; there were no regional wall   motion abnormalities. The study was not technically sufficient to   allow evaluation of LV diastolic dysfunction due to atrial   fibrillation. - Aortic valve: Trileaflet. Moderate focal calcification involving   the noncoronary cusp. - Left atrium: The atrium was severely dilated. - Pulmonic valve: There was no regurgitation.   Labs/Other Tests and Data Reviewed:    EKG:  An ECG dated 08/17/2017 was personally reviewed today and demonstrated:  atrial fibrillation at 82 bpm.  Recent Labs: No results found for requested labs within last 8760 hours.   Recent Lipid Panel Lab Results  Component Value Date/Time   CHOL 173 08/18/2017 03:33 AM   CHOL 145 09/04/2016 09:54 AM   TRIG 162 (H) 08/18/2017 03:33 AM   HDL 34 (L) 08/18/2017 03:33 AM   HDL 33 (L) 09/04/2016 09:54 AM   CHOLHDL 5.1 08/18/2017 03:33 AM   LDLCALC 107 (H) 08/18/2017 03:33 AM   LDLCALC 77 09/04/2016 09:54 AM    Wt Readings from Last 3 Encounters:  10/22/18 197 lb (89.4 kg)  04/05/18 201 lb (91.2 kg)  10/08/17 199 lb 3.2 oz (90.4 kg)      Objective:    Vital Signs:  BP 112/66   Pulse 65   Ht 6' (1.829 m)   Wt 197 lb (89.4 kg)   BMI 26.72 kg/m    VITAL SIGNS:  reviewed GEN:  no acute distress RESPIRATORY:  Able to speak in full sentences with no audible wheezes of cough. NEURO:  alert and oriented x 3, no obvious focal deficit PSYCH:  normal affect  ASSESSMENT & PLAN:    1. Atrial fibrillation, permanent -Rate controlled with diltiazem and labetalol. HR 60's-70's per pt.  -On Xarelto for stoke risk reduction. No unusual bleeding noted. Pt encouraged on safety when he is at work. Discouraged climbing on ladders or heavy lifting.   2. Aortic dissection -Type B dissection in 07/2017. Followed by vascular, Dr.  Donzetta Matters. Doing well. Plan for follow up with Dr. Donzetta Matters in or around October. -Printed info provided at pt request.  -Will have him follow up with Dr. Acie Fredrickson in 9 months after seen by vascular.   3. Hypertension -BP well controlled. Discussed importance of good blood pressure control with hx of aortic dissection.  -Encourage low salt intake.   4. Hyperlipidemia -On Rosuvastatin 20 mg daily -Last lipid panel in epic was in 07/2017. LDL was 107. Had been down to 77 prior. Followed by PCP.   COVID-19 Education: The signs and symptoms of COVID-19 were discussed with the patient and how to seek care for testing (follow up with PCP or arrange E-visit).  The importance of social distancing was discussed today.  Time:   Today, I have spent 24 minutes with the patient with telehealth technology discussing the above problems.     Medication Adjustments/Labs and Tests Ordered: Current medicines are reviewed at length with the patient today.  Concerns regarding medicines are outlined above.   Tests Ordered: No orders of the defined types were placed in this encounter.   Medication Changes: No orders of the defined types were placed in this encounter.   Disposition:  Follow up in 9 month(s)  Signed, Daune Perch, NP  10/22/2018 4:19 PM    [facility]

## 2018-10-23 MED ORDER — XARELTO 20 MG PO TABS: 20.0000 mg | ORAL_TABLET | Freq: Every day | ORAL | 5 refills | Status: DC

## 2018-10-23 NOTE — Telephone Encounter (Signed)
Pt last saw Daune Perch, NP telemedicine COVID-19, last labs at [place] 07/19/18 Creat 0.86, age 78, weight 89.4kg, CrCl-93.85, based on CrCl pt is on appropriate dosage of Xarelto 20mg  QD.  Will refill rx.

## 2018-11-22 DIAGNOSIS — D693 Immune thrombocytopenic purpura: Secondary | ICD-10-CM | POA: Diagnosis not present

## 2018-12-09 ENCOUNTER — Encounter: Payer: Self-pay | Admitting: Cardiology

## 2019-04-24 DIAGNOSIS — D693 Immune thrombocytopenic purpura: Secondary | ICD-10-CM

## 2019-05-07 ENCOUNTER — Other Ambulatory Visit: Payer: Self-pay | Admitting: Vascular Surgery

## 2019-05-07 DIAGNOSIS — I7102 Dissection of abdominal aorta: Secondary | ICD-10-CM

## 2019-06-09 ENCOUNTER — Ambulatory Visit
Admission: RE | Admit: 2019-06-09 | Discharge: 2019-06-09 | Disposition: A | Discharge status: Home / Self Care | Payer: Medicare Other | Source: Ambulatory Visit | Attending: Vascular Surgery | Admitting: Vascular Surgery

## 2019-06-09 DIAGNOSIS — I7102 Dissection of abdominal aorta: Secondary | ICD-10-CM

## 2019-06-09 MED ORDER — IOPAMIDOL (ISOVUE-370) INJECTION 76%
75.0000 mL | Freq: Once | INTRAVENOUS | Status: AC | PRN
  Administered 2019-06-09: 75 mL via INTRAVENOUS

## 2019-06-13 ENCOUNTER — Encounter: Payer: Self-pay | Admitting: Vascular Surgery

## 2019-06-13 ENCOUNTER — Other Ambulatory Visit: Payer: Self-pay

## 2019-06-13 ENCOUNTER — Ambulatory Visit: Payer: Medicare Other | Admitting: Vascular Surgery

## 2019-06-13 VITALS — BP 121/76 | HR 70 | Temp 98.7°F | Resp 20 | Ht 72.0 in | Wt 196.5 lb

## 2019-06-13 DIAGNOSIS — I7102 Dissection of abdominal aorta: Secondary | ICD-10-CM

## 2019-06-13 NOTE — Progress Notes (Signed)
Patient ID: Jerome Chafe., male   DOB: 1940-08-11, 78 y.o.   MRN: [id number]  Reason for Consult: Follow-up   Referred by Street, Sharon Mt, *  Subjective:     HPI:  Jerome Viesca. is a 78 y.o. male history of type B IMH.  The time of his original diagnosis blood pressure was up over [id number] mmHg systolic.  At that time he was also diagnosed with ITP states that this has now cleared up.  Later had pulmonary embolus continues on Xarelto for this.  At the time had significant chest pain has not had any pain since.  Did have CT scan performed prior to arrival.  Only complaint today is rheumatoid arthritis which causes pain in his hands and his right knee and right foot.  Past Medical History:  Diagnosis Date  . A-fib ([place])   . A-fib [facility])    failed cardioversion  . Aortic dissection distal to left subclavian ([place]) 08/17/2017  . HTN (hypertension)   . Hypercholesteremia   . Intramural hematoma of descending thoracic and abdominal aorta 08/17/2017   Family History  Problem Relation Age of Onset  . Heart disease Mother   . Heart disease Father   . Hypertension Sister   . Coronary artery disease Mother   . Hypertension Mother   . Stroke Mother   . Atrial fibrillation Child    Past Surgical History:  Procedure Laterality Date  . BACK SURGERY    . CARDIOVERSION    . HAND SURGERY    . HAND SURGERY Right   . LASIK    . LUMBAR DISC SURGERY      Short Social History:  Social History   Tobacco Use  . Smoking status: Former Smoker    Types: Cigarettes    Quit date: 06/26/1978    Years since quitting: 40.9  . Smokeless tobacco: Never Used  Substance Use Topics  . Alcohol use: No    No Known Allergies  Current Outpatient Medications  Medication Sig Dispense Refill  . acetaminophen (TYLENOL) 325 MG tablet Take 325-650 mg by mouth every 6 (six) hours as needed for mild pain.    . calcium carbonate (OS-CAL) 600 MG TABS Take 600 mg by mouth 2 (two) times daily.       Marland Kitchen CALCIUM PO Take 1,000 mg by mouth daily.     . cetirizine (ZYRTEC) 10 MG tablet Take 10 mg by mouth daily.    . Cholecalciferol (VITAMIN D) 2000 UNITS CAPS Take by mouth daily.      . Coenzyme Q10 (CO Q 10 PO) Take 1 tablet by mouth 2 (two) times daily.     . dabigatran (PRADAXA) 150 MG CAPS Take 150 mg by mouth every 12 (twelve) hours.      Marland Kitchen diltiazem (CARDIZEM CD) 120 MG 24 hr capsule Take 1 capsule (120 mg total) by mouth daily. 30 capsule 1  . fenofibrate 160 MG tablet Take 160 mg by mouth daily.      . Flaxseed, Linseed, (FLAX SEED OIL PO) Take by mouth 4 (four) times a week.      . labetalol (NORMODYNE) 200 MG tablet Take 1 tablet (200 mg total) by mouth 2 (two) times daily. 60 tablet 1  . magnesium gluconate (MAGONATE) 500 MG tablet Take 500 mg by mouth daily.    . methotrexate (RHEUMATREX) 2.5 MG tablet Take 7.5 mg by mouth once a week. Pt takes 6 tablets once a week.    Marland Kitchen  metoprolol tartrate (LOPRESSOR) 25 MG tablet Take 25 mg by mouth 2 (two) times daily.      . montelukast (SINGULAIR) 10 MG tablet Take 10 mg by mouth at bedtime.    . Multiple Minerals (CALCIUM/MAGNESIUM/ZINC PO) Take by mouth.    . Multiple Vitamins-Minerals (CENTRUM SILVER PO) Take by mouth.    . polyvinyl alcohol (LIQUIFILM TEARS) 1.4 % ophthalmic solution Place 1 drop into both eyes as needed for dry eyes.    . rosuvastatin (CRESTOR) 20 MG tablet Take 20 mg by mouth daily.    . simvastatin (ZOCOR) 40 MG tablet Take 40 mg by mouth at bedtime.      . terbinafine (LAMISIL) 250 MG tablet Take 250 mg by mouth daily.    Alveda Reasons 20 MG TABS tablet Take 1 tablet (20 mg total) by mouth daily. 30 tablet 5   No current facility-administered medications for this visit.    Review of Systems  Constitutional:  Constitutional negative. HENT: HENT negative.  Eyes: Eyes negative.  Respiratory: Respiratory negative.  Cardiovascular: Cardiovascular negative.  GI: Gastrointestinal negative.  Musculoskeletal: Positive  for joint pain.  Skin: Skin negative.  Neurological: Neurological negative. Hematologic: Hematologic/lymphatic negative.  Psychiatric: Psychiatric negative.        Objective:  Objective   There were no vitals filed for this visit. There is no height or weight on file to calculate BMI.  Physical Exam HENT:     Head: Normocephalic.     Nose: Nose normal.  Eyes:     Pupils: Pupils are equal, round, and reactive to light.  Cardiovascular:     Rate and Rhythm: Normal rate.     Pulses:          Radial pulses are 2+ on the right side and 2+ on the left side.       Femoral pulses are 2+ on the right side and 2+ on the left side.      Popliteal pulses are 2+ on the right side and 2+ on the left side.  Pulmonary:     Effort: Pulmonary effort is normal.  Abdominal:     General: Abdomen is flat.     Palpations: Abdomen is soft.  Musculoskeletal:        General: No swelling. Normal range of motion.     Cervical back: Normal range of motion and neck supple.  Skin:    General: Skin is warm and dry.     Capillary Refill: Capillary refill takes less than 2 seconds.  Neurological:     General: No focal deficit present.     Mental Status: He is alert.  Psychiatric:        Mood and Affect: Mood normal.        Behavior: Behavior normal.        Thought Content: Thought content normal.        Judgment: Judgment normal.     Data: CT angio IMPRESSION: 1. Stable fusiform dilatation of the distal aortic arch 3.1 cm and proximal descending 4.1 cm. Intramural hematoma described previously is no longer visible. Recommend annual imaging followup by CTA or MRA. This recommendation follows 2010 ACCF/AHA/AATS/ACR/ASA/SCA/SCAI/SIR/STS/SVM Guidelines for the Diagnosis and Management of Patients with Thoracic Aortic Disease. Circulation.2010; 121: [id number] 2. Penetrating atheromatous ulcer, infrarenal abdominal aorta, with mild saccular dilatation up to 3 cm, previously 2.9 cm.  Recommend followup by [place] in 3 years. This recommendation follows ACR consensus guidelines: White Paper of the ACR Incidental Findings Committee II on  Vascular Findings. J Am Coll Radiol 2013; [id number] 3. Coronary and aortic atherosclerosis (ICD10-170.0). 4. Descending and sigmoid colon diverticulosis without evidence of diverticulitis. 5. Pulmonary emphysema (ICD10-J43.9).  We reviewed the CT scan together which looks similar to his previous scan with maximal diameter 4.1 cm.  We also reviewed the PAU in the abdominal aorta.  We compared this to his previous scan from April 2019.  Unfortunately his original scan from presentation was not available for review given that it was performed at [facility].     Assessment/Plan:    78 year old male follows up after having type B intramural hematoma.  No further residual hematoma is noticed on today CT scan and the aorta is stable in size greatest diameter 4.1 cm at the proximal descending aorta.  There is also a 3 cm penetrating ulcer in the distal abdominal aorta.  He will follow up in 1 year with repeat CT scan.  If his aorta is identical in size at that time we can likely give him 2-year follow-up from there.  He does demonstrate good understanding.      Waynetta Sandy MD [facility]

## 2019-07-11 DIAGNOSIS — R7303 Prediabetes: Secondary | ICD-10-CM | POA: Diagnosis not present

## 2019-07-11 DIAGNOSIS — I7103 Dissection of thoracoabdominal aorta: Secondary | ICD-10-CM | POA: Diagnosis not present

## 2019-07-11 DIAGNOSIS — Z79899 Other long term (current) drug therapy: Secondary | ICD-10-CM | POA: Diagnosis not present

## 2019-07-11 DIAGNOSIS — E785 Hyperlipidemia, unspecified: Secondary | ICD-10-CM | POA: Diagnosis not present

## 2019-07-11 DIAGNOSIS — I119 Hypertensive heart disease without heart failure: Secondary | ICD-10-CM | POA: Diagnosis not present

## 2019-07-15 DIAGNOSIS — R509 Fever, unspecified: Secondary | ICD-10-CM | POA: Diagnosis not present

## 2019-07-19 DIAGNOSIS — I7101 Dissection of thoracic aorta: Secondary | ICD-10-CM | POA: Diagnosis not present

## 2019-07-19 DIAGNOSIS — Z79899 Other long term (current) drug therapy: Secondary | ICD-10-CM | POA: Diagnosis not present

## 2019-07-19 DIAGNOSIS — I10 Essential (primary) hypertension: Secondary | ICD-10-CM | POA: Diagnosis not present

## 2019-07-19 DIAGNOSIS — M069 Rheumatoid arthritis, unspecified: Secondary | ICD-10-CM | POA: Diagnosis not present

## 2019-07-19 DIAGNOSIS — I4891 Unspecified atrial fibrillation: Secondary | ICD-10-CM | POA: Diagnosis not present

## 2019-07-19 DIAGNOSIS — J9601 Acute respiratory failure with hypoxia: Secondary | ICD-10-CM | POA: Diagnosis not present

## 2019-07-19 DIAGNOSIS — I480 Paroxysmal atrial fibrillation: Secondary | ICD-10-CM | POA: Diagnosis not present

## 2019-07-19 DIAGNOSIS — E782 Mixed hyperlipidemia: Secondary | ICD-10-CM | POA: Diagnosis not present

## 2019-07-19 DIAGNOSIS — R05 Cough: Secondary | ICD-10-CM | POA: Diagnosis not present

## 2019-07-19 DIAGNOSIS — Z87891 Personal history of nicotine dependence: Secondary | ICD-10-CM | POA: Diagnosis not present

## 2019-07-19 DIAGNOSIS — R0902 Hypoxemia: Secondary | ICD-10-CM | POA: Diagnosis not present

## 2019-07-19 DIAGNOSIS — Z7901 Long term (current) use of anticoagulants: Secondary | ICD-10-CM | POA: Diagnosis not present

## 2019-07-19 DIAGNOSIS — J1282 Pneumonia due to Coronavirus disease 2019: Secondary | ICD-10-CM | POA: Diagnosis not present

## 2019-07-19 DIAGNOSIS — R0602 Shortness of breath: Secondary | ICD-10-CM | POA: Diagnosis not present

## 2019-07-19 DIAGNOSIS — U071 COVID-19: Secondary | ICD-10-CM | POA: Diagnosis not present

## 2019-07-20 DIAGNOSIS — E782 Mixed hyperlipidemia: Secondary | ICD-10-CM | POA: Diagnosis not present

## 2019-07-20 DIAGNOSIS — R0902 Hypoxemia: Secondary | ICD-10-CM | POA: Diagnosis not present

## 2019-07-20 DIAGNOSIS — I10 Essential (primary) hypertension: Secondary | ICD-10-CM | POA: Diagnosis not present

## 2019-07-20 DIAGNOSIS — U071 COVID-19: Secondary | ICD-10-CM | POA: Diagnosis not present

## 2019-07-21 ENCOUNTER — Ambulatory Visit: Payer: Medicare Other | Admitting: Cardiovascular Disease

## 2019-07-21 DIAGNOSIS — R0902 Hypoxemia: Secondary | ICD-10-CM | POA: Diagnosis not present

## 2019-07-21 DIAGNOSIS — E782 Mixed hyperlipidemia: Secondary | ICD-10-CM | POA: Diagnosis not present

## 2019-07-21 DIAGNOSIS — I10 Essential (primary) hypertension: Secondary | ICD-10-CM | POA: Diagnosis not present

## 2019-07-21 DIAGNOSIS — U071 COVID-19: Secondary | ICD-10-CM | POA: Diagnosis not present

## 2019-07-22 DIAGNOSIS — U071 COVID-19: Secondary | ICD-10-CM | POA: Diagnosis not present

## 2019-07-22 DIAGNOSIS — E782 Mixed hyperlipidemia: Secondary | ICD-10-CM | POA: Diagnosis not present

## 2019-07-22 DIAGNOSIS — R0902 Hypoxemia: Secondary | ICD-10-CM | POA: Diagnosis not present

## 2019-07-22 DIAGNOSIS — I10 Essential (primary) hypertension: Secondary | ICD-10-CM | POA: Diagnosis not present

## 2019-07-23 DIAGNOSIS — U071 COVID-19: Secondary | ICD-10-CM | POA: Diagnosis not present

## 2019-07-23 DIAGNOSIS — E782 Mixed hyperlipidemia: Secondary | ICD-10-CM | POA: Diagnosis not present

## 2019-07-23 DIAGNOSIS — I10 Essential (primary) hypertension: Secondary | ICD-10-CM | POA: Diagnosis not present

## 2019-07-23 DIAGNOSIS — R0902 Hypoxemia: Secondary | ICD-10-CM | POA: Diagnosis not present

## 2019-07-31 DIAGNOSIS — Z6824 Body mass index (BMI) 24.0-24.9, adult: Secondary | ICD-10-CM | POA: Diagnosis not present

## 2019-07-31 DIAGNOSIS — M069 Rheumatoid arthritis, unspecified: Secondary | ICD-10-CM | POA: Diagnosis not present

## 2019-08-11 DIAGNOSIS — J22 Unspecified acute lower respiratory infection: Secondary | ICD-10-CM | POA: Diagnosis not present

## 2019-08-11 DIAGNOSIS — I119 Hypertensive heart disease without heart failure: Secondary | ICD-10-CM | POA: Diagnosis not present

## 2019-08-11 DIAGNOSIS — I7103 Dissection of thoracoabdominal aorta: Secondary | ICD-10-CM | POA: Diagnosis not present

## 2019-08-11 DIAGNOSIS — M069 Rheumatoid arthritis, unspecified: Secondary | ICD-10-CM | POA: Diagnosis not present

## 2019-08-11 DIAGNOSIS — I482 Chronic atrial fibrillation, unspecified: Secondary | ICD-10-CM | POA: Diagnosis not present

## 2019-08-12 ENCOUNTER — Other Ambulatory Visit: Payer: Self-pay

## 2019-08-12 NOTE — Patient Outreach (Signed)
[facility]) Care Management  08/12/2019  [name]. 20-Nov-1940 [id number]   Medication Adherence call to Mr. Jerome Sullivan Hipp[place] Identifiers Verify spoke with patients wife,patient is showing past due on Rosuvastatin 40 mg,wife explain patient has been in the hospital with corona virus for a week reason why patient has falling a bit behind on his medication,patient has two weeks worth and will order when due. [name] is showing past due under [place].  [facility] Management Direct Dial [phone]  Fax [phone] [email or web address]

## 2019-08-13 DIAGNOSIS — M2550 Pain in unspecified joint: Secondary | ICD-10-CM | POA: Diagnosis not present

## 2019-08-13 DIAGNOSIS — R5382 Chronic fatigue, unspecified: Secondary | ICD-10-CM | POA: Diagnosis not present

## 2019-08-13 DIAGNOSIS — Z862 Personal history of diseases of the blood and blood-forming organs and certain disorders involving the immune mechanism: Secondary | ICD-10-CM | POA: Diagnosis not present

## 2019-08-13 DIAGNOSIS — M0579 Rheumatoid arthritis with rheumatoid factor of multiple sites without organ or systems involvement: Secondary | ICD-10-CM | POA: Diagnosis not present

## 2019-08-29 ENCOUNTER — Other Ambulatory Visit: Payer: Self-pay

## 2019-08-29 ENCOUNTER — Ambulatory Visit: Payer: Medicare Other | Admitting: Cardiovascular Disease

## 2019-08-29 ENCOUNTER — Encounter: Payer: Self-pay | Admitting: Cardiovascular Disease

## 2019-08-29 VITALS — BP 142/78 | HR 74 | Ht 72.0 in | Wt 186.8 lb

## 2019-08-29 DIAGNOSIS — E7800 Pure hypercholesterolemia, unspecified: Secondary | ICD-10-CM

## 2019-08-29 DIAGNOSIS — I10 Essential (primary) hypertension: Secondary | ICD-10-CM | POA: Diagnosis not present

## 2019-08-29 LAB — BASIC METABOLIC PANEL
BUN/Creatinine Ratio: 15 (ref 10–24)
BUN: 14 mg/dL (ref 8–27)
CO2: 25 mmol/L (ref 20–29)
Calcium: 9.7 mg/dL (ref 8.6–10.2)
Chloride: 102 mmol/L (ref 96–106)
Creatinine, Ser: 0.93 mg/dL (ref 0.76–1.27)
GFR calc Af Amer: 92 mL/min/{1.73_m2} (ref >59)
GFR calc non Af Amer: 79 mL/min/{1.73_m2} (ref >59)
Glucose: 77 mg/dL (ref 65–99)
Potassium: 4.4 mmol/L (ref 3.5–5.2)
Sodium: 140 mmol/L (ref 134–144)

## 2019-08-29 LAB — HEPATIC FUNCTION PANEL
ALT: 10 IU/L (ref 0–44)
AST: 25 IU/L (ref 0–40)
Albumin: 3.9 g/dL (ref 3.7–4.7)
Alkaline Phosphatase: 51 IU/L (ref 39–117)
Bilirubin Total: 0.7 mg/dL (ref 0.0–1.2)
Bilirubin, Direct: 0.2 mg/dL (ref 0.00–0.40)
Total Protein: 6.7 g/dL (ref 6.0–8.5)

## 2019-08-29 LAB — LIPID PANEL
Chol/HDL Ratio: 2.7 ratio (ref 0.0–5.0)
Cholesterol, Total: 141 mg/dL (ref 100–199)
HDL: 53 mg/dL (ref >39)
LDL Chol Calc (NIH): 74 mg/dL (ref 0–99)
Triglycerides: 69 mg/dL (ref 0–149)
VLDL Cholesterol Cal: 14 mg/dL (ref 5–40)

## 2019-08-29 MED ORDER — CHLORTHALIDONE 25 MG PO TABS: 25.0000 mg | ORAL_TABLET | Freq: Every day | ORAL | 3 refills | Status: DC

## 2019-08-29 MED ORDER — POTASSIUM CHLORIDE ER 10 MEQ PO TBCR: 10.0000 meq | EXTENDED_RELEASE_TABLET | Freq: Every day | ORAL | 3 refills | Status: DC

## 2019-08-29 NOTE — Progress Notes (Signed)
Cardiology Office Note   Date:  08/29/2019   ID:  Jerome So Grenora., DOB [birth year], MRN [id number]  PCP:  Street, Sharon Mt, MD  Cardiologist:   Mertie Moores, MD   No chief complaint on file.  Problem list: 1. Atrial fibrillation 2. Hypertension 3. Hyperlipidemia  notes prior to 2014:  Jerome Sullivan is doing well. We tried Metoprolol 50 BID but this caused significant dyspnea. He reduced his dose back down to 25 BID and now feels well.  September 24, 2012: Jerome Sullivan is doing well. No problems. He has some dyspne . He works in the garden. - does not walk regularly. He is still eating salt / sea salt.  Nov. 21, 2014: Jerome Sullivan is doing ok. No CP , no dypsnea. Not walking regularly. Trying to avoid some salt. Has GERD symptoms - especially when he lies down.   Nov. 5, 2015: Jerome Sullivan is doing ok. Still eating some salt  Feb. 9, 2016: Jerome Sullivan is a 79 y.o. male who presents for follow up of his Afib and HTN Jerome Sullivan is doing ok.  Went rabbit hunting 2 days ago.  No CP or dypsnea with walking.  Just some leg cramping afterwards.   Sept. 6, 2016:  Doing well.  Has some dyspnea.  No CP    August 31, 2015:  Has some DOE climbing stairs.  Has some right foot pain - has plantar fascitis.   September 04, 2016:  Jerome Sullivan is seen today for follow-up of his atrial fibrillation. He also has a history of hypertension and hyperlipidemia. Has some shortness of breath .   Worse this year.  Still does rabbit hunting   October 08, 2017   Jerome Sullivan is seen today for follow-up of his hypertension and atrial fibrillation and a recent type B aortic dissection .  Was admitted in Feb with aortic dissection - type B  BP is now well controlled   Echocardiogram performed February, 2019 reveals normal left ventricular systolic function with an ejection fraction of 65-70%.  We were not able to evaluate diastolic function because of his atrial fibrillation. Saw Dr. Donzetta Matters recently - aorta looks great .  HR has  been elevated   August 29, 2019:  Jerome Sullivan is seen today for follow-up of his atrial fibrillation and type B aortic dissection.  He is followed by Dr[facility] Donzetta [facility]. Had COVID in January.   Getting over it fairly well.  Lost 29 lbs.  Breathing has improved.  BP is mildly elevated - is normally around 140   Past Medical History:  Diagnosis Date   A-fib ([place])    A-fib ([place])    failed cardioversion   Aortic dissection distal to left subclavian ([place]) 08/17/2017   HTN (hypertension)    Hypercholesteremia    Intramural hematoma of descending thoracic and abdominal aorta 08/17/2017    Past Surgical History:  Procedure Laterality Date   BACK SURGERY     CARDIOVERSION     HAND SURGERY     HAND SURGERY Right    LASIK     LUMBAR DISC SURGERY       Current Outpatient Medications  Medication Sig Dispense Refill   acetaminophen (TYLENOL) 325 MG tablet Take 325-650 mg by mouth every 6 (six) hours as needed for mild pain.     calcium carbonate (OS-CAL) 600 MG TABS Take 600 mg by mouth 2 (two) times daily.       CALCIUM PO Take 1,000 mg by  mouth daily.      cetirizine (ZYRTEC) 10 MG tablet Take 10 mg by mouth daily.     Cholecalciferol (VITAMIN D) 2000 UNITS CAPS Take by mouth daily.       Coenzyme Q10 (CO Q 10 PO) Take 1 tablet by mouth 2 (two) times daily.      diltiazem (CARDIZEM CD) 120 MG 24 hr capsule Take 1 capsule (120 mg total) by mouth daily. 30 capsule 1   fenofibrate 160 MG tablet Take 160 mg by mouth daily.       Flaxseed, Linseed, (FLAX SEED OIL PO) Take by mouth 4 (four) times a week.       labetalol (NORMODYNE) 200 MG tablet Take 1 tablet (200 mg total) by mouth 2 (two) times daily. 60 tablet 1   magnesium gluconate (MAGONATE) 500 MG tablet Take 500 mg by mouth daily.     methotrexate (RHEUMATREX) 2.5 MG tablet Take 7.5 mg by mouth once a week. Pt takes 6 tablets once a week.     metoprolol tartrate (LOPRESSOR) 25 MG tablet Take 25  mg by mouth 2 (two) times daily.       montelukast (SINGULAIR) 10 MG tablet Take 10 mg by mouth at bedtime.     Multiple Minerals (CALCIUM/MAGNESIUM/ZINC PO) Take by mouth.     Multiple Vitamins-Minerals (CENTRUM SILVER PO) Take by mouth.     polyvinyl alcohol (LIQUIFILM TEARS) 1.4 % ophthalmic solution Place 1 drop into both eyes as needed for dry eyes.     rosuvastatin (CRESTOR) 20 MG tablet Take 20 mg by mouth daily.     terbinafine (LAMISIL) 250 MG tablet Take 250 mg by mouth daily.     XARELTO 20 MG TABS tablet Take 1 tablet (20 mg total) by mouth daily. 30 tablet 5   No current facility-administered medications for this visit.    Allergies:   Patient has no known allergies.    Social History:  The patient  reports that he quit smoking about 41 years ago. His smoking use included cigarettes. He has never used smokeless tobacco. He reports that he does not drink alcohol or use drugs.   Family History:  The patient's family history includes Atrial fibrillation in his child; Coronary artery disease in his mother; Heart disease in his father and mother; Hypertension in his mother and sister; Stroke in his mother.    ROS: Noted in current history, all other review of systems are negative.   Physical Exam: Blood pressure (!) 142/78, pulse 74, height 6' (1.829 m), weight 186 lb 12.8 oz (84.7 kg), SpO2 97 %.  GEN: Elderly gentleman, no acute distress HEENT: Normal NECK: No JVD; No carotid bruits LYMPHATICS: No lymphadenopathy CARDIAC:  Irreg. Irreg.  RESPIRATORY:  Clear to auscultation without rales, wheezing or rhonchi  ABDOMEN: Soft, non-tender, non-distended MUSCULOSKELETAL:  No edema; No deformity  SKIN: Warm and dry NEUROLOGIC:  Alert and oriented x 3   EKG:    August 29, 2019: Atrial fibrillation at 74 beats a minute.  No ST or T wave changes.  Recent Labs: No results found for requested labs within last 8760 hours.    Lipid Panel    Component Value Date/Time    CHOL 173 08/18/2017 0333   CHOL 145 09/04/2016 0954   TRIG 162 (H) 08/18/2017 0333   HDL 34 (L) 08/18/2017 0333   HDL 33 (L) 09/04/2016 0954   CHOLHDL 5.1 08/18/2017 0333   VLDL 32 08/18/2017 0333   LDLCALC 107 (H)  08/18/2017 0333   LDLCALC 77 09/04/2016 0954      Wt Readings from Last 3 Encounters:  08/29/19 186 lb 12.8 oz (84.7 kg)  06/13/19 196 lb 8 oz (89.1 kg)  10/22/18 197 lb (89.4 kg)      Other studies Reviewed: Additional studies/ records that were reviewed today include: . Review of the above records demonstrates:    ASSESSMENT AND PLAN:  Problem list: 1. Atrial fibrillation-   has chronic atrial fibrillation.  Seems to be well controlled.  He is currently on Xarelto 20 mg a day.  2..  Aortic dissection:    He had a type B dissection.  He seems to be stable.  Continue labetalol.  He has metoprolol listed on his medication list but I think this is a duplicate as it is not on his medication list that he brought from home.  Blood pressure remains a little elevated and we will adjust his medications.  2. Hypertension-       blood pressure remains elevated.  His systolic is in the 0000000.  He admits to eating more salty foods including lots of hotdogs.  We will add chlorthalidone 25 mg a day and potassium chloride 10 mEq a day.  We will check a basic metabolic profile in 3 weeks.  3. Hyperlipidemia-continue rosuvastatin 20 mg a day.  We will check lipids and liver enzymes today.  Encouraged him to start a walking program.   Current medicines are reviewed at length with the patient today.  The patient does not have concerns regarding medicines.  The following changes have been made:  no change   Disposition:   FU with me in 1 year.    Signed, Mertie Moores, MD  08/29/2019 10:02 AM    [facility] [address], [place], [place]  [address] Phone: [phone]; Fax: [phone]

## 2019-08-29 NOTE — Patient Instructions (Signed)
Medication Instructions:  1) START CHLORTHALIDONE 25 mg daily 2) START KDUR 10 meq daily *If you need a refill on your cardiac medications before your next appointment, please call your pharmacy*  Lab Work: TODAY: lipid, liver, BMET  IN THREE WEEKS: BMET (you do not need to be fasting)   Follow-Up: At [facility], you and your health needs are our priority.  As part of our continuing mission to provide you with exceptional heart care, we have created designated Provider Care Teams.  These Care Teams include your primary Cardiologist (physician) and Advanced Practice Providers (APPs -  Physician Assistants and Nurse Practitioners) who all work together to provide you with the care you need, when you need it. Your next appointment:   6 month(s) The format for your next appointment:   In Person Provider:   You may see Mertie Moores, MD or one of the following Advanced Practice Providers on your designated Care Team:    Richardson Dopp, PA-C  [place], [place]  Daune Perch, [place]

## 2019-09-19 DIAGNOSIS — E7800 Pure hypercholesterolemia, unspecified: Secondary | ICD-10-CM | POA: Diagnosis not present

## 2019-09-19 DIAGNOSIS — I10 Essential (primary) hypertension: Secondary | ICD-10-CM | POA: Diagnosis not present

## 2019-09-20 LAB — BASIC METABOLIC PANEL
BUN/Creatinine Ratio: 16 (ref 10–24)
BUN: 16 mg/dL (ref 8–27)
CO2: 28 mmol/L (ref 20–29)
Calcium: 9.5 mg/dL (ref 8.6–10.2)
Chloride: 98 mmol/L (ref 96–106)
Creatinine, Ser: 1.03 mg/dL (ref 0.76–1.27)
GFR calc Af Amer: 81 mL/min/{1.73_m2} (ref >59)
GFR calc non Af Amer: 70 mL/min/{1.73_m2} (ref >59)
Glucose: 116 mg/dL — ABNORMAL HIGH (ref 65–99)
Potassium: 3.5 mmol/L (ref 3.5–5.2)
Sodium: 140 mmol/L (ref 134–144)

## 2019-09-24 DIAGNOSIS — D693 Immune thrombocytopenic purpura: Secondary | ICD-10-CM | POA: Diagnosis not present

## 2019-09-24 DIAGNOSIS — Z7902 Long term (current) use of antithrombotics/antiplatelets: Secondary | ICD-10-CM | POA: Diagnosis not present

## 2019-09-24 DIAGNOSIS — M7121 Synovial cyst of popliteal space [Baker], right knee: Secondary | ICD-10-CM | POA: Diagnosis not present

## 2019-09-24 DIAGNOSIS — M069 Rheumatoid arthritis, unspecified: Secondary | ICD-10-CM | POA: Diagnosis not present

## 2019-09-24 DIAGNOSIS — I4891 Unspecified atrial fibrillation: Secondary | ICD-10-CM | POA: Diagnosis not present

## 2019-09-24 DIAGNOSIS — Z86718 Personal history of other venous thrombosis and embolism: Secondary | ICD-10-CM | POA: Diagnosis not present

## 2019-09-24 DIAGNOSIS — Z86711 Personal history of pulmonary embolism: Secondary | ICD-10-CM | POA: Diagnosis not present

## 2019-10-13 DIAGNOSIS — K1379 Other lesions of oral mucosa: Secondary | ICD-10-CM | POA: Diagnosis not present

## 2019-10-13 DIAGNOSIS — I119 Hypertensive heart disease without heart failure: Secondary | ICD-10-CM | POA: Diagnosis not present

## 2019-10-13 DIAGNOSIS — I482 Chronic atrial fibrillation, unspecified: Secondary | ICD-10-CM | POA: Diagnosis not present

## 2019-10-13 DIAGNOSIS — D6869 Other thrombophilia: Secondary | ICD-10-CM | POA: Diagnosis not present

## 2019-10-30 ENCOUNTER — Other Ambulatory Visit: Payer: Self-pay | Admitting: Cardiovascular Disease

## 2019-10-30 NOTE — Telephone Encounter (Signed)
Prescription refill request for Xarelto received.   Last office visit: Nahser, 08/29/2019 Weight: 84.7 kg Age: 79 y.o. Scr: 1.03, 09/19/2019 CrCl: 73.1 ml/min    Prescription refill sent.

## 2019-11-10 DIAGNOSIS — M2550 Pain in unspecified joint: Secondary | ICD-10-CM | POA: Diagnosis not present

## 2019-11-10 DIAGNOSIS — R5382 Chronic fatigue, unspecified: Secondary | ICD-10-CM | POA: Diagnosis not present

## 2019-11-10 DIAGNOSIS — M0579 Rheumatoid arthritis with rheumatoid factor of multiple sites without organ or systems involvement: Secondary | ICD-10-CM | POA: Diagnosis not present

## 2019-12-16 DIAGNOSIS — H18413 Arcus senilis, bilateral: Secondary | ICD-10-CM | POA: Diagnosis not present

## 2019-12-18 DIAGNOSIS — E785 Hyperlipidemia, unspecified: Secondary | ICD-10-CM | POA: Diagnosis not present

## 2019-12-18 DIAGNOSIS — Z0000 Encounter for general adult medical examination without abnormal findings: Secondary | ICD-10-CM | POA: Diagnosis not present

## 2019-12-18 DIAGNOSIS — K1379 Other lesions of oral mucosa: Secondary | ICD-10-CM | POA: Diagnosis not present

## 2020-01-07 DIAGNOSIS — D696 Thrombocytopenia, unspecified: Secondary | ICD-10-CM | POA: Diagnosis not present

## 2020-02-10 DIAGNOSIS — M2550 Pain in unspecified joint: Secondary | ICD-10-CM | POA: Diagnosis not present

## 2020-02-10 DIAGNOSIS — M0579 Rheumatoid arthritis with rheumatoid factor of multiple sites without organ or systems involvement: Secondary | ICD-10-CM | POA: Diagnosis not present

## 2020-02-10 DIAGNOSIS — Z862 Personal history of diseases of the blood and blood-forming organs and certain disorders involving the immune mechanism: Secondary | ICD-10-CM | POA: Diagnosis not present

## 2020-02-10 DIAGNOSIS — R5382 Chronic fatigue, unspecified: Secondary | ICD-10-CM | POA: Diagnosis not present

## 2020-02-10 DIAGNOSIS — M792 Neuralgia and neuritis, unspecified: Secondary | ICD-10-CM | POA: Diagnosis not present

## 2020-04-20 ENCOUNTER — Telehealth: Payer: Self-pay | Admitting: Cardiovascular Disease

## 2020-04-20 DIAGNOSIS — D696 Thrombocytopenia, unspecified: Secondary | ICD-10-CM | POA: Diagnosis not present

## 2020-04-20 NOTE — Telephone Encounter (Signed)
Dr. Hinton Rao called on behalf of the patient, she would like to speak with Dr. Acie Fredrickson regarding the patients medication management due to blood work. Patient is on a dieretic but was not sure what it is called. Potassium is at 2.9. She said she could prescibe something but would prefer to speak with Dr. Acie Fredrickson before making changes/giving something new.    If Dr. Hinton Rao is unavailable she asked that we reach out to the patient at [phone]. Please call/advise. Thank you!

## 2020-04-20 NOTE — Telephone Encounter (Signed)
Left message for patient to call back  

## 2020-04-20 NOTE — Telephone Encounter (Signed)
Pt is on Chlorthalidone 25 mg a day  He is not on enough Kdur - currently on Kdur 10 meq a day according to our last note in March, 2021 Rosann Auerbach, will you verify that this is what he is actually taking .  Assuming he is taking the above.  Please have him take Kdur 20 meq BID for the next 3 days and then Please increase his Kdur to 20 meq a day  BMP in 1 week and again in 3 weeks.   Thanks PN

## 2020-04-21 NOTE — Telephone Encounter (Signed)
Left second message for patient to call back.

## 2020-04-26 ENCOUNTER — Telehealth: Payer: Self-pay | Admitting: Cardiovascular Disease

## 2020-04-26 ENCOUNTER — Other Ambulatory Visit: Payer: Self-pay | Admitting: Cardiovascular Disease

## 2020-04-26 NOTE — Telephone Encounter (Signed)
New Message:    Pt would like to switch from Dr Acie Fredrickson to Dr Agustin Cree. He wants to switch, because it is closer to his home.

## 2020-04-26 NOTE — Telephone Encounter (Signed)
See previous phone note.  

## 2020-04-26 NOTE — Telephone Encounter (Signed)
Left the patient a message regarding the following:   Nahser, Wonda Cheng, MD       Pt is on Chlorthalidone 25 mg a day  He is not on enough Kdur - currently on Ritchie 10 meq a day according to our last note in March, 2021 Rosann Auerbach, will you verify that this is what he is actually taking .  Assuming he is taking the above.  Please have him take Kdur 20 meq BID for the next 3 days and then Please increase his Kdur to 20 meq a day  BMP in 1 week and again in 3 weeks.   Thanks PN

## 2020-04-26 NOTE — Telephone Encounter (Signed)
Patient's wife called back. She reports patient has increased potassium to 20 meq twice daily as instructed by Dr Ebony Hail office last week.  He is having repeat lab work this week.  I told wife to disregard instructions left in voicemail as this has already been addressed by Dr Durward Parcel.

## 2020-04-26 NOTE — Addendum Note (Signed)
Addended by: Thompson Grayer on: 04/26/2020 11:28 AM   Modules accepted: Orders

## 2020-04-26 NOTE — Telephone Encounter (Signed)
Xarelto 20mg  refill request received. Pt is 79 years old, weight-84.7kg, Crea-1.03 on 09/19/2019, last seen by Dr. Acie Fredrickson on 08/29/2019, Diagnosis-Afib, CrCl-71.31ml/min; Dose is appropriate based on dosing criteria. Will send in refill to requested pharmacy.

## 2020-04-26 NOTE — Telephone Encounter (Signed)
New message:     Patient calling to get results.

## 2020-04-26 NOTE — Telephone Encounter (Signed)
I spoke with patient's wife.  She states she received message below.  Wife reports patient was instructed by Dr Venetia Maxon to increase a medication last week based on lab results done at Dr Remi Deter office.  Wife is not home at this time and does not have medication bottles with her and does not know what medication patient was instructed to increase.  He is scheduled for follow up lab work this week. I told wife to disregard instructions from our office until it could be clarified what medication was increased by Dr Venetia Maxon.  Wife will call us back when she gets home with medication information.

## 2020-04-27 NOTE — Telephone Encounter (Signed)
Ok with me 

## 2020-04-27 NOTE — Telephone Encounter (Signed)
Fine with me

## 2020-04-29 DIAGNOSIS — D693 Immune thrombocytopenic purpura: Secondary | ICD-10-CM | POA: Diagnosis not present

## 2020-04-29 DIAGNOSIS — E785 Hyperlipidemia, unspecified: Secondary | ICD-10-CM | POA: Diagnosis not present

## 2020-04-29 DIAGNOSIS — R7303 Prediabetes: Secondary | ICD-10-CM | POA: Diagnosis not present

## 2020-05-17 DIAGNOSIS — D6869 Other thrombophilia: Secondary | ICD-10-CM | POA: Diagnosis not present

## 2020-05-17 DIAGNOSIS — I482 Chronic atrial fibrillation, unspecified: Secondary | ICD-10-CM | POA: Diagnosis not present

## 2020-05-17 DIAGNOSIS — Z23 Encounter for immunization: Secondary | ICD-10-CM | POA: Diagnosis not present

## 2020-05-17 DIAGNOSIS — D693 Immune thrombocytopenic purpura: Secondary | ICD-10-CM | POA: Diagnosis not present

## 2020-05-17 DIAGNOSIS — I119 Hypertensive heart disease without heart failure: Secondary | ICD-10-CM | POA: Diagnosis not present

## 2020-06-04 ENCOUNTER — Other Ambulatory Visit: Payer: Self-pay

## 2020-06-07 ENCOUNTER — Other Ambulatory Visit: Payer: Self-pay

## 2020-06-07 ENCOUNTER — Ambulatory Visit: Payer: Medicare Other | Admitting: Cardiology

## 2020-06-07 ENCOUNTER — Encounter: Payer: Self-pay | Admitting: Cardiology

## 2020-06-07 VITALS — BP 122/58 | HR 52 | Ht 71.0 in | Wt 198.0 lb

## 2020-06-07 DIAGNOSIS — I7101 Dissection of thoracic aorta: Secondary | ICD-10-CM | POA: Diagnosis not present

## 2020-06-07 DIAGNOSIS — E7800 Pure hypercholesterolemia, unspecified: Secondary | ICD-10-CM

## 2020-06-07 DIAGNOSIS — I4821 Permanent atrial fibrillation: Secondary | ICD-10-CM

## 2020-06-07 DIAGNOSIS — I71019 Dissection of thoracic aorta, unspecified: Secondary | ICD-10-CM

## 2020-06-07 DIAGNOSIS — T148XXA Other injury of unspecified body region, initial encounter: Secondary | ICD-10-CM

## 2020-06-07 DIAGNOSIS — I10 Essential (primary) hypertension: Secondary | ICD-10-CM | POA: Diagnosis not present

## 2020-06-07 NOTE — Patient Instructions (Signed)

## 2020-06-07 NOTE — Progress Notes (Signed)
Cardiology Office Note:    Date:  06/07/2020   ID:  Jerome So Knierim., DOB Nov 05, 1940, MRN [id number]  PCP:  Street, Sharon Mt, MD  Cardiologist:  Jenne Campus, MD    Referring MD: Street, Sharon Mt, *   Chief Complaint  Patient presents with  . Follow-up  I am doing fine  History of Present Illness:    Jerome Rister. is a 79 y.o. male with past medical history significant for permanent atrial fibrillation, anticoagulated with Xarelto, also a type B aortic dissection diagnosed years ago with intramural hematoma, also penetrating ulcer of the abdominal aorta followed by vascular surgeon, essential hypertension, dyslipidemia.  He would like to be follow-up in our office in [place] since this is closer to his location.  Denies have any chest pain tightness squeezing pressure burning chest.  There is no palpitations no dizziness no passing out overall he seems to be doing well.  Past Medical History:  Diagnosis Date  . A-fib ([place])   . A-fib [facility])    failed cardioversion  . Aortic aneurysm ([place]) 08/17/2017  . Aortic dissection distal to left subclavian ([place]) 08/17/2017  . Atrial fibrillation ([place]) 09/15/2010  . HTN (hypertension)   . Hypercholesteremia   . Hypertension 09/15/2010  . Intramural hematoma of descending thoracic and abdominal aorta 08/17/2017    Past Surgical History:  Procedure Laterality Date  . BACK SURGERY    . CARDIOVERSION    . HAND SURGERY    . HAND SURGERY Right   . LASIK    . LUMBAR DISC SURGERY      Current Medications: Current Meds  Medication Sig  . calcium carbonate (OS-CAL) 600 MG TABS Take 600 mg by mouth 2 (two) times daily.  Marland Kitchen CALCIUM PO Take 1,000 mg by mouth daily.  . cetirizine (ZYRTEC) 10 MG tablet Take 10 mg by mouth daily.  . Coenzyme Q10 (CO Q 10 PO) Take 1 tablet by mouth 2 (two) times daily.  Marland Kitchen diltiazem (CARDIZEM CD) 120 MG 24 hr capsule Take 1 capsule (120 mg total) by mouth daily.  . fluticasone (FLONASE) 50  MCG/ACT nasal spray Place 1 spray into both nostrils daily.  . folic acid (FOLVITE) 1 MG tablet Take 1 mg by mouth daily.  Marland Kitchen labetalol (NORMODYNE) 200 MG tablet Take 1 tablet (200 mg total) by mouth 2 (two) times daily.  . methotrexate (RHEUMATREX) 2.5 MG tablet Take 7.5 mg by mouth once a week. Pt takes 6 tablets once a week.  . methotrexate (RHEUMATREX) 2.5 MG tablet Take 1 tablet by mouth once a week.  . Multiple Minerals (CALCIUM/MAGNESIUM/ZINC PO) Take by mouth.  . potassium chloride SA (KLOR-CON) 20 MEQ tablet Take 20 mEq by mouth 2 (two) times daily.  . rosuvastatin (CRESTOR) 20 MG tablet Take 20 mg by mouth daily.  Alveda Reasons 20 MG TABS tablet TAKE ONE (1) TABLET BY MOUTH EVERY DAY  . [DISCONTINUED] montelukast (SINGULAIR) 10 MG tablet Take 10 mg by mouth at bedtime.     Allergies:   Patient has no known allergies.   Social History   Socioeconomic History  . Marital status: Married    Spouse name: Not on file  . Number of children: Not on file  . Years of education: Not on file  . Highest education level: Not on file  Occupational History  . Not on file  Tobacco Use  . Smoking status: Former Smoker    Types: Cigarettes    Quit date: 06/26/1978  Years since quitting: 41.9  . Smokeless tobacco: Never Used  Vaping Use  . Vaping Use: Never used  Substance and Sexual Activity  . Alcohol use: No  . Drug use: No  . Sexual activity: Yes    Partners: Female    Birth control/protection: None  Other Topics Concern  . Not on file  Social History Narrative   ** Merged History Encounter **       Social Determinants of Health   Financial Resource Strain: Not on file  Food Insecurity: Not on file  Transportation Needs: Not on file  Physical Activity: Not on file  Stress: Not on file  Social Connections: Not on file     Family History: The patient's family history includes Atrial fibrillation in his child; Coronary artery disease in his mother; Heart disease in his father  and mother; Hypertension in his mother and sister; Stroke in his mother. ROS:   Please see the history of present illness.    All 14 point review of systems negative except as described per history of present illness  EKGs/Labs/Other Studies Reviewed:      Recent Labs: 08/29/2019: ALT 10 09/19/2019: BUN 16; Creatinine, Ser 1.03; Potassium 3.5; Sodium 140  Recent Lipid Panel    Component Value Date/Time   CHOL 141 08/29/2019 1034   TRIG 69 08/29/2019 1034   HDL 53 08/29/2019 1034   CHOLHDL 2.7 08/29/2019 1034   CHOLHDL 5.1 08/18/2017 0333   VLDL 32 08/18/2017 0333   LDLCALC 74 08/29/2019 1034    Physical Exam:    VS:  BP (!) 122/58 (BP Location: Right Arm, Patient Position: Sitting)   Pulse (!) 52   Ht 5\' 11"  (1.803 m)   Wt 198 lb (89.8 kg)   SpO2 97%   BMI 27.62 kg/m     Wt Readings from Last 3 Encounters:  06/07/20 198 lb (89.8 kg)  08/29/19 186 lb 12.8 oz (84.7 kg)  06/13/19 196 lb 8 oz (89.1 kg)     GEN:  Well nourished, well developed in no acute distress HEENT: Normal NECK: No JVD; No carotid bruits LYMPHATICS: No lymphadenopathy CARDIAC: RRR, no murmurs, no rubs, no gallops RESPIRATORY:  Clear to auscultation without rales, wheezing or rhonchi  ABDOMEN: Soft, non-tender, non-distended MUSCULOSKELETAL:  No edema; No deformity  SKIN: Warm and dry LOWER EXTREMITIES: no swelling NEUROLOGIC:  Alert and oriented x 3 PSYCHIATRIC:  Normal affect   ASSESSMENT:    1. Permanent atrial fibrillation ([place])   2. Primary hypertension   3. Intramural hematoma of descending thoracic and abdominal aorta   4. Aortic dissection distal to left subclavian ([facility])   5. Hypercholesteremia    PLAN:    In order of problems listed above:  1. Permanent atrial fibrillation: Rate appears to be controlled, he is anticoagulated he understands why he is on anticoagulation, AV node suppression has been accomplished with labetalol and I will continue since his rate appears to be well  controlled.  He is also taking Cardizem. 2. Essential hypertension, blood pressure today in the office 122/58 good control continue present management. 3. Type B aortic dissection stable followed by vascular surgeon.  I did review his CT from December of last year almost exactly today.  He is scheduled to see vascular surgeon in January.  Ask him to follow-up with them he will require another CT.  If it stable we can follow him up here if is stable he may require CT every 2 years.  The key  will be also to control his blood pressure well which is already accomplished as well as about isovolumetric exercise and discussed this with him. 4. Dyslipidemia: I did review his fasting lipid profile from [facility] done on 04/29/2020 showing LDL of 61 HDL 36, he is taking high intensity statin form of Crestor 20 which I will continue.   Medication Adjustments/Labs and Tests Ordered: Current medicines are reviewed at length with the patient today.  Concerns regarding medicines are outlined above.  No orders of the defined types were placed in this encounter.  Medication changes: No orders of the defined types were placed in this encounter.   Signed, Park Liter, MD, [facility] 06/07/2020 2:43 PM    [place]

## 2020-06-07 NOTE — Addendum Note (Signed)
Addended by: Senaida Ores on: 06/07/2020 03:07 PM   Modules accepted: Orders

## 2020-07-09 ENCOUNTER — Other Ambulatory Visit: Payer: Medicare Other

## 2020-07-12 ENCOUNTER — Other Ambulatory Visit: Payer: Medicare Other

## 2020-08-09 ENCOUNTER — Other Ambulatory Visit: Payer: Self-pay | Admitting: Cardiovascular Disease

## 2020-08-12 DIAGNOSIS — E663 Overweight: Secondary | ICD-10-CM | POA: Diagnosis not present

## 2020-08-12 DIAGNOSIS — M0579 Rheumatoid arthritis with rheumatoid factor of multiple sites without organ or systems involvement: Secondary | ICD-10-CM | POA: Diagnosis not present

## 2020-08-12 DIAGNOSIS — M792 Neuralgia and neuritis, unspecified: Secondary | ICD-10-CM | POA: Diagnosis not present

## 2020-08-12 DIAGNOSIS — M2550 Pain in unspecified joint: Secondary | ICD-10-CM | POA: Diagnosis not present

## 2020-08-12 DIAGNOSIS — Z6826 Body mass index (BMI) 26.0-26.9, adult: Secondary | ICD-10-CM | POA: Diagnosis not present

## 2020-08-12 DIAGNOSIS — Z862 Personal history of diseases of the blood and blood-forming organs and certain disorders involving the immune mechanism: Secondary | ICD-10-CM | POA: Diagnosis not present

## 2020-08-12 DIAGNOSIS — R5382 Chronic fatigue, unspecified: Secondary | ICD-10-CM | POA: Diagnosis not present

## 2020-09-10 ENCOUNTER — Other Ambulatory Visit: Payer: Self-pay | Admitting: Cardiovascular Disease

## 2020-09-13 ENCOUNTER — Other Ambulatory Visit: Payer: Self-pay | Admitting: Cardiovascular Disease

## 2020-09-13 ENCOUNTER — Other Ambulatory Visit: Payer: Self-pay

## 2020-09-13 ENCOUNTER — Ambulatory Visit (INDEPENDENT_AMBULATORY_CARE_PROVIDER_SITE_OTHER): Payer: Medicare HMO

## 2020-09-13 DIAGNOSIS — I10 Essential (primary) hypertension: Secondary | ICD-10-CM | POA: Diagnosis not present

## 2020-09-13 DIAGNOSIS — I7101 Dissection of thoracic aorta: Secondary | ICD-10-CM | POA: Diagnosis not present

## 2020-09-13 DIAGNOSIS — I4821 Permanent atrial fibrillation: Secondary | ICD-10-CM | POA: Diagnosis not present

## 2020-09-13 DIAGNOSIS — I71019 Dissection of thoracic aorta, unspecified: Secondary | ICD-10-CM

## 2020-09-13 DIAGNOSIS — E7800 Pure hypercholesterolemia, unspecified: Secondary | ICD-10-CM | POA: Diagnosis not present

## 2020-09-13 DIAGNOSIS — T148XXA Other injury of unspecified body region, initial encounter: Secondary | ICD-10-CM | POA: Diagnosis not present

## 2020-09-13 LAB — ECHOCARDIOGRAM COMPLETE
Area-P 1/2: 2.69 cm2
S' Lateral: 2.4 cm

## 2020-09-13 NOTE — Progress Notes (Signed)
Complete echocardiogram performed.  Jimmy Deema Juncaj RDCS, RVT  

## 2020-09-15 ENCOUNTER — Telehealth: Payer: Self-pay | Admitting: Cardiology

## 2020-09-15 NOTE — Telephone Encounter (Signed)
Jerome Sullivan is returning Jerome Sullivan's call in regards to her husband's echo results. Please advise.

## 2020-09-15 NOTE — Telephone Encounter (Signed)
Patient wife informed of results per dpr.

## 2020-09-22 ENCOUNTER — Telehealth: Payer: Self-pay | Admitting: Cardiology

## 2020-09-22 ENCOUNTER — Encounter: Payer: Self-pay | Admitting: Emergency Medicine

## 2020-09-22 DIAGNOSIS — Z79899 Other long term (current) drug therapy: Secondary | ICD-10-CM

## 2020-09-22 NOTE — Telephone Encounter (Signed)
Patient's wife was calling to get a refill on Chlorthalidone but explain I dont see this on his list of medication. Patient stated that Dr. Cathie Olden prescribe to her husband. Please advice

## 2020-09-22 NOTE — Telephone Encounter (Signed)
That is fine, lets give her refill for chlorthalidone, however I want him to have Chem-7 done within the next week or so

## 2020-09-23 NOTE — Telephone Encounter (Signed)
Please ask him what was the dose of it and given the same dose.

## 2020-09-23 NOTE — Telephone Encounter (Signed)
Left message for patient to return call.

## 2020-09-23 NOTE — Telephone Encounter (Signed)
Patient was returning phone call 

## 2020-09-24 ENCOUNTER — Telehealth: Payer: Self-pay | Admitting: Cardiology

## 2020-09-24 MED ORDER — CHLORTHALIDONE 25 MG PO TABS: 25.0000 mg | ORAL_TABLET | Freq: Every day | ORAL | 1 refills | Status: DC

## 2020-09-24 NOTE — Telephone Encounter (Signed)
Follow up:    Patient wife returning a call back concering a refill.

## 2020-09-24 NOTE — Addendum Note (Signed)
Addended by: Senaida Ores on: 09/24/2020 01:59 PM   Modules accepted: Orders

## 2020-09-24 NOTE — Telephone Encounter (Signed)
Left message for patient to return call.

## 2020-09-24 NOTE — Telephone Encounter (Signed)
  Patient came in office and reports that he is taking 25 mg of chlorthalidone will send it in. He will have labs drawn in 1 week.

## 2020-09-24 NOTE — Telephone Encounter (Signed)
Called and spoke to patient wife per dpr. She does not have dose right now she is going to call back. Informed her he needs labs drawn in 1 week. She understood. No further questions.

## 2020-09-24 NOTE — Telephone Encounter (Signed)
See additional call

## 2020-10-04 DIAGNOSIS — M792 Neuralgia and neuritis, unspecified: Secondary | ICD-10-CM | POA: Diagnosis not present

## 2020-10-04 DIAGNOSIS — D6869 Other thrombophilia: Secondary | ICD-10-CM | POA: Diagnosis not present

## 2020-10-04 DIAGNOSIS — I70213 Atherosclerosis of native arteries of extremities with intermittent claudication, bilateral legs: Secondary | ICD-10-CM | POA: Diagnosis not present

## 2020-10-04 DIAGNOSIS — Z1331 Encounter for screening for depression: Secondary | ICD-10-CM | POA: Diagnosis not present

## 2020-10-04 DIAGNOSIS — Z79899 Other long term (current) drug therapy: Secondary | ICD-10-CM | POA: Diagnosis not present

## 2020-10-04 DIAGNOSIS — D693 Immune thrombocytopenic purpura: Secondary | ICD-10-CM | POA: Diagnosis not present

## 2020-10-04 DIAGNOSIS — I482 Chronic atrial fibrillation, unspecified: Secondary | ICD-10-CM | POA: Diagnosis not present

## 2020-10-04 DIAGNOSIS — I7103 Dissection of thoracoabdominal aorta: Secondary | ICD-10-CM | POA: Diagnosis not present

## 2020-10-04 DIAGNOSIS — M0579 Rheumatoid arthritis with rheumatoid factor of multiple sites without organ or systems involvement: Secondary | ICD-10-CM | POA: Diagnosis not present

## 2020-10-04 DIAGNOSIS — Z9181 History of falling: Secondary | ICD-10-CM | POA: Diagnosis not present

## 2020-10-04 LAB — BASIC METABOLIC PANEL
BUN/Creatinine Ratio: 15 (ref 10–24)
BUN: 20 mg/dL (ref 8–27)
CO2: 29 mmol/L (ref 20–29)
Calcium: 9.9 mg/dL (ref 8.6–10.2)
Chloride: 97 mmol/L (ref 96–106)
Creatinine, Ser: 1.31 mg/dL — ABNORMAL HIGH (ref 0.76–1.27)
Glucose: 127 mg/dL — ABNORMAL HIGH (ref 65–99)
Potassium: 3.7 mmol/L (ref 3.5–5.2)
Sodium: 142 mmol/L (ref 134–144)
eGFR: 56 mL/min/{1.73_m2} — ABNORMAL LOW (ref >59)

## 2020-10-07 ENCOUNTER — Telehealth: Payer: Self-pay

## 2020-10-07 DIAGNOSIS — I71019 Dissection of thoracic aorta, unspecified: Secondary | ICD-10-CM

## 2020-10-07 DIAGNOSIS — I7101 Dissection of thoracic aorta: Secondary | ICD-10-CM

## 2020-10-07 NOTE — Telephone Encounter (Signed)
(  Ok per [employer]), spoke with Mr. Garlitz wife, results notified.

## 2020-10-07 NOTE — Telephone Encounter (Signed)
-----   Message from Park Liter, MD sent at 10/06/2020  1:12 PM EDT ----- Creatinine minimally elevated still okay to do CT

## 2020-10-12 NOTE — Telephone Encounter (Signed)
Jerome Sullivan is returning Jerome Sullivan's call in regards to her husbands results. Please advise.

## 2020-10-27 DIAGNOSIS — M792 Neuralgia and neuritis, unspecified: Secondary | ICD-10-CM | POA: Diagnosis not present

## 2020-10-27 DIAGNOSIS — Z6826 Body mass index (BMI) 26.0-26.9, adult: Secondary | ICD-10-CM | POA: Diagnosis not present

## 2020-10-27 DIAGNOSIS — I70213 Atherosclerosis of native arteries of extremities with intermittent claudication, bilateral legs: Secondary | ICD-10-CM | POA: Diagnosis not present

## 2020-10-28 ENCOUNTER — Other Ambulatory Visit: Payer: Self-pay | Admitting: Cardiovascular Disease

## 2020-10-28 ENCOUNTER — Other Ambulatory Visit: Payer: Self-pay

## 2020-10-28 NOTE — Telephone Encounter (Signed)
This is a [facility] pt °

## 2020-10-28 NOTE — Telephone Encounter (Signed)
28m, 89.8kg, scr 1.31 10/04/20, lovw/krasowski 06/07/20, ccr 60

## 2020-10-28 NOTE — Telephone Encounter (Signed)
Left message for patient to return call.

## 2020-10-28 NOTE — Telephone Encounter (Signed)
Patient's wife is returning call. 

## 2020-10-29 NOTE — Addendum Note (Signed)
Addended by: Senaida Ores on: 10/29/2020 09:00 AM   Modules accepted: Orders

## 2020-10-29 NOTE — Telephone Encounter (Signed)
Left message for patient to return call.

## 2020-10-29 NOTE — Telephone Encounter (Signed)
Spoke to patient wife per dpr she is ok with them being done at [facility] [place] will send for precert.

## 2020-11-01 ENCOUNTER — Other Ambulatory Visit: Payer: Self-pay

## 2020-11-02 ENCOUNTER — Encounter (HOSPITAL_BASED_OUTPATIENT_CLINIC_OR_DEPARTMENT_OTHER): Payer: Self-pay

## 2020-11-02 ENCOUNTER — Other Ambulatory Visit: Payer: Self-pay

## 2020-11-02 ENCOUNTER — Ambulatory Visit (HOSPITAL_BASED_OUTPATIENT_CLINIC_OR_DEPARTMENT_OTHER)
Admission: RE | Admit: 2020-11-02 | Discharge: 2020-11-02 | Disposition: A | Discharge status: Home / Self Care | Payer: Medicare HMO | Source: Ambulatory Visit | Attending: Cardiology | Admitting: Cardiology

## 2020-11-02 DIAGNOSIS — I7101 Dissection of thoracic aorta: Secondary | ICD-10-CM | POA: Insufficient documentation

## 2020-11-02 DIAGNOSIS — I712 Thoracic aortic aneurysm, without rupture: Secondary | ICD-10-CM | POA: Diagnosis not present

## 2020-11-02 DIAGNOSIS — I71019 Dissection of thoracic aorta, unspecified: Secondary | ICD-10-CM

## 2020-11-02 DIAGNOSIS — I714 Abdominal aortic aneurysm, without rupture: Secondary | ICD-10-CM | POA: Diagnosis not present

## 2020-11-02 MED ORDER — IOHEXOL 350 MG/ML SOLN
100.0000 mL | Freq: Once | INTRAVENOUS | Status: AC | PRN
  Administered 2020-11-02: 100 mL via INTRAVENOUS

## 2020-11-05 ENCOUNTER — Encounter: Payer: Self-pay | Admitting: Cardiology

## 2020-11-05 ENCOUNTER — Ambulatory Visit: Payer: Medicare HMO | Admitting: Cardiology

## 2020-11-05 ENCOUNTER — Other Ambulatory Visit: Payer: Self-pay

## 2020-11-05 VITALS — BP 92/66 | HR 52 | Ht 72.0 in | Wt 195.0 lb

## 2020-11-05 DIAGNOSIS — I7101 Dissection of thoracic aorta: Secondary | ICD-10-CM | POA: Diagnosis not present

## 2020-11-05 DIAGNOSIS — T148XXA Other injury of unspecified body region, initial encounter: Secondary | ICD-10-CM

## 2020-11-05 DIAGNOSIS — I71019 Dissection of thoracic aorta, unspecified: Secondary | ICD-10-CM

## 2020-11-05 DIAGNOSIS — T148XXD Other injury of unspecified body region, subsequent encounter: Secondary | ICD-10-CM | POA: Diagnosis not present

## 2020-11-05 DIAGNOSIS — E7800 Pure hypercholesterolemia, unspecified: Secondary | ICD-10-CM

## 2020-11-05 DIAGNOSIS — I4821 Permanent atrial fibrillation: Secondary | ICD-10-CM

## 2020-11-05 NOTE — Progress Notes (Signed)
Cardiology Office Note:    Date:  11/05/2020   ID:  Jerome So Clifford., DOB 1940-07-06, MRN [id number]  PCP:  Street, Sharon Mt, MD  Cardiologist:  Jenne Campus, MD    Referring MD: Street, Sharon Mt, *   Chief Complaint  Patient presents with  . Results  Am doing fine  History of Present Illness:    Jerome Woolbright. is a 80 y.o. male with past medical history significant for permanent atrial fibrillation, he is anticoagulant Xarelto, type II aortic dissection with intramural hematoma as well as penetrating ulcer of the abdominal artery followed by vascular surgery in [place].  Essential hypertension dyslipidemia. Comes today to my office for follow-up overall doing very well.  Denies have any chest pain tightness squeezing pressure burning chest.  Still very active working the garden have no difficulty doing it.  Is been complaining of having some neuropathic pain in the right lower extremities and he was given some medication for it which make him very sleepy however dose being cut in half and he is doing much better.  Denies have any palpitation dizziness passing out.  No chest pain tightness squeezing pressure burning chest no abdominal pain.  Past Medical History:  Diagnosis Date  . A-fib ([place])   . A-fib [facility])    failed cardioversion  . Aortic aneurysm ([place]) 08/17/2017  . Aortic dissection distal to left subclavian ([place]) 08/17/2017  . Atrial fibrillation ([place]) 09/15/2010  . HTN (hypertension)   . Hypercholesteremia   . Hypertension 09/15/2010  . Intramural hematoma of descending thoracic and abdominal aorta 08/17/2017    Past Surgical History:  Procedure Laterality Date  . BACK SURGERY    . CARDIOVERSION    . HAND SURGERY    . HAND SURGERY Right   . LASIK    . LUMBAR DISC SURGERY      Current Medications: Current Meds  Medication Sig  . amitriptyline (ELAVIL) 25 MG tablet Take 12.5 mg by mouth at bedtime.  . calcium carbonate (OS-CAL) 600 MG TABS  Take 600 mg by mouth 2 (two) times daily.  . chlorthalidone (HYGROTON) 25 MG tablet Take 1 tablet (25 mg total) by mouth daily.  . Coenzyme Q10 (CO Q 10 PO) Take 1 tablet by mouth 2 (two) times daily.  Marland Kitchen diltiazem (CARDIZEM CD) 120 MG 24 hr capsule Take 1 capsule (120 mg total) by mouth daily.  . folic acid (FOLVITE) 1 MG tablet Take 1 mg by mouth daily.  Marland Kitchen labetalol (NORMODYNE) 200 MG tablet Take 1 tablet (200 mg total) by mouth 2 (two) times daily.  . methotrexate (RHEUMATREX) 2.5 MG tablet Take 1 tablet by mouth once a week.  . montelukast (SINGULAIR) 10 MG tablet Take 1 tablet by mouth daily.  . potassium chloride SA (KLOR-CON) 20 MEQ tablet Take 20 mEq by mouth 2 (two) times daily.  . rosuvastatin (CRESTOR) 20 MG tablet Take 20 mg by mouth daily.  Alveda Reasons 20 MG TABS tablet TAKE ONE (1) TABLET BY MOUTH EVERY DAY (Patient taking differently: Take 20 mg by mouth daily with supper.)     Allergies:   Patient has no known allergies.   Social History   Socioeconomic History  . Marital status: Married    Spouse name: Not on file  . Number of children: Not on file  . Years of education: Not on file  . Highest education level: Not on file  Occupational History  . Not on file  Tobacco Use  .  Smoking status: Former Smoker    Types: Cigarettes    Quit date: 06/26/1978    Years since quitting: 42.3  . Smokeless tobacco: Never Used  Vaping Use  . Vaping Use: Never used  Substance and Sexual Activity  . Alcohol use: No  . Drug use: No  . Sexual activity: Yes    Partners: Female    Birth control/protection: None  Other Topics Concern  . Not on file  Social History Narrative   ** Merged History Encounter **       Social Determinants of Health   Financial Resource Strain: Not on file  Food Insecurity: Not on file  Transportation Needs: Not on file  Physical Activity: Not on file  Stress: Not on file  Social Connections: Not on file     Family History: The patient's family  history includes Atrial fibrillation in his child; Coronary artery disease in his mother; Heart disease in his father and mother; Hypertension in his mother and sister; Stroke in his mother. ROS:   Please see the history of present illness.    All 14 point review of systems negative except as described per history of present illness  EKGs/Labs/Other Studies Reviewed:      Recent Labs: 10/04/2020: BUN 20; Creatinine, Ser 1.31; Potassium 3.7; Sodium 142  Recent Lipid Panel    Component Value Date/Time   CHOL 141 08/29/2019 1034   TRIG 69 08/29/2019 1034   HDL 53 08/29/2019 1034   CHOLHDL 2.7 08/29/2019 1034   CHOLHDL 5.1 08/18/2017 0333   VLDL 32 08/18/2017 0333   LDLCALC 74 08/29/2019 1034    Physical Exam:    VS:  BP 92/66 (BP Location: Left Arm, Patient Position: Sitting)   Pulse (!) 52   Ht 6' (1.829 m)   Wt 195 lb (88.5 kg)   SpO2 95%   BMI 26.45 kg/m     Wt Readings from Last 3 Encounters:  11/05/20 195 lb (88.5 kg)  06/07/20 198 lb (89.8 kg)  08/29/19 186 lb 12.8 oz (84.7 kg)     GEN:  Well nourished, well developed in no acute distress HEENT: Normal NECK: No JVD; No carotid bruits LYMPHATICS: No lymphadenopathy CARDIAC: Irregular irregular, no murmurs, no rubs, no gallops RESPIRATORY:  Clear to auscultation without rales, wheezing or rhonchi  ABDOMEN: Soft, non-tender, non-distended MUSCULOSKELETAL:  No edema; No deformity  SKIN: Warm and dry LOWER EXTREMITIES: no swelling NEUROLOGIC:  Alert and oriented x 3 PSYCHIATRIC:  Normal affect   ASSESSMENT:    1. Permanent atrial fibrillation ([place])   2. Aortic dissection distal to left subclavian ([facility])   3. Hypercholesteremia   4. Intramural hematoma of descending thoracic and abdominal aorta    PLAN:    In order of problems listed above:  1. Permanent atrial fibrillation anticoagulated rate control actually is a little bit slow.  However he tells me when he check it at home is always about 70-80.  We will  continue present management. 2. Type B aortic dissection with penetrating ulcer.  His blood pressure is well controlled actually on the lower side but at the same time he is doing quite well therefore I will not alter any of his medication in the future if heart rate bradycardia and blood pressure being low become an issue we can cut down his diltiazem. 3. Dyslipidemia I did review his K PN which show me data from November last year with LDL of 74 HDL 36.  We will continue present management  which include Crestor 20 mg daily which is high intensity statin. 4. I did review echocardiogram with him which showed preserved left ventricle ejection fraction of course biatrial enlargement is present which is related to atrial fibrillation which is permanent.  We did have failed cardioversions before and we decided to pursue rate control strategy.   Medication Adjustments/Labs and Tests Ordered: Current medicines are reviewed at length with the patient today.  Concerns regarding medicines are outlined above.  Orders Placed This Encounter  Procedures  . EKG 12-Lead   Medication changes: No orders of the defined types were placed in this encounter.   Signed, Jerome Liter, MD, [facility] 11/05/2020 8:55 AM    [place]

## 2020-11-05 NOTE — Patient Instructions (Signed)

## 2020-11-16 DIAGNOSIS — M0579 Rheumatoid arthritis with rheumatoid factor of multiple sites without organ or systems involvement: Secondary | ICD-10-CM | POA: Diagnosis not present

## 2020-12-21 DIAGNOSIS — I1310 Hypertensive heart and chronic kidney disease without heart failure, with stage 1 through stage 4 chronic kidney disease, or unspecified chronic kidney disease: Secondary | ICD-10-CM | POA: Diagnosis not present

## 2020-12-21 DIAGNOSIS — Z79899 Other long term (current) drug therapy: Secondary | ICD-10-CM | POA: Diagnosis not present

## 2020-12-21 DIAGNOSIS — E8881 Metabolic syndrome: Secondary | ICD-10-CM | POA: Diagnosis not present

## 2020-12-21 DIAGNOSIS — D693 Immune thrombocytopenic purpura: Secondary | ICD-10-CM | POA: Diagnosis not present

## 2020-12-21 DIAGNOSIS — Z0000 Encounter for general adult medical examination without abnormal findings: Secondary | ICD-10-CM | POA: Diagnosis not present

## 2020-12-21 DIAGNOSIS — N1831 Chronic kidney disease, stage 3a: Secondary | ICD-10-CM | POA: Diagnosis not present

## 2020-12-21 DIAGNOSIS — S61215A Laceration without foreign body of left ring finger without damage to nail, initial encounter: Secondary | ICD-10-CM | POA: Diagnosis not present

## 2020-12-21 DIAGNOSIS — D6869 Other thrombophilia: Secondary | ICD-10-CM | POA: Diagnosis not present

## 2020-12-21 DIAGNOSIS — E785 Hyperlipidemia, unspecified: Secondary | ICD-10-CM | POA: Diagnosis not present

## 2020-12-21 DIAGNOSIS — I482 Chronic atrial fibrillation, unspecified: Secondary | ICD-10-CM | POA: Diagnosis not present

## 2021-02-07 DIAGNOSIS — M10072 Idiopathic gout, left ankle and foot: Secondary | ICD-10-CM | POA: Diagnosis not present

## 2021-02-14 DIAGNOSIS — Z6827 Body mass index (BMI) 27.0-27.9, adult: Secondary | ICD-10-CM | POA: Diagnosis not present

## 2021-02-14 DIAGNOSIS — M792 Neuralgia and neuritis, unspecified: Secondary | ICD-10-CM | POA: Diagnosis not present

## 2021-02-14 DIAGNOSIS — E663 Overweight: Secondary | ICD-10-CM | POA: Diagnosis not present

## 2021-02-14 DIAGNOSIS — M2550 Pain in unspecified joint: Secondary | ICD-10-CM | POA: Diagnosis not present

## 2021-02-14 DIAGNOSIS — R5382 Chronic fatigue, unspecified: Secondary | ICD-10-CM | POA: Diagnosis not present

## 2021-02-14 DIAGNOSIS — M0579 Rheumatoid arthritis with rheumatoid factor of multiple sites without organ or systems involvement: Secondary | ICD-10-CM | POA: Diagnosis not present

## 2021-02-14 DIAGNOSIS — Z862 Personal history of diseases of the blood and blood-forming organs and certain disorders involving the immune mechanism: Secondary | ICD-10-CM | POA: Diagnosis not present

## 2021-03-14 ENCOUNTER — Other Ambulatory Visit: Payer: Self-pay | Admitting: Cardiology

## 2021-03-31 NOTE — Progress Notes (Signed)
Jerome Sullivan  [address] [place],  [place]  [address] [phone]  Clinic Day:  04/06/2021  Referring physician: Venetia Maxon, Sharon Mt, *  This document serves as a record of services personally performed by Jerome Poisson, MD. It was created on their behalf by Curry,Lauren E, a trained medical scribe. The creation of this record is based on the scribe's personal observations and the provider's statements to them.  CHIEF COMPLAINT:  CC: Immune thrombocytopenic purpura.  Current Treatment:  Surveillance   HISTORY OF PRESENT ILLNESS:  Jerome Sullivan. is a 80 y.o. male who we began seeing in February 2019 for severe thrombocytopenia. This was discovered when he presented to his primary care office with a severe gastrointestinal illness and was found to have platelet count of 18,000. He also had mild neutropenia with total white count of 3.2 and an [place] of 1570. He had mild hyperbilirubinemia with a bilirubin of 1.4, as well as hypokalemia with a potassium 3.1.  Further evaluation revealed normal B12, folate and LDH.  Fibrinogen was mildly elevated, which was inconsistent with TTP or DIC.  Serum protein electrophoresis was normal.  He was felt to have immune thrombocytopenic purpura (ITP) and was placed on high-dose prednisone 60 mg daily.  He has atrial fibrillation, for which he was on rivaroxaban 20mg  daily, but this was placed on hold due to the severe thrombocytopenia.  He also was on methotrexate for rheumatoid arthritis and this was placed on hold during this episode of ITP as well.  He had rapid recovery of his platelets, so we began to taper the prednisone.  He was hospitalized in late February at [facility] after being transferred from [facility] emergency department when he was found to have an aortic aneurysm with possible dissection after presenting with severe abdominal and back pain.  He was seen by the vascular surgeon and because this  was a type B thoracic aortic dissection, medical treatment was recommended, so he was started on diltiazem 120 mg daily and labetalol 200 mg twice a day for adequate blood pressure control.  During his hospitalization the platelet count was as low as 45,000.  We were monitoring his platelet count weekly and decreasing the prednisone.  Prednisone was discontinued in June 2019. The platelet count has remained normal.  He was placed back on  methotrexate 2.5 mg, 4 pills per week.   He developed right flank and upper abdominal pain in early June 2019.  CT angiogram of abdomen and pelvis was done to re-evaluate his aortic aneurysm.  Unfortunately, this was suspicious for right lower lobe pulmonary embolism.  There was resolution of the intramural hematoma within the proximal descending thoracic aorta.  The ectasia of the abdominal aorta was stable, but due to this he has increased risk for developing an aneurysm in the area.  He was symptomatic with dyspnea with exertion. CT angiogram chest revealed multiple bilateral pulmonary emboli with right heart strain, so the patient was transferred to the emergency department for further evaluation and treatment.  Bilateral lower extremity venous Doppler ultrasound done in the emergency room revealed an occlusive deep venous thrombosis in the distal left popliteal vein.  He was admitted overnight for observation.  The patient was started back on rivaroxaban and remains on 20 mg daily.    He had an ultrasound of the right lower extremity in October 2019 due to increased swelling and had no further evidence of deep venous thrombosis.  However, there was  a very large complex Baker's cyst measuring 7 cm in the right popliteal fossa.  Methotrexate 2.5 mg was increased to 6 tablets weekly in December 2019 with improvement in his joint pain.  He was hospitalized in January of 2021 with COVID-19.  He had mild thrombocytopenia during the hospitalization with the platelets remaining  above 100,000. He states he had a 29 lb weight loss during his illness.  His methotrexate was held again during his illness in this caused him to have worsening arthritis pain, especially in the right foot.  He was seen by the rheumatologist in February and his methotrexate 2.5 mg was increased to 7 tablets weekly.  When he was seen in 2021, his potassium was extremely low at 2.9 and his supplement was increased.   INTERVAL HISTORY:  Jerome Sullivan is here for annual follow up and states that he has been well and denies complaints other than occasional back pain. Blood counts and chemistries are unremarkable including a stable platelet count of 180,000. Potassium is borderline normal at 3.5. He knows to include potassium rich foods in his diet. His  appetite is good, and he has gained 1 and 1/2 pounds since his last visit.  He denies fever, chills or other signs of infection.  He denies nausea, vomiting, bowel issues, or abdominal pain.  He denies sore throat, cough, dyspnea, or chest pain.  REVIEW OF SYSTEMS:  Review of Systems  Constitutional: Negative.  Negative for appetite change, chills, fatigue, fever and unexpected weight change.  HENT:  Negative.    Eyes: Negative.   Respiratory: Negative.  Negative for chest tightness, cough, hemoptysis, shortness of breath and wheezing.   Cardiovascular: Negative.  Negative for chest pain, leg swelling and palpitations.  Gastrointestinal: Negative.  Negative for abdominal distention, abdominal pain, blood in stool, constipation, diarrhea, nausea and vomiting.  Endocrine: Negative.   Genitourinary: Negative.  Negative for difficulty urinating, dysuria, frequency and hematuria.   Musculoskeletal:  Positive for back pain (occasional). Negative for arthralgias, flank pain, gait problem and myalgias.  Skin: Negative.   Neurological: Negative.  Negative for dizziness, extremity weakness, gait problem, headaches, light-headedness, numbness, seizures and speech difficulty.   Hematological: Negative.   Psychiatric/Behavioral: Negative.  Negative for depression and sleep disturbance. The patient is not nervous/anxious.   All other systems reviewed and are negative.   VITALS:  Blood pressure 114/65, pulse (!) 59, temperature 98 F (36.7 C), temperature source Oral, resp. rate 18, height 6' (1.829 m), weight 196 lb 9.6 oz (89.2 kg), SpO2 97 %.  Wt Readings from Last 3 Encounters:  04/06/21 196 lb 9.6 oz (89.2 kg)  11/05/20 195 lb (88.5 kg)  06/07/20 198 lb (89.8 kg)    Body mass index is 26.66 kg/m.  Performance status (ECOG): 0 - Asymptomatic  PHYSICAL EXAM:  Physical Exam Constitutional:      General: He is not in acute distress.    Appearance: Normal appearance. He is normal weight.  HENT:     Head: Normocephalic and atraumatic.  Eyes:     General: No scleral icterus.    Extraocular Movements: Extraocular movements intact.     Conjunctiva/sclera: Conjunctivae normal.     Pupils: Pupils are equal, round, and reactive to light.  Cardiovascular:     Rate and Rhythm: Regular rhythm. Bradycardia present.     Pulses: Normal pulses.     Heart sounds: Normal heart sounds. No murmur heard.   No friction rub. No gallop.  Pulmonary:     Effort:  Pulmonary effort is normal. No respiratory distress.     Breath sounds: Normal breath sounds.  Abdominal:     General: Bowel sounds are normal. There is no distension.     Palpations: Abdomen is soft. There is no hepatomegaly, splenomegaly or mass.     Tenderness: There is no abdominal tenderness.  Musculoskeletal:        General: Normal range of motion.     Cervical back: Normal range of motion and neck supple.     Right lower leg: No edema.     Left lower leg: No edema.  Lymphadenopathy:     Cervical: No cervical adenopathy.  Skin:    General: Skin is warm and dry.  Neurological:     General: No focal deficit present.     Mental Status: He is alert and oriented to person, place, and time. Mental status  is at baseline.  Psychiatric:        Mood and Affect: Mood normal.        Behavior: Behavior normal.        Thought Content: Thought content normal.        Judgment: Judgment normal.    LABS:   CBC Latest Ref Rng & Units 04/06/2021 08/22/2017 08/21/2017  WBC - 5.3 8.5 11.9(H)  Hemoglobin 13.5 - 17.5 14.8 11.8(L) 12.4(L)  Hematocrit 41 - 53 44 36.3(L) 37.7(L)  Platelets 150 - 399 180 64(L) 48(L)   CMP Latest Ref Rng & Units 04/06/2021 10/04/2020 09/19/2019  Glucose 65 - 99 mg/dL - 127(H) 116(H)  BUN 4 - 21 21 20 16   Creatinine 0.6 - 1.3 1.2 1.31(H) 1.03  Sodium 137 - 147 137 142 140  Potassium 3.4 - 5.3 3.5 3.7 3.5  Chloride 99 - 108 98(A) 97 98  CO2 13 - 22 36(A) 29 28  Calcium 8.7 - 10.7 9.8 9.9 9.5  Total Protein 6.0 - 8.5 g/dL - - -  Total Bilirubin 0.0 - 1.2 mg/dL - - -  Alkaline Phos 25 - 125 36 - -  AST 14 - 40 50(A) - -  ALT 10 - 40 26 - -    Lab Results  Component Value Date   LDH 169 08/21/2017    STUDIES:  No results found.    HISTORY:   Allergies: No Known Allergies  Current Medications: Current Outpatient Medications  Medication Sig Dispense Refill   amitriptyline (ELAVIL) 25 MG tablet Take 12.5 mg by mouth at bedtime.     calcium carbonate (OS-CAL) 600 MG TABS Take 600 mg by mouth 2 (two) times daily.     chlorthalidone (HYGROTON) 25 MG tablet TAKE 1 TABLET BY MOUTH ONCE DAILY 90 tablet 1   Coenzyme Q10 (CO Q 10 PO) Take 1 tablet by mouth 2 (two) times daily.     diltiazem (CARDIZEM CD) 120 MG 24 hr capsule Take 1 capsule (120 mg total) by mouth daily. 30 capsule 1   folic acid (FOLVITE) 1 MG tablet Take 1 mg by mouth daily.     gabapentin (NEURONTIN) 300 MG capsule Take 300 mg by mouth daily.     labetalol (NORMODYNE) 200 MG tablet Take 1 tablet (200 mg total) by mouth 2 (two) times daily. 60 tablet 1   methotrexate (RHEUMATREX) 2.5 MG tablet Take 1 tablet by mouth once a week.     montelukast (SINGULAIR) 10 MG tablet Take 1 tablet by mouth daily.      potassium chloride SA (KLOR-CON) 20 MEQ tablet Take 20  mEq by mouth 2 (two) times daily.     rosuvastatin (CRESTOR) 20 MG tablet Take 20 mg by mouth daily.     XARELTO 20 MG TABS tablet TAKE ONE (1) TABLET BY MOUTH EVERY DAY (Patient taking differently: Take 20 mg by mouth daily with supper.) 90 tablet 1   No current facility-administered medications for this visit.     ASSESSMENT & PLAN:   Assessment:   1. Immune thrombocytopenic purpura.  The thrombocytopenia resolved and he remains in remission.  2. History of type B aortic dissection.  Due to this, his anticoagulation was placed on hold.  3. Multiple bilateral pulmonary emboli with right heart strain, as well as left lower extremity deep venous thrombosis in 2019, so rivaroxaban 20mg  daily was resumed.  4. Atrial fibrillation.  He continues rivaroxaban.  5. Large Baker's cyst right lower extremity.   6. Rheumatoid arthritis, for which he continues methotrexate.  He continues to follow closely with the rheumatologist.    Plan: As he has remained in remission, I will release him back to his primary care physician. He will need CBC checked at least once yearly. I will not schedule him for follow up appointment, but if he develops recurrent thrombocytopenia I would be glad to see him back.  The patient understands the plans discussed today and is in agreement with them.  He knows to contact our office if he develops concerns.   I provided 15 minutes of face-to-face time during this this encounter and > 50% was spent counseling as documented under my assessment and plan.    Derwood Kaplan, MD [facility] [address] [place] [address] Dept: [phone] Dept Fax: [phone]   I, Rita Ohara, am acting as scribe for Derwood Kaplan, MD  I have reviewed this report as typed by the medical scribe, and it is complete and accurate.  Hermina Barters

## 2021-04-06 ENCOUNTER — Other Ambulatory Visit: Payer: Self-pay | Admitting: Oncology

## 2021-04-06 ENCOUNTER — Inpatient Hospital Stay: Payer: Medicare HMO | Attending: Oncology | Admitting: Oncology

## 2021-04-06 ENCOUNTER — Inpatient Hospital Stay: Payer: Medicare HMO

## 2021-04-06 ENCOUNTER — Other Ambulatory Visit: Payer: Self-pay | Admitting: Hematology and Oncology

## 2021-04-06 VITALS — BP 114/65 | HR 59 | Temp 98.0°F | Resp 18 | Ht 72.0 in | Wt 196.6 lb

## 2021-04-06 DIAGNOSIS — M7121 Synovial cyst of popliteal space [Baker], right knee: Secondary | ICD-10-CM

## 2021-04-06 DIAGNOSIS — Z862 Personal history of diseases of the blood and blood-forming organs and certain disorders involving the immune mechanism: Secondary | ICD-10-CM | POA: Diagnosis not present

## 2021-04-06 DIAGNOSIS — I82432 Acute embolism and thrombosis of left popliteal vein: Secondary | ICD-10-CM

## 2021-04-06 DIAGNOSIS — I2699 Other pulmonary embolism without acute cor pulmonale: Secondary | ICD-10-CM

## 2021-04-06 DIAGNOSIS — M069 Rheumatoid arthritis, unspecified: Secondary | ICD-10-CM | POA: Insufficient documentation

## 2021-04-06 DIAGNOSIS — M0579 Rheumatoid arthritis with rheumatoid factor of multiple sites without organ or systems involvement: Secondary | ICD-10-CM

## 2021-04-06 DIAGNOSIS — D696 Thrombocytopenia, unspecified: Secondary | ICD-10-CM | POA: Diagnosis not present

## 2021-04-06 DIAGNOSIS — D649 Anemia, unspecified: Secondary | ICD-10-CM | POA: Diagnosis not present

## 2021-04-06 LAB — HEPATIC FUNCTION PANEL
ALT: 26 (ref 10–40)
AST: 50 — AB (ref 14–40)
Alkaline Phosphatase: 36 (ref 25–125)
Bilirubin, Total: 1.2

## 2021-04-06 LAB — BASIC METABOLIC PANEL
BUN: 21 (ref 4–21)
CO2: 36 — AB (ref 13–22)
Chloride: 98 — AB (ref 99–108)
Creatinine: 1.2 (ref 0.6–1.3)
Glucose: 127
Potassium: 3.5 (ref 3.4–5.3)
Sodium: 137 (ref 137–147)

## 2021-04-06 LAB — CBC: RBC: 5.09 (ref 3.87–5.11)

## 2021-04-06 LAB — CBC AND DIFFERENTIAL
HCT: 44 (ref 41–53)
Hemoglobin: 14.8 (ref 13.5–17.5)
Neutrophils Absolute: 3.6
Platelets: 180 (ref 150–399)
WBC: 5.3

## 2021-04-06 LAB — COMPREHENSIVE METABOLIC PANEL
Albumin: 4.1 (ref 3.5–5.0)
Calcium: 9.8 (ref 8.7–10.7)

## 2021-04-07 ENCOUNTER — Telehealth: Payer: Self-pay | Admitting: Oncology

## 2021-04-07 NOTE — Telephone Encounter (Signed)
Per 10/13 LOS, return if symptoms worsen

## 2021-04-13 DIAGNOSIS — S335XXA Sprain of ligaments of lumbar spine, initial encounter: Secondary | ICD-10-CM | POA: Diagnosis not present

## 2021-04-14 ENCOUNTER — Encounter: Payer: Self-pay | Admitting: Oncology

## 2021-05-02 ENCOUNTER — Other Ambulatory Visit: Payer: Self-pay | Admitting: Cardiology

## 2021-05-03 NOTE — Telephone Encounter (Signed)
Prescription refill request for Xarelto received.  hx: DVT, PE, afib Last office visit: 5/13/20222, [place] Weight: 89.2 kg  Age: 80 yo  Scr: 1.2, 04/06/2021 CrCl: 64 ml/min   Refill sent.

## 2021-05-23 DIAGNOSIS — L578 Other skin changes due to chronic exposure to nonionizing radiation: Secondary | ICD-10-CM | POA: Diagnosis not present

## 2021-05-23 DIAGNOSIS — L570 Actinic keratosis: Secondary | ICD-10-CM | POA: Diagnosis not present

## 2021-05-23 DIAGNOSIS — L821 Other seborrheic keratosis: Secondary | ICD-10-CM | POA: Diagnosis not present

## 2021-06-01 ENCOUNTER — Other Ambulatory Visit: Payer: Self-pay

## 2021-06-01 ENCOUNTER — Encounter: Payer: Self-pay | Admitting: Cardiology

## 2021-06-01 ENCOUNTER — Ambulatory Visit: Payer: Medicare HMO | Admitting: Cardiology

## 2021-06-01 VITALS — BP 110/62 | HR 64 | Ht 71.0 in | Wt 188.8 lb

## 2021-06-01 DIAGNOSIS — E7800 Pure hypercholesterolemia, unspecified: Secondary | ICD-10-CM

## 2021-06-01 DIAGNOSIS — M79609 Pain in unspecified limb: Secondary | ICD-10-CM | POA: Insufficient documentation

## 2021-06-01 DIAGNOSIS — G9332 Myalgic encephalomyelitis/chronic fatigue syndrome: Secondary | ICD-10-CM | POA: Insufficient documentation

## 2021-06-01 DIAGNOSIS — I71019 Dissection of thoracic aorta, unspecified: Secondary | ICD-10-CM | POA: Diagnosis not present

## 2021-06-01 DIAGNOSIS — I4821 Permanent atrial fibrillation: Secondary | ICD-10-CM | POA: Diagnosis not present

## 2021-06-01 DIAGNOSIS — I10 Essential (primary) hypertension: Secondary | ICD-10-CM

## 2021-06-01 DIAGNOSIS — R768 Other specified abnormal immunological findings in serum: Secondary | ICD-10-CM | POA: Insufficient documentation

## 2021-06-01 DIAGNOSIS — M2550 Pain in unspecified joint: Secondary | ICD-10-CM | POA: Insufficient documentation

## 2021-06-01 DIAGNOSIS — Z862 Personal history of diseases of the blood and blood-forming organs and certain disorders involving the immune mechanism: Secondary | ICD-10-CM | POA: Insufficient documentation

## 2021-06-01 DIAGNOSIS — E663 Overweight: Secondary | ICD-10-CM | POA: Insufficient documentation

## 2021-06-01 NOTE — Progress Notes (Signed)
Cardiology Office Note:    Date:  06/01/2021   ID:  Jerome Sullivan., DOB Dec 26, 1940, MRN [id number]  PCP:  Street, Jerome Mt, MD  Cardiologist:  Jerome Campus, MD    Referring MD: Street, Jerome Sullivan, *   Chief Complaint  Patient presents with   Follow-up    History of Present Illness:    Jerome Sullivan. is a 80 y.o. male with past medical history significant for permanent atrial fibrillation, he is anticoagulated, type B aortic dissection with intramural hematoma as well as penetrating ulcer, essential hypertension, dyslipidemia. He comes to my office for regular follow-up.  He is doing very well.  He denies have any chest pain tightness squeezing pressure burning chest.  There is no palpitations no dizziness.  Interestingly few weeks ago he went to urgent care because of back pain and that he was told to be normal rhythm however no EKG was done at that time.  Past Medical History:  Diagnosis Date   A-fib ([place])    A-fib [facility])    failed cardioversion   Aortic aneurysm ([place]) 08/17/2017   Aortic dissection distal to left subclavian 08/17/2017   Atrial fibrillation ([place]) 09/15/2010   HTN (hypertension)    Hypercholesteremia    Hypertension 09/15/2010   Intramural hematoma of descending thoracic and abdominal aorta 08/17/2017    Past Surgical History:  Procedure Laterality Date   BACK SURGERY     CARDIOVERSION     HAND SURGERY     HAND SURGERY Right    LASIK     LUMBAR DISC SURGERY      Current Medications: Current Meds  Medication Sig   amitriptyline (ELAVIL) 25 MG tablet Take 12.5 mg by mouth at bedtime.   calcium carbonate (OS-CAL) 600 MG TABS Take 600 mg by mouth 2 (two) times daily.   chlorthalidone (HYGROTON) 25 MG tablet TAKE 1 TABLET BY MOUTH ONCE DAILY (Patient taking differently: Take 25 mg by mouth daily.)   Coenzyme Q10 (CO Q 10 PO) Take 1 tablet by mouth 2 (two) times daily.   diltiazem (CARDIZEM CD) 120 MG 24 hr capsule Take 1 capsule (120  mg total) by mouth daily.   folic acid (FOLVITE) 1 MG tablet Take 1 mg by mouth daily.   gabapentin (NEURONTIN) 300 MG capsule Take 300 mg by mouth daily.   labetalol (NORMODYNE) 200 MG tablet Take 1 tablet (200 mg total) by mouth 2 (two) times daily.   methotrexate (RHEUMATREX) 2.5 MG tablet Take 1 tablet by mouth once a week.   montelukast (SINGULAIR) 10 MG tablet Take 1 tablet by mouth daily.   potassium chloride SA (KLOR-CON) 20 MEQ tablet Take 20 mEq by mouth 2 (two) times daily.   rosuvastatin (CRESTOR) 20 MG tablet Take 20 mg by mouth daily.   XARELTO 20 MG TABS tablet Take 1 tablet (20 mg total) by mouth daily with supper.     Allergies:   Patient has no known allergies.   Social History   Socioeconomic History   Marital status: Married    Spouse name: Not on file   Number of children: Not on file   Years of education: Not on file   Highest education level: Not on file  Occupational History   Not on file  Tobacco Use   Smoking status: Former    Types: Cigarettes    Quit date: 06/26/1978    Years since quitting: 42.9   Smokeless tobacco: Never  Vaping Use  Vaping Use: Never used  Substance and Sexual Activity   Alcohol use: No   Drug use: No   Sexual activity: Yes    Partners: Female    Birth control/protection: None  Other Topics Concern   Not on file  Social History Narrative   ** Merged History Encounter **       Social Determinants of Health   Financial Resource Strain: Not on file  Food Insecurity: Not on file  Transportation Needs: Not on file  Physical Activity: Not on file  Stress: Not on file  Social Connections: Not on file     Family History: The patient's family history includes Atrial fibrillation in his child; Coronary artery disease in his mother; Heart disease in his father and mother; Hypertension in his mother and sister; Stroke in his mother. ROS:   Please see the history of present illness.    All 14 point review of systems negative  except as described per history of present illness  EKGs/Labs/Other Studies Reviewed:      Recent Labs: 04/06/2021: ALT 26; BUN 21; Creatinine 1.2; Hemoglobin 14.8; Platelets 180; Potassium 3.5; Sodium 137  Recent Lipid Panel    Component Value Date/Time   CHOL 141 08/29/2019 1034   TRIG 69 08/29/2019 1034   HDL 53 08/29/2019 1034   CHOLHDL 2.7 08/29/2019 1034   CHOLHDL 5.1 08/18/2017 0333   VLDL 32 08/18/2017 0333   LDLCALC 74 08/29/2019 1034    Physical Exam:    VS:  BP 110/62 (BP Location: Left Arm, Patient Position: Sitting)   Pulse 64   Ht 5\' 11"  (1.803 m)   Wt 188 lb 12.8 oz (85.6 kg)   SpO2 97%   BMI 26.33 kg/m     Wt Readings from Last 3 Encounters:  06/01/21 188 lb 12.8 oz (85.6 kg)  04/06/21 196 lb 9.6 oz (89.2 kg)  11/05/20 195 lb (88.5 kg)     GEN:  Well nourished, well developed in no acute distress HEENT: Normal NECK: No JVD; No carotid bruits LYMPHATICS: No lymphadenopathy CARDIAC: Irregularly irregular, no murmurs, no rubs, no gallops RESPIRATORY:  Clear to auscultation without rales, wheezing or rhonchi  ABDOMEN: Soft, non-tender, non-distended MUSCULOSKELETAL:  No edema; No deformity  SKIN: Warm and dry LOWER EXTREMITIES: no swelling NEUROLOGIC:  Alert and oriented x 3 PSYCHIATRIC:  Normal affect   ASSESSMENT:    1. Aortic dissection distal to left subclavian   2. Permanent atrial fibrillation ([place])   3. Primary hypertension   4. Hypercholesteremia    PLAN:    In order of problems listed above:  Type B aortic dissection that being followed by cardiothoracic surgeon from [facility].  He does have yearly follow-up.  He does have any pain his blood pressure is excellently controlled. Permanent atrial fibrillation.  Rate control I will repeat EKG today to confirm the rhythm.  It is still irregular on the physical exam therefore I think he is still in atrial fibrillation.  We will continue anticoagulation. Essential hypertension excellently  controlled continue present management. Dyslipidemia I did review K PN which show LDL of 74 HDL 34 and he is already on Crestor 20 which I will continue.   Medication Adjustments/Labs and Tests Ordered: Current medicines are reviewed at length with the patient today.  Concerns regarding medicines are outlined above.  No orders of the defined types were placed in this encounter.  Medication changes: No orders of the defined types were placed in this encounter.   Signed, Herbie Baltimore  Synetta Fail, MD, [facility] Sullivan 06/01/2021 10:32 AM     [facility]

## 2021-06-01 NOTE — Patient Instructions (Signed)

## 2021-06-01 NOTE — Addendum Note (Signed)
Addended by: Senaida Ores on: 06/01/2021 10:44 AM   Modules accepted: Orders

## 2021-06-20 DIAGNOSIS — Z20828 Contact with and (suspected) exposure to other viral communicable diseases: Secondary | ICD-10-CM | POA: Diagnosis not present

## 2021-06-20 DIAGNOSIS — J069 Acute upper respiratory infection, unspecified: Secondary | ICD-10-CM | POA: Diagnosis not present

## 2021-07-06 DIAGNOSIS — S91102A Unspecified open wound of left great toe without damage to nail, initial encounter: Secondary | ICD-10-CM | POA: Diagnosis not present

## 2021-07-06 DIAGNOSIS — M10072 Idiopathic gout, left ankle and foot: Secondary | ICD-10-CM | POA: Diagnosis not present

## 2021-07-25 DIAGNOSIS — M0579 Rheumatoid arthritis with rheumatoid factor of multiple sites without organ or systems involvement: Secondary | ICD-10-CM | POA: Diagnosis not present

## 2021-08-01 DIAGNOSIS — R944 Abnormal results of kidney function studies: Secondary | ICD-10-CM | POA: Diagnosis not present

## 2021-08-12 DIAGNOSIS — D490 Neoplasm of unspecified behavior of digestive system: Secondary | ICD-10-CM | POA: Diagnosis not present

## 2021-08-12 DIAGNOSIS — I10 Essential (primary) hypertension: Secondary | ICD-10-CM | POA: Diagnosis not present

## 2021-08-12 DIAGNOSIS — D3709 Neoplasm of uncertain behavior of other specified sites of the oral cavity: Secondary | ICD-10-CM | POA: Diagnosis not present

## 2021-08-12 DIAGNOSIS — Z7901 Long term (current) use of anticoagulants: Secondary | ICD-10-CM | POA: Diagnosis not present

## 2021-08-12 DIAGNOSIS — H9190 Unspecified hearing loss, unspecified ear: Secondary | ICD-10-CM | POA: Diagnosis not present

## 2021-08-12 DIAGNOSIS — J342 Deviated nasal septum: Secondary | ICD-10-CM | POA: Diagnosis not present

## 2021-08-12 DIAGNOSIS — H61303 Acquired stenosis of external ear canal, unspecified, bilateral: Secondary | ICD-10-CM | POA: Diagnosis not present

## 2021-08-12 DIAGNOSIS — H6123 Impacted cerumen, bilateral: Secondary | ICD-10-CM | POA: Diagnosis not present

## 2021-08-12 DIAGNOSIS — I4891 Unspecified atrial fibrillation: Secondary | ICD-10-CM | POA: Diagnosis not present

## 2021-08-12 DIAGNOSIS — Z87891 Personal history of nicotine dependence: Secondary | ICD-10-CM | POA: Diagnosis not present

## 2021-08-30 ENCOUNTER — Telehealth: Payer: Self-pay

## 2021-08-30 DIAGNOSIS — I700 Atherosclerosis of aorta: Secondary | ICD-10-CM | POA: Insufficient documentation

## 2021-08-30 NOTE — Telephone Encounter (Signed)
Patient with diagnosis of afib, DVT and PE on Xarelto for anticoagulation.   ? ?Procedure: excision of the right posterolateral tongue lesion ?Date of procedure: TBD ? ?CHA2DS2-VASc Score = 4  ?This indicates a 4.8% annual risk of stroke. ?The patient's score is based upon: ?CHF History: 0 ?HTN History: 1 ?Diabetes History: 0 ?Stroke History: 0 ?Vascular Disease History: 1 ?Age Score: 2 ?Gender Score: 0 ?  ?Multiple PE and DVT diagnosed 11/2017. ? ?CrCl 40m/min ?Platelet count 180K ? ?Per office protocol, patient can hold Xarelto for 1-2 days prior to procedure. 5 day DOAC hold not required for any procedure, only warfarin is held that long. ?

## 2021-08-30 NOTE — Telephone Encounter (Signed)
I did s/w the pt's son and asked if he would please have his dad (the pt) call our office, so that we may offer a tele visit with the pre op provider. Pt's son tells me that his mother is in the hospital broken hip and the [phone] cell # is his mom's. The other 101 # son states not valid  for the pt. Pt's son said he will have the pt call for tele appt.  ?

## 2021-08-30 NOTE — Telephone Encounter (Signed)
Clinical pharmacist have made recommendations for Xarelto hold.  Patient needs visit for phone call.  I will remove them from the preoperative pool. ? ?Jossie Ng. Tajanay Hurley NP-C ? ?  ?08/30/2021, 10:22 AM ?[place] ?[address] ?Office [phone] Fax [phone] ? ?

## 2021-08-30 NOTE — Telephone Encounter (Signed)
? ?  Pre-operative Risk Assessment  ?  ?Patient Name: Jerome Sullivan [place].  ?DOB: Nov 08, 1940 ?MRN: [id number]  ? ?  ? ?Request for Surgical Clearance   ? ?Procedure:   excision of the right posterolateral tongue lesion ? ?Date of Surgery:  Clearance TBD                              ?   ?Surgeon:  Dr. Cletis [place] ?Surgeon's Group or Practice Name:  [facility] ?Phone number:  [phone] ?Fax number:  [phone] ?  ?Type of Clearance Requested:   ?- Medical  ?- Pharmacy:  Hold Rivaroxaban (Xarelto) 5 days ?  ?Type of Anesthesia:  General  ?  ?Additional requests/questions:   ? ?Signed, ?Truddie Hidden   ?08/30/2021, 7:54 AM   ?

## 2021-08-30 NOTE — Telephone Encounter (Signed)
Primary Cardiologist:Philip Nahser, MD ? ?Chart reviewed as part of pre-operative protocol coverage. Because of Jerome Jr.'s past medical history and time since last visit, he/she will require a telephone call/virtual visit in order to better assess preoperative cardiovascular risk. ? ?Pre-op covering staff: ?- Please call patient to obtain consent and schedule appointment for telephone call ? ? ?This message will also be routed to pharmacy pool for input on holding anticoagulant agent as requested below so that this information is available at time of patient's appointment.  ? ? ?Emmaline Life, NP-C ? ?  ?08/30/2021, 8:17 AM ?[name] ?[address], [address] ?Office [phone] Fax [phone] ? ?

## 2021-09-02 DIAGNOSIS — M10072 Idiopathic gout, left ankle and foot: Secondary | ICD-10-CM | POA: Diagnosis not present

## 2021-09-02 NOTE — Telephone Encounter (Signed)
I tried to reach the pt x 2 to set up tele pre op appt, vm is full. See previous notes. I will update the requesting office that we are waiting to hear back for the pt.  ?

## 2021-09-08 ENCOUNTER — Telehealth: Payer: Self-pay | Admitting: *Deleted

## 2021-09-08 NOTE — Telephone Encounter (Signed)
I called and s/w DPR pt's wife. I explained to pt's wife pt will need a teel Appt for pre op clearance. She scheduled tele appt for 09/12/21 @ 11:40. Med rec and consent done.  ?

## 2021-09-08 NOTE — Telephone Encounter (Signed)
?  Patient Consent for Virtual Visit  ? ? ?   ? ?Jerome Sullivan [place]. has provided verbal consent on 09/08/2021 for a virtual visit (video or telephone). ? ? ?CONSENT FOR VIRTUAL VISIT FOR:  Jerome Sullivan [place].  ?By participating in this virtual visit I agree to the following: ? ?I hereby voluntarily request, consent and authorize [facility] and its employed or contracted physicians, physician assistants, nurse practitioners or other licensed health care professionals (the Practitioner), to provide me with telemedicine health care services (the ?Services") as deemed necessary by the treating Practitioner. I acknowledge and consent to receive the Services by the Practitioner via telemedicine. I understand that the telemedicine visit will involve communicating with the Practitioner through live audiovisual communication technology and the disclosure of certain medical information by electronic transmission. I acknowledge that I have been given the opportunity to request an in-person assessment or other available alternative prior to the telemedicine visit and am voluntarily participating in the telemedicine visit. ? ?I understand that I have the right to withhold or withdraw my consent to the use of telemedicine in the course of my care at any time, without affecting my right to future care or treatment, and that the Practitioner or I may terminate the telemedicine visit at any time. I understand that I have the right to inspect all information obtained and/or recorded in the course of the telemedicine visit and may receive copies of available information for a reasonable fee.  I understand that some of the potential risks of receiving the Services via telemedicine include:  ?Delay or interruption in medical evaluation due to technological equipment failure or disruption; ?Information transmitted may not be sufficient (e.g. poor resolution of images) to allow for appropriate medical decision making by the  Practitioner; and/or  ?In rare instances, security protocols could fail, causing a breach of personal health information. ? ?Furthermore, I acknowledge that it is my responsibility to provide information about my medical history, conditions and care that is complete and accurate to the best of my ability. I acknowledge that Practitioner's advice, recommendations, and/or decision may be based on factors not within their control, such as incomplete or inaccurate data provided by me or distortions of diagnostic images or specimens that may result from electronic transmissions. I understand that the practice of medicine is not an exact science and that Practitioner makes no warranties or guarantees regarding treatment outcomes. I acknowledge that a copy of this consent can be made available to me via my patient portal ([name]), or I can request a printed copy by calling the office of [name].   ? ?I understand that my insurance will be billed for this visit.  ? ?I have read or had this consent read to me. ?I understand the contents of this consent, which adequately explains the benefits and risks of the Services being provided via telemedicine.  ?I have been provided ample opportunity to ask questions regarding this consent and the Services and have had my questions answered to my satisfaction. ?I give my informed consent for the services to be provided through the use of telemedicine in my medical care ? ? ? ?

## 2021-09-12 ENCOUNTER — Ambulatory Visit (INDEPENDENT_AMBULATORY_CARE_PROVIDER_SITE_OTHER): Payer: Medicare HMO | Admitting: Physician Assistant

## 2021-09-12 ENCOUNTER — Encounter: Payer: Self-pay | Admitting: Physician Assistant

## 2021-09-12 DIAGNOSIS — Z01810 Encounter for preprocedural cardiovascular examination: Secondary | ICD-10-CM | POA: Diagnosis not present

## 2021-09-12 NOTE — Progress Notes (Signed)
? ?Virtual Visit via Telephone Note  ? ?This visit type was conducted due to national recommendations for restrictions regarding the COVID-19 Pandemic (e.g. social distancing) in an effort to limit this patient's exposure and mitigate transmission in our community.  Due to his co-morbid illnesses, this patient is at least at moderate risk for complications without adequate follow up.  This format is felt to be most appropriate for this patient at this time.  The patient did not have access to video technology/had technical difficulties with video requiring transitioning to audio format only (telephone).  All issues noted in this document were discussed and addressed.  No physical exam could be performed with this format.  Please refer to the patient's chart for his  consent to telehealth for [facility]. ?Evaluation Performed:  Preoperative cardiovascular risk assessment ? ?This visit type was conducted due to national recommendations for restrictions regarding the COVID-19 Pandemic (e.g. social distancing).  This format is felt to be most appropriate for this patient at this time.  All issues noted in this document were discussed and addressed.  No physical exam was performed (except for noted visual exam findings with Video Visits).  Please refer to the patient's chart (MyChart message for video visits and phone note for telephone visits) for the patient's consent to telehealth for [facility]. ?_____________  ? ?Date:  09/12/2021  ? ?Patient ID:  Jerome Sullivan., DOB 01-10-1941, MRN [id number] ?Patient Location:  ?Home ?Provider location:   ?Office ? ?Primary Care Provider:  Street, Sharon Mt, MD ?Primary Cardiologist:  Mertie Moores, MD ? ?Chief Complaint  ?  ?81 y.o. y/o male with a h/o permanent Afib on xarelto, type B aortic dissection with intramural hematoma and penetrating ulcer, HTN, and HLD, who is pending excision of the right posterolateral tongue lesion, and presents today for  telephonic preoperative cardiovascular risk assessment. ? ?Past Medical History  ?  ?Past Medical History:  ?Diagnosis Date  ? A-fib ([place])   ? A-fib ([place])   ? failed cardioversion  ? Aortic aneurysm ([place]) 08/17/2017  ? Aortic dissection distal to left subclavian 08/17/2017  ? Atrial fibrillation ([place]) 09/15/2010  ? HTN (hypertension)   ? Hypercholesteremia   ? Hypertension 09/15/2010  ? Intramural hematoma of descending thoracic and abdominal aorta 08/17/2017  ? ?Past Surgical History:  ?Procedure Laterality Date  ? BACK SURGERY    ? CARDIOVERSION    ? HAND SURGERY    ? HAND SURGERY Right   ? LASIK    ? LUMBAR DISC SURGERY    ? ? ?Allergies ? ?No Known Allergies ? ?History of Present Illness  ?  ?Jerome Sullivan. is a 81 y.o. male who presents via audio/video conferencing for a telehealth visit today.  Pt was last seen in cardiology clinic on 06/01/21, by Dr. Agustin Cree.  At that time [facility]. was doing well.  he is now pending excision of the right posterolateral tongue lesion.  Since his last visit, he has done well. He does not have a history of ischemia heart disease. He is able to walk 48mn, climb stairs, grocery shop, and complete heavy house work at home. He hunts regularly.  He is currently caring for his wife who is in rehab. He denies angina.  ? ? ?Home Medications  ?  ?Prior to Admission medications   ?Medication Sig Start Date End Date Taking? Authorizing Provider  ?amitriptyline (ELAVIL) 25 MG tablet Take 12.5 mg by mouth at bedtime.  [provider]  ?calcium carbonate (OS-CAL) 600 MG TABS Take 600 mg by mouth 2 (two) times daily.    [provider]  ?chlorthalidone (HYGROTON) 25 MG tablet TAKE 1 TABLET BY MOUTH ONCE DAILY ?Patient taking differently: Take 25 mg by mouth daily. 03/15/21   Park Liter, MD  ?Coenzyme Q10 (CO Q 10 PO) Take 1 tablet by mouth 2 (two) times daily.    [provider]  ?diltiazem (CARDIZEM CD) 120 MG 24 hr capsule Take 1 capsule  (120 mg total) by mouth daily. 08/23/17   Cherene Altes, MD  ?folic acid (FOLVITE) 1 MG tablet Take 1 mg by mouth daily.    [provider]  ?gabapentin (NEURONTIN) 300 MG capsule Take 300 mg by mouth daily. 01/27/21   [provider]  ?labetalol (NORMODYNE) 200 MG tablet Take 1 tablet (200 mg total) by mouth 2 (two) times daily. 08/22/17   Cherene Altes, MD  ?methotrexate (RHEUMATREX) 2.5 MG tablet Take 1 tablet by mouth once a week. 05/18/20   [provider]  ?montelukast (SINGULAIR) 10 MG tablet Take 1 tablet by mouth daily. ?Patient not taking: Reported on 09/08/2021 07/29/20   [provider]  ?potassium chloride SA (KLOR-CON) 20 MEQ tablet Take 20 mEq by mouth 2 (two) times daily.    [provider]  ?rosuvastatin (CRESTOR) 20 MG tablet Take 20 mg by mouth daily. 08/03/16   [provider]  ?XARELTO 20 MG TABS tablet Take 1 tablet (20 mg total) by mouth daily with supper. 05/03/21   Park Liter, MD  ? ? ?Physical Exam  ?  ?Vital Signs:  [facility]. does not have vital signs available for review today. ? ?Given telephonic nature of communication, physical exam is limited. ?AAOx3. NAD. Normal affect.  Speech and respirations are unlabored. ? ?Accessory Clinical Findings  ?  ?None ? ?Assessment & Plan  ?  ?1.  Preoperative Cardiovascular Risk Assessment: ? ?He does not have a history of ischemic heart disease. He can complete more than 4.0 METS (grocery store, walking, stairs, hunting) without angina. His main complaint is DOE if he walks too quickly, but this has been his baseline. According to the RCRI he is at low risk to proceed. He does have a history of aortic dissection and coronary calcifications seen on CT. He understands he may have unrealized risk for MACE and wishes to proceed.  ? ?Therefore, based on ACC/AHA guidelines, the patient would be at acceptable risk for the planned procedure without further cardiovascular testing.   ? ?The patient was advised that if he develops new symptoms prior to surgery to contact our office to arrange for a follow-up visit, and she verbalized understanding. ? ? ?2. Guidance to hold anticoagulation ?Per our clinical pharmacist:Patient with diagnosis of afib, DVT and PE on Xarelto for anticoagulation.   ?  ?Procedure: excision of the right posterolateral tongue lesion ?Date of procedure: TBD ?  ?CHA2DS2-VASc Score = 4  ?This indicates a 4.8% annual risk of stroke. ?The patient's score is based upon: ?CHF History: 0 ?HTN History: 1 ?Diabetes History: 0 ?Stroke History: 0 ?Vascular Disease History: 1 ?Age Score: 2 ?Gender Score: 0 ?  ?Multiple PE and DVT diagnosed 11/2017. ?  ?CrCl 65m/min ?Platelet count 180K ?  ?Per office protocol, patient can hold Xarelto for 1-2 days prior to procedure. 5 day DOAC hold not required for any procedure, only warfarin is held that long. ? ? ?COVID-19 Education: ?The  signs and symptoms of COVID-19 were discussed with the patient and how to seek care for testing (follow up with PCP or arrange E-visit).  The importance of social distancing was discussed today. ? ?Patient Risk:   ?After full review of this patient's history and clinical status, I feel that he is at least moderate risk for cardiac complications at this time, thus necessitating a telehealth visit sooner than our first available in office visit. ? ?Time:   ?Today, I have spent 10 minutes with the patient with telehealth technology discussing medical history, symptoms, and management plan.   ? ? ?Ledora Bottcher, PA ? ?09/12/2021, 11:38 AM ?

## 2021-09-13 DIAGNOSIS — E790 Hyperuricemia without signs of inflammatory arthritis and tophaceous disease: Secondary | ICD-10-CM | POA: Diagnosis not present

## 2021-09-13 DIAGNOSIS — Z6826 Body mass index (BMI) 26.0-26.9, adult: Secondary | ICD-10-CM | POA: Diagnosis not present

## 2021-09-13 DIAGNOSIS — E663 Overweight: Secondary | ICD-10-CM | POA: Diagnosis not present

## 2021-09-13 DIAGNOSIS — M792 Neuralgia and neuritis, unspecified: Secondary | ICD-10-CM | POA: Diagnosis not present

## 2021-09-13 DIAGNOSIS — R5382 Chronic fatigue, unspecified: Secondary | ICD-10-CM | POA: Diagnosis not present

## 2021-09-13 DIAGNOSIS — Z862 Personal history of diseases of the blood and blood-forming organs and certain disorders involving the immune mechanism: Secondary | ICD-10-CM | POA: Diagnosis not present

## 2021-09-13 DIAGNOSIS — M2550 Pain in unspecified joint: Secondary | ICD-10-CM | POA: Diagnosis not present

## 2021-09-13 DIAGNOSIS — M0579 Rheumatoid arthritis with rheumatoid factor of multiple sites without organ or systems involvement: Secondary | ICD-10-CM | POA: Diagnosis not present

## 2021-09-14 DIAGNOSIS — J209 Acute bronchitis, unspecified: Secondary | ICD-10-CM | POA: Diagnosis not present

## 2021-09-14 DIAGNOSIS — J029 Acute pharyngitis, unspecified: Secondary | ICD-10-CM | POA: Diagnosis not present

## 2021-09-14 DIAGNOSIS — Z20828 Contact with and (suspected) exposure to other viral communicable diseases: Secondary | ICD-10-CM | POA: Diagnosis not present

## 2021-09-14 DIAGNOSIS — R051 Acute cough: Secondary | ICD-10-CM | POA: Diagnosis not present

## 2021-09-26 ENCOUNTER — Other Ambulatory Visit: Payer: Self-pay | Admitting: Cardiology

## 2021-10-03 IMAGING — CT CT ANGIO CHEST
2 of 9 series · 10 of 46 positions shown · non-contrast
Comparison: CT the chest, abdomen pelvis-06/09/2019; 09/28/2017

Chest CT-07/19/2019

CLINICAL DATA: History of thoracic and abdominal aortic aneurysms.

EXAM:
CT ANGIOGRAPHY CHEST, ABDOMEN AND PELVIS
TECHNIQUE: Non-contrast CT of the chest was initially obtained.

[Series 5: axial arterial · axial · arterial · 0.91mm/px · z∈[-610,-37]mm · 7 of 243 slices shown]
[im 26/243  lung]
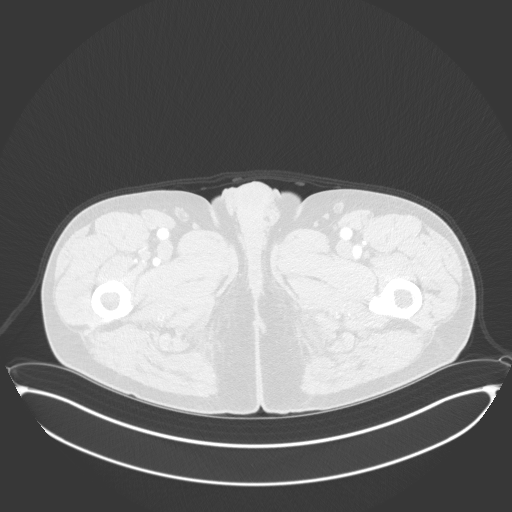
[im 51/243  soft-tissue]
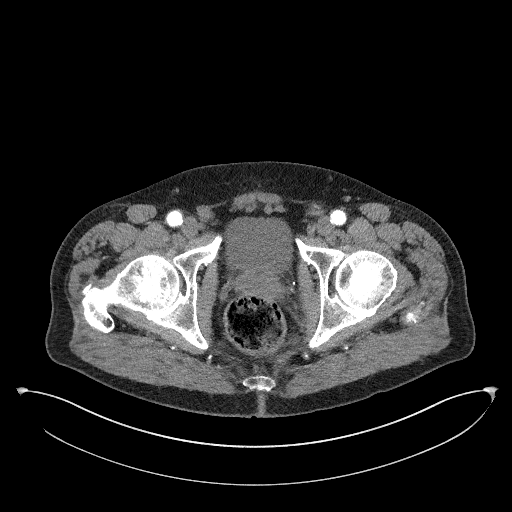
[im 90/243  lung]
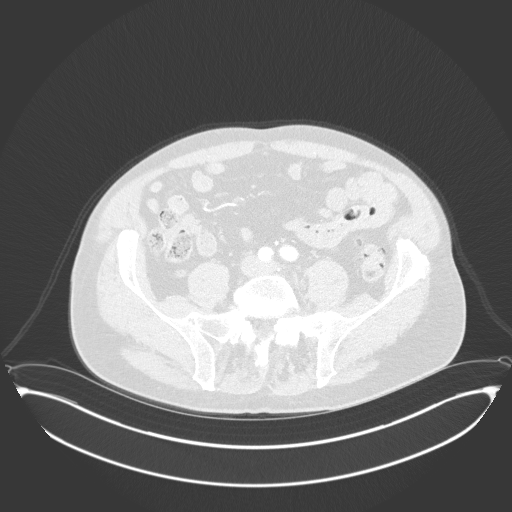
[im 128/243  soft-tissue]
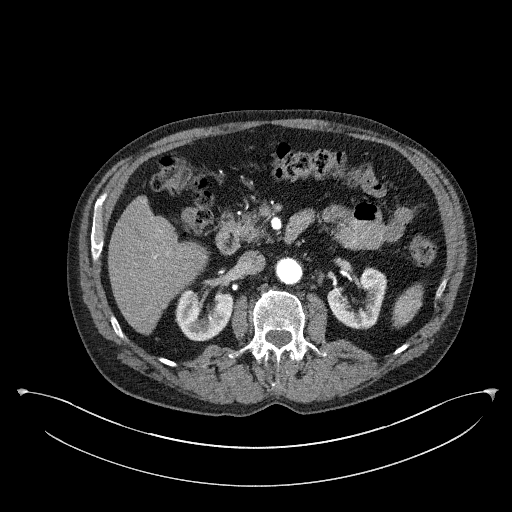
[im 153/243  lung]
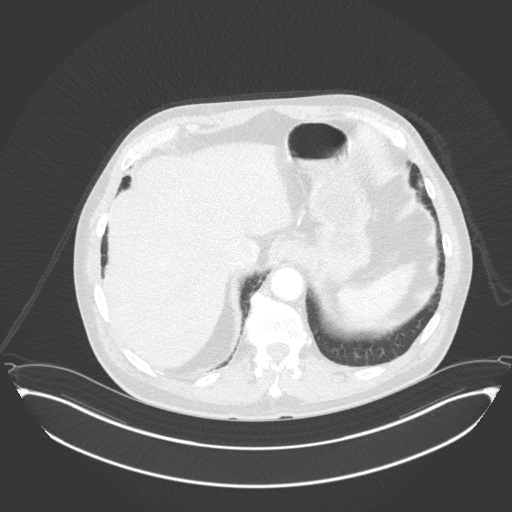
[im 192/243  soft-tissue]
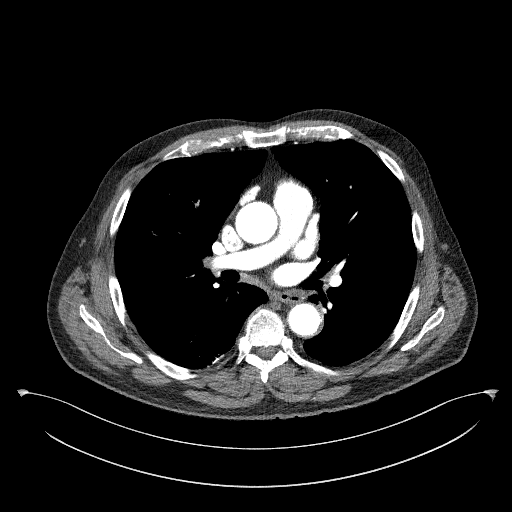
[im 217/243  lung]
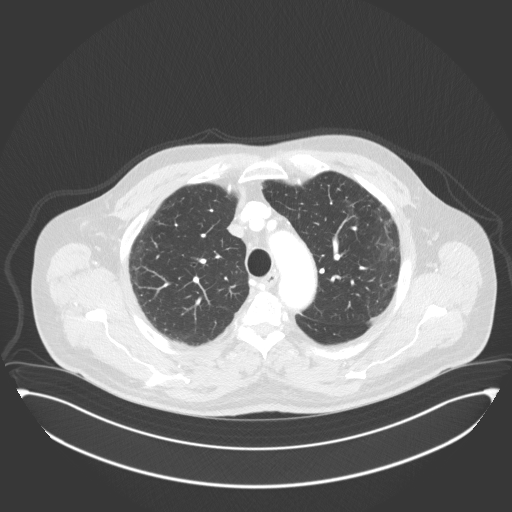

[Series 8: coronals · coronal · 0.85mm/px · 3 of 151 slices shown]
[im 38/151  soft-tissue]
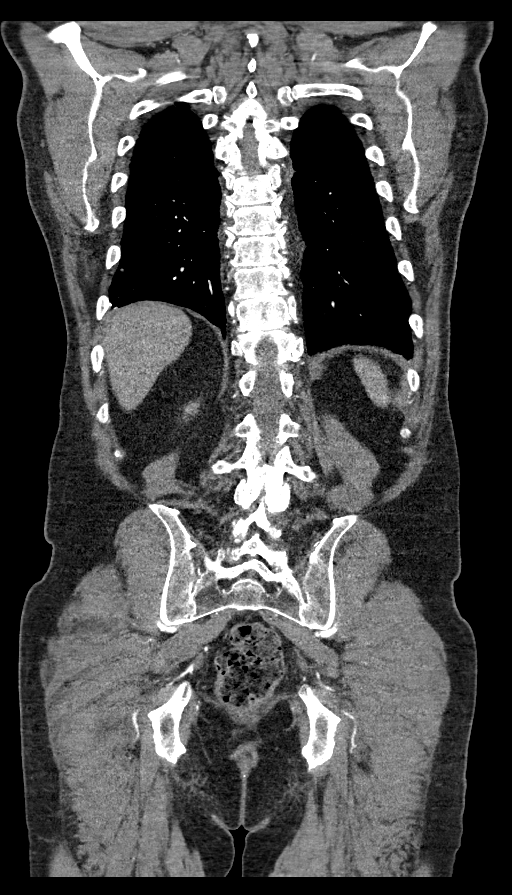
[im 76/151  soft-tissue]
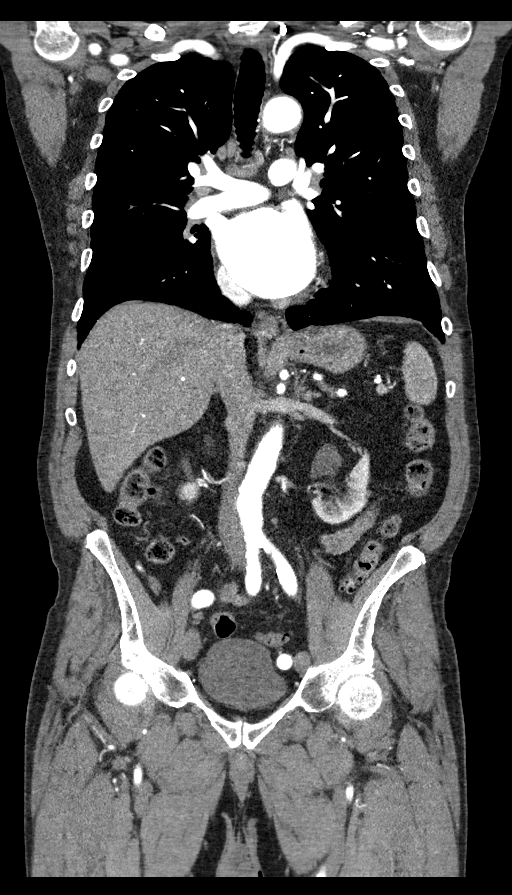
[im 113/151  soft-tissue]
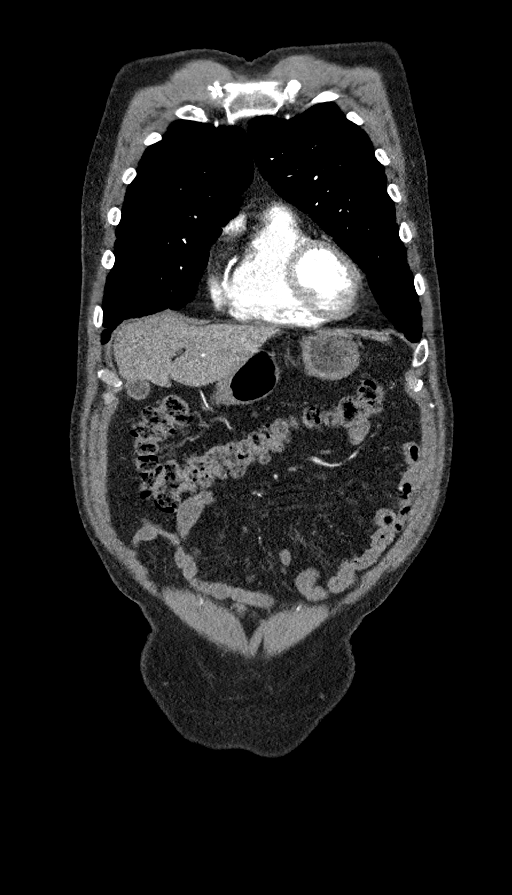

[10 of 46 positions shown; findings below may reference images not displayed]

Multidetector CT imaging through the chest, abdomen and pelvis was
performed using the standard protocol during bolus administration of
intravenous contrast. Multiplanar reconstructed images and MIPs were
obtained and reviewed to evaluate the vascular anatomy.

CONTRAST:  100mL OMNIPAQUE IOHEXOL 350 MG/ML SOLN
FINDINGS: CTA CHEST FINDINGS

Vascular Findings:

Similar appearing fusiform aneurysmal dilatation of the proximal
descending thoracic aorta at location of previously noted intramural
hematoma. Review of the precontrast images is negative for the
presence of a acute intramural hematoma though there is persistent
wall thickening primarily involving the posterior left lateral
aspect of the proximal descending thoracic aorta, unchanged. The
ascending thoracic aorta remains of normal caliber. No definite
evidence of thoracic aortic dissection or periaortic stranding on
this nongated examination. Redemonstrated hypertrophy of the right
bronchial artery.

Conventional configuration of the aortic arch. The branch vessels of
the aortic arch appear patent throughout their imaged courses.

Normal heart size. Coronary artery calcifications. No pericardial
effusion.

Although this examination was not tailored for the evaluation the
pulmonary arteries, there are no discrete filling defects within the
central pulmonary arterial tree to suggest central pulmonary
embolism. Normal caliber of the main pulmonary artery.

-------------------------------------------------------------

Thoracic aortic measurements:

SINOTUBULAR JUNCTION: 36 mm as measured in greatest oblique short
axis coronal dimension.

PROXIMAL ASCENDING THORACIC AORTA: 38 mm as measured in greatest
oblique short axis axial dimension at the level of the main
pulmonary artery (axial image 49, series 5) and approximately 38 mm
in greatest oblique short axis coronal diameter (coronal image 95,
series 8)

AORTIC ARCH: 29 mm as measured in greatest oblique short axis
sagittal dimension.

PROXIMAL DESCENDING THORACIC AORTA: 31 mm as measured in greatest
oblique short axis axial dimension at the level of the main
pulmonary artery (axial image 49, series 5).

Redemonstrated fusiform aneurysmal dilatation involving the proximal
aspect of the descending thoracic aorta measuring approximately 41 x
41 x 41 mm (coronal image 61, series 8; axial image 32, series 5 and
sagittal image 120, series 9), grossly unchanged compared to the
[DATE] examination, previously, 42 x 41 x 40 mm.

DISTAL DESCENDING THORACIC AORTA: 30 mm as measured in greatest
oblique short axis axial dimension at the level of the diaphragmatic
hiatus.

Review of the MIP images confirms the above findings.

-------------------------------------------------------------

Non-Vascular Findings:

Mediastinum/Lymph Nodes: Redemonstrated mildly prominent though non
pathologically enlarged mediastinal and bilateral hilar lymph nodes
with index precarinal lymph node measuring 0.8 cm in greatest short
axis diameter (image 43, series 5), index right suprahilar lymph
node measuring 0.9 cm (image 50, series 5), presumably reactive in
etiology. No bulky mediastinal, hilar or axillary lymphadenopathy.

Lungs/Pleura: Improved aeration of the lungs with minimal residual
ill-defined subpleural ground-glass opacities, most conspicuous
within the bilateral upper lobes (representative images 15 and 20,
series 7), likely residual atelectasis/scarring as a sequela of
previous COVID pneumonia. No new focal airspace opacities. No
pleural effusion or pneumothorax. The central pulmonary airways
appear widely patent.

Redemonstrated subpleural calcified granuloma within the right lower
lobe (image 44, series 7), unchanged. No discrete worrisome
noncalcified pulmonary nodules.

Musculoskeletal: No acute or aggressive osseous abnormalities.
Mild-to-moderate thoracic spine DDD, worse at T2-T3, T10-T11 and
T11-T12 with disc space height loss, endplate irregularity and
sclerosis. Mild bilateral gynecomastia. The thyroid gland appears
somewhat atrophic but without discrete nodule.

_________________________________________________________

CTA ABDOMEN AND PELVIS FINDINGS

VASCULAR

Aorta: There is a minimal to moderate amount of eccentric
predominantly calcified atherosclerotic plaque within the abdominal
aorta, not resulting in a hemodynamically significant stenosis.
Redemonstrated penetrating atherosclerotic ulcer involving the right
lateral aspect of the abdominal aorta measuring approximately 2.2 x
0.8 cm (coronal image 82, series 8 with associated nonocclusive
intimal web (image 138, series 5).

The infrarenal abdominal aorta remains ectatic at the level of the
penetrating atherosclerotic ulcer measuring approximately 2.7 x
x 2.8 cm as measured in greatest oblique short axis axial (image
138, series 5), coronal (image 81, series 8) and sagittal (image
106, series 9), dimensions respectively, unchanged compared to the
[DATE] examination by my direct remeasurement. No periaortic
stranding.

Celiac: There is a very minimal amount of calcified atherosclerotic
plaque involving the origin the celiac artery, not resulting in a
hemodynamically significant stenosis. Conventional branching
pattern.

SMA: There is a minimal amount of calcified atherosclerotic plaque
involving the origin the SMA, not resulting in a hemodynamically
significant stenosis. Conventional branching pattern. The distal
tributaries of the SMA appear widely patent without discrete
internal filling defect to suggest distal embolism.

Renals: Note is made of 3 separate right renal arteries and
duplicated left renal arteries. Two tiny accessory right renal
artery supplies the inferior pole of the right kidney while an
accessory left renal artery supplies the inferior pole of the left
kidney. All accessory renal arteries arise inferior to the takeoff
of the IMA.

There is a minimal amount of eccentric mixed calcified and
noncalcified atherosclerotic plaque involving the origin of the
dominant bilateral renal arteries, not resulting in a
hemodynamically significant stenosis. No vessel irregularity to
suggest FMD.

IMA: Remains patent.

Inflow: There is a minimal amount of eccentric calcified
atherosclerotic plaque involving the bilateral common iliac
arteries, not resulting in hemodynamically significant stenosis. The
bilateral internal iliac arteries are disease though patent and of
normal caliber. The bilateral external iliac arteries are tortuous
and mildly ectatic with the right measuring 1.5 cm (image 165,
series 5) and the left measuring 1.2 cm (image 183, series 5),
though widely patent without hemodynamically significant narrowing.

The bilateral common and imaged portions of the bilateral deep and
superficial femoral arteries are widely patent without
hemodynamically significant narrowing.

Veins: The IVC and pelvic venous systems appear widely patent on
this arterial phase examination.

Review of the MIP images confirms the above findings.

NON-VASCULAR

Evaluation of abdominal organs is limited to the arterial phase of
enhancement.

Hepatobiliary: Mild nodularity hepatic contour. No discrete
hyperenhancing hepatic lesions. Normal appearance of the gallbladder
given underdistention. No radiopaque gallstones. No intra or
extrahepatic biliary duct dilatation. No ascites.

Pancreas: Normal appearance of the pancreas.

Spleen: Normal appearance of the spleen.

Adrenals/Urinary Tract: There is symmetric enhancement of the
bilateral kidneys. No definite evidence of nephrolithiasis on this
postcontrast examination. No discrete renal lesions. No urine
obstruction or perinephric stranding.

Normal appearance of the bilateral adrenal glands.

Normal appearance of the urinary bladder given degree of distention.

Stomach/Bowel: Large colonic stool burden without evidence of
enteric obstruction. Colonic diverticulosis without evidence of
superimposed acute diverticulitis. Normal appearance of the terminal
ileum and retrocecal appendix. No significant hiatal hernia. No
pneumoperitoneum, pneumatosis or portal venous gas.

Lymphatic: No bulky retroperitoneal, mesenteric, pelvic or inguinal
lymphadenopathy.

Reproductive: Normal appearance the prostate gland. No free fluid
the pelvic cul-de-sac.

Other: Regional soft tissues appear normal.

Musculoskeletal: No acute or aggressive osseous abnormalities within
the abdomen or pelvis. Diminutive ribs are seen bilaterally at T12.
Moderate severe multilevel lumbar spine DDD, worse at L4-L5 with
disc space height loss, endplate irregularity and small posteriorly
directed disc osteophyte complex at this location. Mild degenerative
change the bilateral hips with joint space loss, subchondral
sclerosis and osteophytosis. Regional soft tissues appear normal.

Review of the MIP images confirms the above findings.
IMPRESSION: Vascular Impression:

1. Stable uncomplicated fusiform aneurysmal dilatation involving the
proximal descending thoracic aorta measuring approximately 41 mm in
maximal diameter, unchanged compared to the [DATE] examination.
2. Redemonstrated penetrating atherosclerotic ulcer involving the
infrarenal abdominal aorta with the abdominal aorta remaining mildly
ectatic at this location measuring approximately 20 mm in maximal
diameter, unchanged compared to the [DATE] examination.
3. Coronary calcifications.  Aortic Atherosclerosis ([id number]).

Nonvascular Impression:

1. Marked improved aeration of the lungs with residual minimal
subpleural ground-glass opacities, most conspicuous within the
bilateral upper lobes, likely residual atelectasis/scarring as
sequela of previous [id number]-99 pneumonia.
2. Colonic diverticulosis without evidence superimposed acute
diverticulitis.

## 2021-10-14 DIAGNOSIS — I10 Essential (primary) hypertension: Secondary | ICD-10-CM | POA: Diagnosis not present

## 2021-10-14 DIAGNOSIS — D490 Neoplasm of unspecified behavior of digestive system: Secondary | ICD-10-CM | POA: Diagnosis not present

## 2021-10-14 DIAGNOSIS — I4891 Unspecified atrial fibrillation: Secondary | ICD-10-CM | POA: Diagnosis not present

## 2021-10-14 DIAGNOSIS — Z87891 Personal history of nicotine dependence: Secondary | ICD-10-CM | POA: Diagnosis not present

## 2021-10-14 DIAGNOSIS — H9201 Otalgia, right ear: Secondary | ICD-10-CM | POA: Diagnosis not present

## 2021-10-14 DIAGNOSIS — K140 Glossitis: Secondary | ICD-10-CM | POA: Diagnosis not present

## 2021-10-14 DIAGNOSIS — J342 Deviated nasal septum: Secondary | ICD-10-CM | POA: Diagnosis not present

## 2021-10-14 DIAGNOSIS — Z7901 Long term (current) use of anticoagulants: Secondary | ICD-10-CM | POA: Diagnosis not present

## 2021-10-26 DIAGNOSIS — I4891 Unspecified atrial fibrillation: Secondary | ICD-10-CM | POA: Diagnosis not present

## 2021-10-26 DIAGNOSIS — D3702 Neoplasm of uncertain behavior of tongue: Secondary | ICD-10-CM | POA: Diagnosis not present

## 2021-10-26 DIAGNOSIS — K140 Glossitis: Secondary | ICD-10-CM | POA: Diagnosis not present

## 2021-10-26 DIAGNOSIS — D3709 Neoplasm of uncertain behavior of other specified sites of the oral cavity: Secondary | ICD-10-CM | POA: Diagnosis not present

## 2021-10-26 DIAGNOSIS — D693 Immune thrombocytopenic purpura: Secondary | ICD-10-CM | POA: Diagnosis not present

## 2021-10-26 DIAGNOSIS — I480 Paroxysmal atrial fibrillation: Secondary | ICD-10-CM | POA: Diagnosis not present

## 2021-10-26 DIAGNOSIS — D490 Neoplasm of unspecified behavior of digestive system: Secondary | ICD-10-CM | POA: Diagnosis not present

## 2021-10-26 DIAGNOSIS — C021 Malignant neoplasm of border of tongue: Secondary | ICD-10-CM | POA: Diagnosis not present

## 2021-10-26 DIAGNOSIS — Z7901 Long term (current) use of anticoagulants: Secondary | ICD-10-CM | POA: Diagnosis not present

## 2021-10-26 DIAGNOSIS — I10 Essential (primary) hypertension: Secondary | ICD-10-CM | POA: Diagnosis not present

## 2021-10-26 DIAGNOSIS — C7989 Secondary malignant neoplasm of other specified sites: Secondary | ICD-10-CM | POA: Diagnosis not present

## 2021-10-26 DIAGNOSIS — Z87891 Personal history of nicotine dependence: Secondary | ICD-10-CM | POA: Diagnosis not present

## 2021-11-04 ENCOUNTER — Other Ambulatory Visit: Payer: Self-pay | Admitting: Cardiology

## 2021-11-04 DIAGNOSIS — C022 Malignant neoplasm of ventral surface of tongue: Secondary | ICD-10-CM | POA: Diagnosis not present

## 2021-11-04 NOTE — Telephone Encounter (Signed)
Prescription refill request for Xarelto received.  ?Indication:Afib ?Last office visit:3/23 ?Weight:85.6 kg ?Age:81 ?Scr:1.2 ?CrCl:61.43 ml/min ? ?Prescription refilled ? ?

## 2021-11-14 DIAGNOSIS — C021 Malignant neoplasm of border of tongue: Secondary | ICD-10-CM | POA: Diagnosis not present

## 2021-11-16 DIAGNOSIS — C029 Malignant neoplasm of tongue, unspecified: Secondary | ICD-10-CM | POA: Diagnosis not present

## 2021-11-24 DIAGNOSIS — L570 Actinic keratosis: Secondary | ICD-10-CM | POA: Diagnosis not present

## 2021-11-24 DIAGNOSIS — L821 Other seborrheic keratosis: Secondary | ICD-10-CM | POA: Diagnosis not present

## 2021-11-24 DIAGNOSIS — L578 Other skin changes due to chronic exposure to nonionizing radiation: Secondary | ICD-10-CM | POA: Diagnosis not present

## 2021-12-01 DIAGNOSIS — R918 Other nonspecific abnormal finding of lung field: Secondary | ICD-10-CM | POA: Diagnosis not present

## 2021-12-01 DIAGNOSIS — C029 Malignant neoplasm of tongue, unspecified: Secondary | ICD-10-CM | POA: Diagnosis not present

## 2021-12-05 ENCOUNTER — Other Ambulatory Visit: Payer: Self-pay

## 2021-12-05 MED ORDER — POTASSIUM CHLORIDE CRYS ER 20 MEQ PO TBCR: 20.0000 meq | EXTENDED_RELEASE_TABLET | Freq: Two times a day (BID) | ORAL | 4 refills | Status: AC

## 2021-12-05 NOTE — Telephone Encounter (Signed)
Rx refill sent to pharmacy. 

## 2021-12-05 NOTE — Telephone Encounter (Signed)
This is Dr. Rodney Booze, provider switch, please address

## 2021-12-07 DIAGNOSIS — C021 Malignant neoplasm of border of tongue: Secondary | ICD-10-CM | POA: Diagnosis not present

## 2021-12-07 DIAGNOSIS — I4891 Unspecified atrial fibrillation: Secondary | ICD-10-CM | POA: Diagnosis not present

## 2021-12-07 DIAGNOSIS — C029 Malignant neoplasm of tongue, unspecified: Secondary | ICD-10-CM | POA: Insufficient documentation

## 2021-12-07 DIAGNOSIS — Z9889 Other specified postprocedural states: Secondary | ICD-10-CM | POA: Diagnosis not present

## 2021-12-07 DIAGNOSIS — Z87891 Personal history of nicotine dependence: Secondary | ICD-10-CM | POA: Diagnosis not present

## 2021-12-07 DIAGNOSIS — R1312 Dysphagia, oropharyngeal phase: Secondary | ICD-10-CM | POA: Diagnosis not present

## 2021-12-07 DIAGNOSIS — I422 Other hypertrophic cardiomyopathy: Secondary | ICD-10-CM | POA: Diagnosis not present

## 2021-12-07 DIAGNOSIS — Z01810 Encounter for preprocedural cardiovascular examination: Secondary | ICD-10-CM | POA: Diagnosis not present

## 2021-12-08 ENCOUNTER — Ambulatory Visit: Payer: Medicare HMO | Admitting: Cardiology

## 2021-12-08 DIAGNOSIS — I4891 Unspecified atrial fibrillation: Secondary | ICD-10-CM | POA: Diagnosis not present

## 2021-12-22 DIAGNOSIS — Z4659 Encounter for fitting and adjustment of other gastrointestinal appliance and device: Secondary | ICD-10-CM | POA: Diagnosis not present

## 2021-12-22 DIAGNOSIS — M069 Rheumatoid arthritis, unspecified: Secondary | ICD-10-CM | POA: Diagnosis not present

## 2021-12-22 DIAGNOSIS — Y848 Other medical procedures as the cause of abnormal reaction of the patient, or of later complication, without mention of misadventure at the time of the procedure: Secondary | ICD-10-CM | POA: Diagnosis not present

## 2021-12-22 DIAGNOSIS — T82329A Displacement of unspecified vascular grafts, initial encounter: Secondary | ICD-10-CM | POA: Diagnosis not present

## 2021-12-22 DIAGNOSIS — E785 Hyperlipidemia, unspecified: Secondary | ICD-10-CM | POA: Diagnosis not present

## 2021-12-22 DIAGNOSIS — C029 Malignant neoplasm of tongue, unspecified: Secondary | ICD-10-CM | POA: Diagnosis not present

## 2021-12-22 DIAGNOSIS — I4891 Unspecified atrial fibrillation: Secondary | ICD-10-CM | POA: Diagnosis not present

## 2021-12-22 DIAGNOSIS — Z930 Tracheostomy status: Secondary | ICD-10-CM | POA: Diagnosis not present

## 2021-12-22 DIAGNOSIS — D62 Acute posthemorrhagic anemia: Secondary | ICD-10-CM | POA: Diagnosis not present

## 2021-12-22 DIAGNOSIS — I10 Essential (primary) hypertension: Secondary | ICD-10-CM | POA: Diagnosis not present

## 2021-12-22 DIAGNOSIS — I871 Compression of vein: Secondary | ICD-10-CM | POA: Diagnosis not present

## 2021-12-22 DIAGNOSIS — R52 Pain, unspecified: Secondary | ICD-10-CM | POA: Diagnosis not present

## 2021-12-22 DIAGNOSIS — T82898A Other specified complication of vascular prosthetic devices, implants and grafts, initial encounter: Secondary | ICD-10-CM | POA: Diagnosis not present

## 2021-12-22 DIAGNOSIS — G4700 Insomnia, unspecified: Secondary | ICD-10-CM | POA: Diagnosis not present

## 2021-12-22 DIAGNOSIS — C028 Malignant neoplasm of overlapping sites of tongue: Secondary | ICD-10-CM | POA: Diagnosis not present

## 2021-12-22 DIAGNOSIS — R197 Diarrhea, unspecified: Secondary | ICD-10-CM | POA: Diagnosis not present

## 2021-12-22 DIAGNOSIS — R1310 Dysphagia, unspecified: Secondary | ICD-10-CM | POA: Diagnosis not present

## 2021-12-22 DIAGNOSIS — J952 Acute pulmonary insufficiency following nonthoracic surgery: Secondary | ICD-10-CM | POA: Diagnosis not present

## 2021-12-22 DIAGNOSIS — E441 Mild protein-calorie malnutrition: Secondary | ICD-10-CM | POA: Diagnosis not present

## 2021-12-22 DIAGNOSIS — R0689 Other abnormalities of breathing: Secondary | ICD-10-CM | POA: Diagnosis not present

## 2021-12-22 DIAGNOSIS — D638 Anemia in other chronic diseases classified elsewhere: Secondary | ICD-10-CM | POA: Diagnosis not present

## 2021-12-24 DIAGNOSIS — T82898A Other specified complication of vascular prosthetic devices, implants and grafts, initial encounter: Secondary | ICD-10-CM | POA: Diagnosis not present

## 2021-12-24 DIAGNOSIS — Z930 Tracheostomy status: Secondary | ICD-10-CM | POA: Diagnosis not present

## 2021-12-24 DIAGNOSIS — C029 Malignant neoplasm of tongue, unspecified: Secondary | ICD-10-CM | POA: Diagnosis not present

## 2021-12-24 DIAGNOSIS — I10 Essential (primary) hypertension: Secondary | ICD-10-CM | POA: Diagnosis not present

## 2021-12-24 DIAGNOSIS — I871 Compression of vein: Secondary | ICD-10-CM | POA: Diagnosis not present

## 2021-12-25 DIAGNOSIS — Z930 Tracheostomy status: Secondary | ICD-10-CM | POA: Diagnosis not present

## 2021-12-25 DIAGNOSIS — I10 Essential (primary) hypertension: Secondary | ICD-10-CM | POA: Diagnosis not present

## 2021-12-25 DIAGNOSIS — C029 Malignant neoplasm of tongue, unspecified: Secondary | ICD-10-CM | POA: Diagnosis not present

## 2021-12-26 DIAGNOSIS — M069 Rheumatoid arthritis, unspecified: Secondary | ICD-10-CM | POA: Diagnosis not present

## 2021-12-26 DIAGNOSIS — E441 Mild protein-calorie malnutrition: Secondary | ICD-10-CM | POA: Diagnosis not present

## 2021-12-26 DIAGNOSIS — I4891 Unspecified atrial fibrillation: Secondary | ICD-10-CM | POA: Diagnosis not present

## 2021-12-26 DIAGNOSIS — G4700 Insomnia, unspecified: Secondary | ICD-10-CM | POA: Diagnosis not present

## 2021-12-26 DIAGNOSIS — R1310 Dysphagia, unspecified: Secondary | ICD-10-CM | POA: Diagnosis not present

## 2021-12-26 DIAGNOSIS — D638 Anemia in other chronic diseases classified elsewhere: Secondary | ICD-10-CM | POA: Diagnosis not present

## 2021-12-26 DIAGNOSIS — C029 Malignant neoplasm of tongue, unspecified: Secondary | ICD-10-CM | POA: Diagnosis not present

## 2021-12-26 DIAGNOSIS — Z930 Tracheostomy status: Secondary | ICD-10-CM | POA: Diagnosis not present

## 2021-12-26 DIAGNOSIS — E785 Hyperlipidemia, unspecified: Secondary | ICD-10-CM | POA: Diagnosis not present

## 2021-12-26 DIAGNOSIS — R0689 Other abnormalities of breathing: Secondary | ICD-10-CM | POA: Diagnosis not present

## 2021-12-26 DIAGNOSIS — I10 Essential (primary) hypertension: Secondary | ICD-10-CM | POA: Diagnosis not present

## 2022-01-02 DIAGNOSIS — Z930 Tracheostomy status: Secondary | ICD-10-CM | POA: Diagnosis not present

## 2022-01-02 DIAGNOSIS — C029 Malignant neoplasm of tongue, unspecified: Secondary | ICD-10-CM | POA: Diagnosis not present

## 2022-01-11 DIAGNOSIS — Z08 Encounter for follow-up examination after completed treatment for malignant neoplasm: Secondary | ICD-10-CM | POA: Diagnosis not present

## 2022-01-11 DIAGNOSIS — Z85810 Personal history of malignant neoplasm of tongue: Secondary | ICD-10-CM | POA: Diagnosis not present

## 2022-01-11 DIAGNOSIS — Z87891 Personal history of nicotine dependence: Secondary | ICD-10-CM | POA: Diagnosis not present

## 2022-01-18 DIAGNOSIS — M549 Dorsalgia, unspecified: Secondary | ICD-10-CM | POA: Diagnosis not present

## 2022-02-02 DIAGNOSIS — C029 Malignant neoplasm of tongue, unspecified: Secondary | ICD-10-CM | POA: Diagnosis not present

## 2022-02-02 DIAGNOSIS — Z930 Tracheostomy status: Secondary | ICD-10-CM | POA: Diagnosis not present

## 2022-02-23 ENCOUNTER — Other Ambulatory Visit: Payer: Self-pay | Admitting: Cardiology

## 2022-03-05 DIAGNOSIS — Z930 Tracheostomy status: Secondary | ICD-10-CM | POA: Diagnosis not present

## 2022-03-05 DIAGNOSIS — C029 Malignant neoplasm of tongue, unspecified: Secondary | ICD-10-CM | POA: Diagnosis not present

## 2022-03-07 DIAGNOSIS — Z0000 Encounter for general adult medical examination without abnormal findings: Secondary | ICD-10-CM | POA: Diagnosis not present

## 2022-03-07 DIAGNOSIS — E785 Hyperlipidemia, unspecified: Secondary | ICD-10-CM | POA: Diagnosis not present

## 2022-03-07 DIAGNOSIS — I70213 Atherosclerosis of native arteries of extremities with intermittent claudication, bilateral legs: Secondary | ICD-10-CM | POA: Diagnosis not present

## 2022-03-07 DIAGNOSIS — I482 Chronic atrial fibrillation, unspecified: Secondary | ICD-10-CM | POA: Diagnosis not present

## 2022-03-07 DIAGNOSIS — Z79899 Other long term (current) drug therapy: Secondary | ICD-10-CM | POA: Diagnosis not present

## 2022-03-07 DIAGNOSIS — D6869 Other thrombophilia: Secondary | ICD-10-CM | POA: Diagnosis not present

## 2022-03-07 DIAGNOSIS — I1310 Hypertensive heart and chronic kidney disease without heart failure, with stage 1 through stage 4 chronic kidney disease, or unspecified chronic kidney disease: Secondary | ICD-10-CM | POA: Diagnosis not present

## 2022-03-07 DIAGNOSIS — E8881 Metabolic syndrome: Secondary | ICD-10-CM | POA: Diagnosis not present

## 2022-03-07 DIAGNOSIS — N1831 Chronic kidney disease, stage 3a: Secondary | ICD-10-CM | POA: Diagnosis not present

## 2022-03-07 DIAGNOSIS — C021 Malignant neoplasm of border of tongue: Secondary | ICD-10-CM | POA: Diagnosis not present

## 2022-03-07 DIAGNOSIS — I7103 Dissection of thoracoabdominal aorta: Secondary | ICD-10-CM | POA: Diagnosis not present

## 2022-03-07 DIAGNOSIS — D693 Immune thrombocytopenic purpura: Secondary | ICD-10-CM | POA: Diagnosis not present

## 2022-03-28 ENCOUNTER — Ambulatory Visit: Payer: Medicare HMO | Admitting: Cardiology

## 2022-03-30 ENCOUNTER — Telehealth: Payer: Self-pay

## 2022-03-30 NOTE — Telephone Encounter (Signed)
Request for Xarelto 20 mg from  [facility] fax # [phone]  Patient has enrolled in [employer], a cost-savings program for Xarelto

## 2022-03-31 ENCOUNTER — Other Ambulatory Visit: Payer: Self-pay

## 2022-03-31 MED ORDER — XARELTO 20 MG PO TABS: ORAL_TABLET | ORAL | 1 refills | Status: DC

## 2022-03-31 NOTE — Telephone Encounter (Signed)
Prescription refill request for Xarelto received.  Indication:Afib Last office visit:3/23 Weight:85.6 kg Age:81 Scr:0.7 CrCl:103.6 ml/min  Prescription refilled

## 2022-04-04 DIAGNOSIS — Z930 Tracheostomy status: Secondary | ICD-10-CM | POA: Diagnosis not present

## 2022-04-04 DIAGNOSIS — C029 Malignant neoplasm of tongue, unspecified: Secondary | ICD-10-CM | POA: Diagnosis not present

## 2022-04-24 DIAGNOSIS — C029 Malignant neoplasm of tongue, unspecified: Secondary | ICD-10-CM | POA: Diagnosis not present

## 2022-05-01 ENCOUNTER — Encounter: Payer: Self-pay | Admitting: Cardiology

## 2022-05-01 ENCOUNTER — Ambulatory Visit: Payer: Medicare HMO | Attending: Cardiology | Admitting: Cardiology

## 2022-05-01 VITALS — BP 110/68 | HR 57 | Ht 71.0 in | Wt 185.4 lb

## 2022-05-01 DIAGNOSIS — I71019 Dissection of thoracic aorta, unspecified: Secondary | ICD-10-CM | POA: Diagnosis not present

## 2022-05-01 DIAGNOSIS — I10 Essential (primary) hypertension: Secondary | ICD-10-CM

## 2022-05-01 DIAGNOSIS — R0600 Dyspnea, unspecified: Secondary | ICD-10-CM | POA: Diagnosis not present

## 2022-05-01 DIAGNOSIS — C029 Malignant neoplasm of tongue, unspecified: Secondary | ICD-10-CM

## 2022-05-01 DIAGNOSIS — E7800 Pure hypercholesterolemia, unspecified: Secondary | ICD-10-CM | POA: Diagnosis not present

## 2022-05-01 DIAGNOSIS — I4821 Permanent atrial fibrillation: Secondary | ICD-10-CM

## 2022-05-01 DIAGNOSIS — Z23 Encounter for immunization: Secondary | ICD-10-CM | POA: Diagnosis not present

## 2022-05-01 NOTE — Patient Instructions (Signed)
Medication Instructions:  Your physician recommends that you continue on your current medications as directed. Please refer to the Current Medication list given to you today. *If you need a refill on your cardiac medications before your next appointment, please call your pharmacy*   Lab Work: NONE If you have labs (blood work) drawn today and your tests are completely normal, you will receive your results only by: [place] (if you have MyChart) OR A paper copy in the mail If you have any lab test that is abnormal or we need to change your treatment, we will call you to review the results.   Testing/Procedures: Your physician has requested that you have an echocardiogram. Echocardiography is a painless test that uses sound waves to create images of your heart. It provides your doctor with information about the size and shape of your heart and how well your heart's chambers and valves are working. This procedure takes approximately one hour. There are no restrictions for this procedure. Please do NOT wear cologne, perfume, aftershave, or lotions (deodorant is allowed). Please arrive 15 minutes prior to your appointment time.    Follow-Up: At [facility], you and your health needs are our priority.  As part of our continuing mission to provide you with exceptional heart care, we have created designated Provider Care Teams.  These Care Teams include your primary Cardiologist (physician) and Advanced Practice Providers (APPs -  Physician Assistants and Nurse Practitioners) who all work together to provide you with the care you need, when you need it.  We recommend signing up for the patient portal called "MyChart".  Sign up information is provided on this After Visit Summary.  MyChart is used to connect with patients for Virtual Visits (Telemedicine).  Patients are able to view lab/test results, encounter notes, upcoming appointments, etc.  Non-urgent messages can be sent to your  provider as well.   To learn more about what you can do with MyChart, go to [email or web address].    Your next appointment:   6 month(s)  The format for your next appointment:   In Person  Provider:   Jenne Campus, MD    Other Instructions   Important Information About Sugar

## 2022-05-01 NOTE — Addendum Note (Signed)
Addended by: Jerl Santos R on: 05/01/2022 10:43 AM   Modules accepted: Orders

## 2022-05-01 NOTE — Progress Notes (Signed)
Cardiology Office Note:    Date:  05/01/2022   ID:  Elvin So [employer] DOB [birth year], MRN [id number]  PCP:  Street, Sharon Mt, MD  Cardiologist:  Jenne Campus, MD    Referring MD: Street, Sharon Mt, *   Chief Complaint  Patient presents with   Follow-up    History of Present Illness:    Jerome Sullivan. is a 81 y.o. male with past medical history significant for permanent atrial fibrillation, he is anticoagulated, type B aortic dissection with intramural hematoma as well as penetrating ulcer managed conservatively, essential hypertension, dyslipidemia.  He comes today 2 months for follow-up overall doing well cardiac wise.  He denies have any chest pain tightness squeezing pressure burning chest but describes fatigue shortness of breath while walking.  He did have squamous cell cancer of the tongue who required surgery some reconstruction surgery seems to be doing well from that point review.  No palpitations dizziness swelling of lower extremities  Past Medical History:  Diagnosis Date   A-fib ([place])    A-fib ([place])    failed cardioversion   Aortic aneurysm ([place]) 08/17/2017   Aortic dissection distal to left subclavian ([place]) 08/17/2017   Atrial fibrillation ([place]) 09/15/2010   HTN (hypertension)    Hypercholesteremia    Hypertension 09/15/2010   Intramural hematoma of descending thoracic and abdominal aorta 08/17/2017    Past Surgical History:  Procedure Laterality Date   BACK SURGERY     cancer removed from his tongue     CARDIOVERSION     HAND SURGERY     HAND SURGERY Right    LASIK     LUMBAR DISC SURGERY      Current Medications: Current Meds  Medication Sig   amitriptyline (ELAVIL) 25 MG tablet Take 12.5 mg by mouth at bedtime.   calcium carbonate (OS-CAL) 600 MG TABS Take 600 mg by mouth 2 (two) times daily.   chlorthalidone (HYGROTON) 25 MG tablet TAKE 1 TABLET BY MOUTH ONCE DAILY   Cholecalciferol (VITAMIN D-3 PO) Take 1 tablet by mouth daily.    Coenzyme Q10 (CO Q 10 PO) Take 1 tablet by mouth 2 (two) times daily.   diltiazem (CARDIZEM CD) 120 MG 24 hr capsule Take 1 capsule (120 mg total) by mouth daily.   folic acid (FOLVITE) 1 MG tablet Take 1 mg by mouth daily.   gabapentin (NEURONTIN) 300 MG capsule Take 300 mg by mouth daily.   labetalol (NORMODYNE) 200 MG tablet Take 1 tablet (200 mg total) by mouth 2 (two) times daily.   Magnesium Gluconate (MAGNESIUM 27 PO) Take 1 tablet by mouth daily.   montelukast (SINGULAIR) 10 MG tablet Take 1 tablet by mouth daily.   potassium chloride SA (KLOR-CON M) 20 MEQ tablet Take 1 tablet (20 mEq total) by mouth 2 (two) times daily. (Patient taking differently: Take 10 mEq by mouth 2 (two) times daily.)   rosuvastatin (CRESTOR) 20 MG tablet Take 20 mg by mouth daily.   XARELTO 20 MG TABS tablet TAKE ONE (1) TABLET BY MOUTH EVERY DAY WITH SUPPER (Patient taking differently: Take 20 mg by mouth daily with supper. TAKE ONE (1) TABLET BY MOUTH EVERY DAY WITH SUPPER)   Zinc 50 MG TABS Take 1 tablet by mouth daily.   [DISCONTINUED] methotrexate (RHEUMATREX) 2.5 MG tablet Take 1 tablet by mouth once a week.     Allergies:   Patient has no known allergies.   Social History   Socioeconomic History  Marital status: Married    Spouse name: Not on file   Number of children: Not on file   Years of education: Not on file   Highest education level: Not on file  Occupational History   Not on file  Tobacco Use   Smoking status: Former    Types: Cigarettes    Quit date: 06/26/1978    Years since quitting: 43.8   Smokeless tobacco: Never  Vaping Use   Vaping Use: Never used  Substance and Sexual Activity   Alcohol use: No   Drug use: No   Sexual activity: Yes    Partners: Female    Birth control/protection: None  Other Topics Concern   Not on file  Social History Narrative   ** Merged History Encounter **       Social Determinants of Health   Financial Resource Strain: Low Risk   (08/17/2017)   Overall Financial Resource Strain (CARDIA)    Difficulty of Paying Living Expenses: Not very hard  Food Insecurity: No Food Insecurity (08/17/2017)   Hunger Vital Sign    Worried About Running Out of Food in the Last Year: Never true    Ran Out of Food in the Last Year: Never true  Transportation Needs: No Transportation Needs (08/17/2017)   PRAPARE - Hydrologist (Medical): No    Lack of Transportation (Non-Medical): No  Physical Activity: Inactive (08/17/2017)   Exercise Vital Sign    Days of Exercise per Week: 0 days    Minutes of Exercise per Session: 0 min  Stress: No Stress Concern Present (08/17/2017)   [place]    Feeling of Stress : Only a little  Social Connections: Unknown (08/17/2017)   Social Connection and Isolation Panel [NHANES]    Frequency of Communication with Friends and Family: Patient refused    Frequency of Social Gatherings with Friends and Family: Patient refused    Attends Religious Services: Patient refused    Marine scientist or Organizations: Patient refused    Attends Music therapist: Patient refused    Marital Status: Patient refused     Family History: The patient's family history includes Atrial fibrillation in his child; Coronary artery disease in his mother; Heart disease in his father and mother; Hypertension in his mother and sister; Stroke in his mother. ROS:   Please see the history of present illness.    All 14 point review of systems negative except as described per history of present illness  EKGs/Labs/Other Studies Reviewed:      Recent Labs: No results found for requested labs within last 365 days.  Recent Lipid Panel    Component Value Date/Time   CHOL 141 08/29/2019 1034   TRIG 69 08/29/2019 1034   HDL 53 08/29/2019 1034   CHOLHDL 2.7 08/29/2019 1034   CHOLHDL 5.1 08/18/2017 0333   VLDL 32 08/18/2017  0333   LDLCALC 74 08/29/2019 1034    Physical Exam:    VS:  BP 110/68 (BP Location: Left Arm, Patient Position: Sitting)   Pulse (!) 57   Ht '5\' 11"'$  (1.803 m)   Wt 185 lb 6.4 oz (84.1 kg)   SpO2 98%   BMI 25.86 kg/m     Wt Readings from Last 3 Encounters:  05/01/22 185 lb 6.4 oz (84.1 kg)  06/01/21 188 lb 12.8 oz (85.6 kg)  04/06/21 196 lb 9.6 oz (89.2 kg)  GEN:  Well nourished, well developed in no acute distress HEENT: Normal NECK: No JVD; No carotid bruits LYMPHATICS: No lymphadenopathy CARDIAC: Irregularly irregular, no murmurs, no rubs, no gallops RESPIRATORY:  Clear to auscultation without rales, wheezing or rhonchi  ABDOMEN: Soft, non-tender, non-distended MUSCULOSKELETAL:  No edema; No deformity  SKIN: Warm and dry LOWER EXTREMITIES: no swelling NEUROLOGIC:  Alert and oriented x 3 PSYCHIATRIC:  Normal affect   ASSESSMENT:    1. Aortic dissection distal to left subclavian (HCC)   2. Primary hypertension   3. Permanent atrial fibrillation ([place])   4. Cancer of tongue ([place])   5. Hypercholesteremia    PLAN:    In order of problems listed above:  Type B aortic dissection distal to left subclavian managed conservatively.  I will schedule him to have CT of his chest, however when I wanted to do to see pulm he did the will have CT of his neck within next 2 months to follow-up his tongue cancer.  We will try to do 1 test CT of his chest as well as neck by that time.  Overall he is asymptomatic with good risk factors management. Permanent atrial fibrillation, anticoagulated, doing well we will repeat EKG today, Dyspnea on exertion: We will do echocardiogram to assess left ventricle ejection fraction Conserve with tongue follow-up excellently by ENT team, status post surgery with resection and then reconstruction of his tongue Dyslipidemia I did review his K PN which show me LDL 74 HDL 45 he is on Crestor 20 which I will continue.   Medication Adjustments/Labs and  Tests Ordered: Current medicines are reviewed at length with the patient today.  Concerns regarding medicines are outlined above.  No orders of the defined types were placed in this encounter.  Medication changes: No orders of the defined types were placed in this encounter.   Signed, Park Liter, MD, [facility] 05/01/2022 10:35 AM    [place]

## 2022-05-05 DIAGNOSIS — C029 Malignant neoplasm of tongue, unspecified: Secondary | ICD-10-CM | POA: Diagnosis not present

## 2022-05-05 DIAGNOSIS — Z930 Tracheostomy status: Secondary | ICD-10-CM | POA: Diagnosis not present

## 2022-05-12 ENCOUNTER — Ambulatory Visit: Payer: Medicare HMO | Attending: Cardiology

## 2022-05-12 DIAGNOSIS — R0600 Dyspnea, unspecified: Secondary | ICD-10-CM | POA: Diagnosis not present

## 2022-05-12 DIAGNOSIS — I4821 Permanent atrial fibrillation: Secondary | ICD-10-CM | POA: Diagnosis not present

## 2022-05-12 DIAGNOSIS — I10 Essential (primary) hypertension: Secondary | ICD-10-CM | POA: Diagnosis not present

## 2022-05-12 DIAGNOSIS — C029 Malignant neoplasm of tongue, unspecified: Secondary | ICD-10-CM

## 2022-05-12 DIAGNOSIS — E7800 Pure hypercholesterolemia, unspecified: Secondary | ICD-10-CM

## 2022-05-12 DIAGNOSIS — I71019 Dissection of thoracic aorta, unspecified: Secondary | ICD-10-CM | POA: Diagnosis not present

## 2022-05-12 LAB — ECHOCARDIOGRAM COMPLETE
Area-P 1/2: 3.2 cm2
MV M vel: 2.97 m/s
MV Peak grad: 35.3 mmHg
S' Lateral: 2.9 cm

## 2022-05-16 ENCOUNTER — Telehealth: Payer: Self-pay

## 2022-05-16 NOTE — Telephone Encounter (Signed)
Results reviewed with pt's spouse- per DPR- as per Dr. Krasowski's note.  Spouse verbalized understanding and had no additional questions. Routed to PCP  

## 2022-06-02 DIAGNOSIS — M10072 Idiopathic gout, left ankle and foot: Secondary | ICD-10-CM | POA: Diagnosis not present

## 2022-06-04 DIAGNOSIS — Z930 Tracheostomy status: Secondary | ICD-10-CM | POA: Diagnosis not present

## 2022-06-04 DIAGNOSIS — C029 Malignant neoplasm of tongue, unspecified: Secondary | ICD-10-CM | POA: Diagnosis not present

## 2022-06-05 DIAGNOSIS — L578 Other skin changes due to chronic exposure to nonionizing radiation: Secondary | ICD-10-CM | POA: Diagnosis not present

## 2022-06-05 DIAGNOSIS — L821 Other seborrheic keratosis: Secondary | ICD-10-CM | POA: Diagnosis not present

## 2022-06-05 DIAGNOSIS — L570 Actinic keratosis: Secondary | ICD-10-CM | POA: Diagnosis not present

## 2022-06-23 DIAGNOSIS — C029 Malignant neoplasm of tongue, unspecified: Secondary | ICD-10-CM | POA: Diagnosis not present

## 2022-07-05 DIAGNOSIS — C029 Malignant neoplasm of tongue, unspecified: Secondary | ICD-10-CM | POA: Diagnosis not present

## 2022-07-05 DIAGNOSIS — Z930 Tracheostomy status: Secondary | ICD-10-CM | POA: Diagnosis not present

## 2022-07-12 ENCOUNTER — Other Ambulatory Visit: Payer: Self-pay | Admitting: Cardiology

## 2022-07-12 NOTE — Telephone Encounter (Signed)
Rx refill sent to pharmacy. 

## 2022-07-24 DIAGNOSIS — C029 Malignant neoplasm of tongue, unspecified: Secondary | ICD-10-CM | POA: Diagnosis not present

## 2022-08-01 DIAGNOSIS — M79642 Pain in left hand: Secondary | ICD-10-CM | POA: Diagnosis not present

## 2022-08-01 DIAGNOSIS — M05741 Rheumatoid arthritis with rheumatoid factor of right hand without organ or systems involvement: Secondary | ICD-10-CM | POA: Diagnosis not present

## 2022-08-01 DIAGNOSIS — R2232 Localized swelling, mass and lump, left upper limb: Secondary | ICD-10-CM | POA: Diagnosis not present

## 2022-08-05 DIAGNOSIS — C029 Malignant neoplasm of tongue, unspecified: Secondary | ICD-10-CM | POA: Diagnosis not present

## 2022-08-05 DIAGNOSIS — Z930 Tracheostomy status: Secondary | ICD-10-CM | POA: Diagnosis not present

## 2022-08-21 DIAGNOSIS — Z862 Personal history of diseases of the blood and blood-forming organs and certain disorders involving the immune mechanism: Secondary | ICD-10-CM | POA: Diagnosis not present

## 2022-08-21 DIAGNOSIS — M0579 Rheumatoid arthritis with rheumatoid factor of multiple sites without organ or systems involvement: Secondary | ICD-10-CM | POA: Diagnosis not present

## 2022-08-21 DIAGNOSIS — R5382 Chronic fatigue, unspecified: Secondary | ICD-10-CM | POA: Diagnosis not present

## 2022-08-21 DIAGNOSIS — Z6824 Body mass index (BMI) 24.0-24.9, adult: Secondary | ICD-10-CM | POA: Diagnosis not present

## 2022-08-21 DIAGNOSIS — M792 Neuralgia and neuritis, unspecified: Secondary | ICD-10-CM | POA: Diagnosis not present

## 2022-08-21 DIAGNOSIS — E790 Hyperuricemia without signs of inflammatory arthritis and tophaceous disease: Secondary | ICD-10-CM | POA: Diagnosis not present

## 2022-09-03 DIAGNOSIS — C029 Malignant neoplasm of tongue, unspecified: Secondary | ICD-10-CM | POA: Diagnosis not present

## 2022-09-03 DIAGNOSIS — Z930 Tracheostomy status: Secondary | ICD-10-CM | POA: Diagnosis not present

## 2022-09-07 DIAGNOSIS — M19032 Primary osteoarthritis, left wrist: Secondary | ICD-10-CM | POA: Diagnosis not present

## 2022-09-07 DIAGNOSIS — M0569 Rheumatoid arthritis of multiple sites with involvement of other organs and systems: Secondary | ICD-10-CM | POA: Diagnosis not present

## 2022-09-07 DIAGNOSIS — M19042 Primary osteoarthritis, left hand: Secondary | ICD-10-CM | POA: Diagnosis not present

## 2022-10-04 DIAGNOSIS — Z930 Tracheostomy status: Secondary | ICD-10-CM | POA: Diagnosis not present

## 2022-10-04 DIAGNOSIS — C029 Malignant neoplasm of tongue, unspecified: Secondary | ICD-10-CM | POA: Diagnosis not present

## 2022-10-09 DIAGNOSIS — C069 Malignant neoplasm of mouth, unspecified: Secondary | ICD-10-CM | POA: Diagnosis not present

## 2022-10-09 DIAGNOSIS — R3989 Other symptoms and signs involving the genitourinary system: Secondary | ICD-10-CM | POA: Diagnosis not present

## 2022-10-09 DIAGNOSIS — M792 Neuralgia and neuritis, unspecified: Secondary | ICD-10-CM | POA: Diagnosis not present

## 2022-10-09 DIAGNOSIS — Z6824 Body mass index (BMI) 24.0-24.9, adult: Secondary | ICD-10-CM | POA: Diagnosis not present

## 2022-10-09 DIAGNOSIS — R5382 Chronic fatigue, unspecified: Secondary | ICD-10-CM | POA: Diagnosis not present

## 2022-10-09 DIAGNOSIS — M0579 Rheumatoid arthritis with rheumatoid factor of multiple sites without organ or systems involvement: Secondary | ICD-10-CM | POA: Diagnosis not present

## 2022-10-09 DIAGNOSIS — E790 Hyperuricemia without signs of inflammatory arthritis and tophaceous disease: Secondary | ICD-10-CM | POA: Diagnosis not present

## 2022-10-09 DIAGNOSIS — Z862 Personal history of diseases of the blood and blood-forming organs and certain disorders involving the immune mechanism: Secondary | ICD-10-CM | POA: Diagnosis not present

## 2022-10-11 ENCOUNTER — Other Ambulatory Visit: Payer: Self-pay | Admitting: Cardiology

## 2022-10-11 DIAGNOSIS — Z85818 Personal history of malignant neoplasm of other sites of lip, oral cavity, and pharynx: Secondary | ICD-10-CM | POA: Diagnosis not present

## 2022-10-11 DIAGNOSIS — R1311 Dysphagia, oral phase: Secondary | ICD-10-CM | POA: Diagnosis not present

## 2022-10-11 DIAGNOSIS — C029 Malignant neoplasm of tongue, unspecified: Secondary | ICD-10-CM | POA: Diagnosis not present

## 2022-10-11 DIAGNOSIS — Q381 Ankyloglossia: Secondary | ICD-10-CM | POA: Diagnosis not present

## 2022-10-11 DIAGNOSIS — Z08 Encounter for follow-up examination after completed treatment for malignant neoplasm: Secondary | ICD-10-CM | POA: Diagnosis not present

## 2022-10-11 DIAGNOSIS — Z85810 Personal history of malignant neoplasm of tongue: Secondary | ICD-10-CM | POA: Diagnosis not present

## 2022-10-11 DIAGNOSIS — I4821 Permanent atrial fibrillation: Secondary | ICD-10-CM

## 2022-10-11 NOTE — Telephone Encounter (Signed)
Xarelto 20mg  refill request received. Pt is 82 years old, weight-84.1kg, Crea-0.77 on 01/01/2022 from [facility], last seen by Dr. Bing Matter on 05/01/22, Diagnosis-Afib, CrCl-92.17ml/min; Dose is appropriate based on dosing criteria. Will send in refill to requested pharmacy.

## 2022-10-17 DIAGNOSIS — B351 Tinea unguium: Secondary | ICD-10-CM | POA: Diagnosis not present

## 2022-10-17 DIAGNOSIS — L578 Other skin changes due to chronic exposure to nonionizing radiation: Secondary | ICD-10-CM | POA: Diagnosis not present

## 2022-10-17 DIAGNOSIS — L600 Ingrowing nail: Secondary | ICD-10-CM | POA: Diagnosis not present

## 2022-10-17 DIAGNOSIS — L570 Actinic keratosis: Secondary | ICD-10-CM | POA: Diagnosis not present

## 2023-01-09 DIAGNOSIS — Z6823 Body mass index (BMI) 23.0-23.9, adult: Secondary | ICD-10-CM | POA: Diagnosis not present

## 2023-01-09 DIAGNOSIS — R5382 Chronic fatigue, unspecified: Secondary | ICD-10-CM | POA: Diagnosis not present

## 2023-01-09 DIAGNOSIS — R3989 Other symptoms and signs involving the genitourinary system: Secondary | ICD-10-CM | POA: Diagnosis not present

## 2023-01-09 DIAGNOSIS — C069 Malignant neoplasm of mouth, unspecified: Secondary | ICD-10-CM | POA: Diagnosis not present

## 2023-01-09 DIAGNOSIS — E790 Hyperuricemia without signs of inflammatory arthritis and tophaceous disease: Secondary | ICD-10-CM | POA: Diagnosis not present

## 2023-01-09 DIAGNOSIS — Z862 Personal history of diseases of the blood and blood-forming organs and certain disorders involving the immune mechanism: Secondary | ICD-10-CM | POA: Diagnosis not present

## 2023-01-09 DIAGNOSIS — M792 Neuralgia and neuritis, unspecified: Secondary | ICD-10-CM | POA: Diagnosis not present

## 2023-01-09 DIAGNOSIS — M0579 Rheumatoid arthritis with rheumatoid factor of multiple sites without organ or systems involvement: Secondary | ICD-10-CM | POA: Diagnosis not present

## 2023-01-24 DIAGNOSIS — M792 Neuralgia and neuritis, unspecified: Secondary | ICD-10-CM | POA: Diagnosis not present

## 2023-01-24 DIAGNOSIS — M79672 Pain in left foot: Secondary | ICD-10-CM | POA: Diagnosis not present

## 2023-01-24 DIAGNOSIS — B351 Tinea unguium: Secondary | ICD-10-CM | POA: Diagnosis not present

## 2023-01-24 DIAGNOSIS — I70213 Atherosclerosis of native arteries of extremities with intermittent claudication, bilateral legs: Secondary | ICD-10-CM | POA: Diagnosis not present

## 2023-01-24 DIAGNOSIS — Z6826 Body mass index (BMI) 26.0-26.9, adult: Secondary | ICD-10-CM | POA: Diagnosis not present

## 2023-01-24 DIAGNOSIS — Z6824 Body mass index (BMI) 24.0-24.9, adult: Secondary | ICD-10-CM | POA: Diagnosis not present

## 2023-01-24 DIAGNOSIS — M2012 Hallux valgus (acquired), left foot: Secondary | ICD-10-CM | POA: Diagnosis not present

## 2023-02-02 ENCOUNTER — Encounter: Payer: Self-pay | Admitting: Cardiology

## 2023-02-02 ENCOUNTER — Ambulatory Visit: Payer: Medicare HMO | Attending: Cardiology | Admitting: Cardiology

## 2023-02-02 VITALS — BP 108/62 | HR 61 | Ht 72.0 in | Wt 177.4 lb

## 2023-02-02 DIAGNOSIS — I7113 Aneurysm of the descending thoracic aorta, ruptured: Secondary | ICD-10-CM

## 2023-02-02 DIAGNOSIS — I71019 Dissection of thoracic aorta, unspecified: Secondary | ICD-10-CM | POA: Diagnosis not present

## 2023-02-02 DIAGNOSIS — E7800 Pure hypercholesterolemia, unspecified: Secondary | ICD-10-CM

## 2023-02-02 DIAGNOSIS — I10 Essential (primary) hypertension: Secondary | ICD-10-CM

## 2023-02-02 LAB — BASIC METABOLIC PANEL
BUN/Creatinine Ratio: 16 (ref 10–24)
BUN: 16 mg/dL (ref 8–27)
CO2: 31 mmol/L — ABNORMAL HIGH (ref 20–29)
Calcium: 10.2 mg/dL (ref 8.6–10.2)
Chloride: 101 mmol/L (ref 96–106)
Creatinine, Ser: 1.03 mg/dL (ref 0.76–1.27)
Glucose: 88 mg/dL (ref 70–99)
Potassium: 3.2 mmol/L — ABNORMAL LOW (ref 3.5–5.2)
Sodium: 145 mmol/L — ABNORMAL HIGH (ref 134–144)
eGFR: 73 mL/min/{1.73_m2} (ref >59)

## 2023-02-02 LAB — LIPID PANEL
Chol/HDL Ratio: 3.2 ratio (ref 0.0–5.0)
Cholesterol, Total: 117 mg/dL (ref 100–199)
HDL: 37 mg/dL — ABNORMAL LOW (ref >39)
LDL Chol Calc (NIH): 59 mg/dL (ref 0–99)
Triglycerides: 114 mg/dL (ref 0–149)
VLDL Cholesterol Cal: 21 mg/dL (ref 5–40)

## 2023-02-02 LAB — AST: AST: 37 IU/L (ref 0–40)

## 2023-02-02 LAB — ALT: ALT: 25 IU/L (ref 0–44)

## 2023-02-02 NOTE — Addendum Note (Signed)
Addended by: Heywood Bene on: 02/02/2023 10:28 AM   Modules accepted: Orders

## 2023-02-02 NOTE — Progress Notes (Signed)
Cardiology Office Note:    Date:  02/02/2023   ID:  [name], DOB 1941-01-26, MRN [id number]  PCP:  Street, Stephanie Coup, MD  Cardiologist:  Gypsy Balsam, MD    Referring MD: Street, Stephanie Coup, *   Chief Complaint  Patient presents with   Follow-up    History of Present Illness:    Jerome Sullivan. is a 82 y.o. male with past medical history significant for permanent atrial fibrillation, he is anticoagulant Xarelto, type B aortic dissection with infra neural hematoma as well as penetrating ulcer managed conservatively, essential hypertension, dyslipidemia. Comes today to months for follow-up we will doing fine.  Denies have any chest pain tightness squeezing pressure burning chest.  No pain in the back.  He still works in the garden and with no difficulties  Past Medical History:  Diagnosis Date   A-fib (HCC)    A-fib (HCC)    failed cardioversion   Aortic aneurysm (HCC) 08/17/2017   Aortic dissection distal to left subclavian (HCC) 08/17/2017   Atrial fibrillation (HCC) 09/15/2010   HTN (hypertension)    Hypercholesteremia    Hypertension 09/15/2010   Intramural hematoma of descending thoracic and abdominal aorta 08/17/2017    Past Surgical History:  Procedure Laterality Date   BACK SURGERY     cancer removed from his tongue     CARDIOVERSION     HAND SURGERY     HAND SURGERY Right    LASIK     LUMBAR DISC SURGERY      Current Medications: Current Meds  Medication Sig   amitriptyline (ELAVIL) 25 MG tablet Take 12.5 mg by mouth at bedtime.   calcium carbonate (OS-CAL) 600 MG TABS Take 600 mg by mouth 2 (two) times daily.   chlorthalidone (HYGROTON) 25 MG tablet Take 1 tablet by mouth once daily.   Cholecalciferol (VITAMIN D-3 PO) Take 1 tablet by mouth daily.   Coenzyme Q10 (CO Q 10 PO) Take 1 tablet by mouth 2 (two) times daily.   diltiazem (CARDIZEM CD) 120 MG 24 hr capsule Take 1 capsule (120 mg total) by mouth daily.   folic acid  (FOLVITE) 1 MG tablet Take 1 mg by mouth daily.   gabapentin (NEURONTIN) 300 MG capsule Take 300 mg by mouth daily.   labetalol (NORMODYNE) 200 MG tablet Take 1 tablet (200 mg total) by mouth 2 (two) times daily.   Magnesium Gluconate (MAGNESIUM 27 PO) Take 1 tablet by mouth daily.   montelukast (SINGULAIR) 10 MG tablet Take 1 tablet by mouth daily.   potassium chloride SA (KLOR-CON M) 20 MEQ tablet Take 1 tablet (20 mEq total) by mouth 2 (two) times daily. (Patient taking differently: Take 10 mEq by mouth 2 (two) times daily.)   rosuvastatin (CRESTOR) 20 MG tablet Take 20 mg by mouth daily.   XARELTO 20 MG TABS tablet TAKE ONE (1) TABLET BY MOUTH EVERY DAY WITH SUPPER (Patient taking differently: Take 20 mg by mouth daily with supper. TAKE ONE (1) TABLET BY MOUTH EVERY DAY WITH SUPPER)   Zinc 50 MG TABS Take 1 tablet by mouth daily.     Allergies:   Patient has no known allergies.   Social History   Socioeconomic History   Marital status: Married    Spouse name: Not on file   Number of children: Not on file   Years of education: Not on file   Highest education level: Not on file  Occupational History   Not on  file  Tobacco Use   Smoking status: Former    Current packs/day: 0.00    Types: Cigarettes    Quit date: 06/26/1978    Years since quitting: 44.6   Smokeless tobacco: Never  Vaping Use   Vaping status: Never Used  Substance and Sexual Activity   Alcohol use: No   Drug use: No   Sexual activity: Yes    Partners: Female    Birth control/protection: None  Other Topics Concern   Not on file  Social History Narrative   ** Merged History Encounter **       Social Determinants of Health   Financial Resource Strain: Low Risk  (08/17/2017)   Overall Financial Resource Strain (CARDIA)    Difficulty of Paying Living Expenses: Not very hard  Food Insecurity: No Food Insecurity (08/17/2017)   Hunger Vital Sign    Worried About Running Out of Food in the Last Year: Never true     Ran Out of Food in the Last Year: Never true  Transportation Needs: No Transportation Needs (08/17/2017)   PRAPARE - Administrator, Civil Service (Medical): No    Lack of Transportation (Non-Medical): No  Physical Activity: Inactive (08/17/2017)   Exercise Vital Sign    Days of Exercise per Week: 0 days    Minutes of Exercise per Session: 0 min  Stress: No Stress Concern Present (08/17/2017)   [employer] of Occupational Health - Occupational Stress Questionnaire    Feeling of Stress : Only a little  Social Connections: Unknown (08/17/2017)   Social Connection and Isolation Panel [NHANES]    Frequency of Communication with Friends and Family: Patient declined    Frequency of Social Gatherings with Friends and Family: Patient declined    Attends Religious Services: Patient declined    Database administrator or Organizations: Patient declined    Attends Engineer, structural: Patient declined    Marital Status: Patient declined     Family History: The patient's family history includes Atrial fibrillation in his child; Coronary artery disease in his mother; Heart disease in his father and mother; Hypertension in his mother and sister; Stroke in his mother. ROS:   Please see the history of present illness.    All 14 point review of systems negative except as described per history of present illness  EKGs/Labs/Other Studies Reviewed:    EKG Interpretation Date/Time:  Friday February 02 2023 09:45:44 EDT Ventricular Rate:  69 PR Interval:    QRS Duration:  90 QT Interval:  404 QTC Calculation: 432 R Axis:   50  Text Interpretation: Atrial fibrillation ST & T wave abnormality, consider lateral ischemia Abnormal ECG When compared with ECG of 17-Aug-2017 12:15, T wave inversion now evident in Anterolateral leads Confirmed by Gypsy Balsam [phone]) on 02/02/2023 10:03:26 AM    Recent Labs: No results found for requested labs within last 365 days.  Recent  Lipid Panel    Component Value Date/Time   CHOL 141 08/29/2019 1034   TRIG 69 08/29/2019 1034   HDL 53 08/29/2019 1034   CHOLHDL 2.7 08/29/2019 1034   CHOLHDL 5.1 08/18/2017 0333   VLDL 32 08/18/2017 0333   LDLCALC 74 08/29/2019 1034    Physical Exam:    VS:  BP 108/62 (BP Location: Left Arm, Patient Position: Sitting)   Pulse 61   Ht 6' (1.829 m)   Wt 177 lb 6.4 oz (80.5 kg)   SpO2 97%   BMI 24.06 kg/m  Wt Readings from Last 3 Encounters:  02/02/23 177 lb 6.4 oz (80.5 kg)  05/01/22 185 lb 6.4 oz (84.1 kg)  06/01/21 188 lb 12.8 oz (85.6 kg)     GEN:  Well nourished, well developed in no acute distress HEENT: Normal NECK: No JVD; No carotid bruits LYMPHATICS: No lymphadenopathy CARDIAC: Irregularly regular, no murmurs, no rubs, no gallops RESPIRATORY:  Clear to auscultation without rales, wheezing or rhonchi  ABDOMEN: Soft, non-tender, non-distended MUSCULOSKELETAL:  No edema; No deformity  SKIN: Warm and dry LOWER EXTREMITIES: no swelling NEUROLOGIC:  Alert and oriented x 3 PSYCHIATRIC:  Normal affect   ASSESSMENT:    1. Primary hypertension   2. Aortic dissection distal to left subclavian (HCC)   3. Ruptured aneurysm of descending thoracic aorta (HCC)   4. Hypercholesteremia    PLAN:    In order of problems listed above:  Aortic dissection distal to subclavian artery therapy.  Manage conservatively.  Will schedule him to have CT of the chest and abdomen again to follow-up this is 3 years of the last test. Essential hypertension blood pressure well-controlled continue present management. Dyslipidemia I did review blood test data from 2023 with total cholesterol 134 HDL 45 he is on Crestor 20 which I will continue. Atrial fibrillation which is permanent.  Rate controlled with Cardizem 120.  He is on Xarelto which I will continue dose is appropriate to his kidney function.   Medication Adjustments/Labs and Tests Ordered: Current medicines are reviewed at  length with the patient today.  Concerns regarding medicines are outlined above.  Orders Placed This Encounter  Procedures   EKG 12-Lead   Medication changes: No orders of the defined types were placed in this encounter.   Signed, Georgeanna Lea, MD, [facility] 02/02/2023 10:13 AM    [facility]

## 2023-02-02 NOTE — Patient Instructions (Addendum)
Medication Instructions:  Your physician recommends that you continue on your current medications as directed. Please refer to the Current Medication list given to you today.  *If you need a refill on your cardiac medications before your next appointment, please call your pharmacy*   Lab Work: Your physician recommends that you return for lab work in: Please come in fasting for lipid profile, AST and ALT next week  BMET today If you have labs (blood work) drawn today and your tests are completely normal, you will receive your results only by: MyChart Message (if you have MyChart) OR A paper copy in the mail If you have any lab test that is abnormal or we need to change your treatment, we will call you to review the results.   Testing/Procedures: Non-Cardiac CT Angiography (CTA), is a special type of CT scan that uses a computer to produce multi-dimensional views of major blood vessels throughout the body. In CT angiography, a contrast material is injected through an IV to help visualize the blood vessels    Follow-Up: At [facility], you and your health needs are our priority.  As part of our continuing mission to provide you with exceptional heart care, we have created designated Provider Care Teams.  These Care Teams include your primary Cardiologist (physician) and Advanced Practice Providers (APPs -  Physician Assistants and Nurse Practitioners) who all work together to provide you with the care you need, when you need it.  We recommend signing up for the patient portal called "MyChart".  Sign up information is provided on this After Visit Summary.  MyChart is used to connect with patients for Virtual Visits (Telemedicine).  Patients are able to view lab/test results, encounter notes, upcoming appointments, etc.  Non-urgent messages can be sent to your provider as well.   To learn more about what you can do with MyChart, go to [email or web address].    Your next appointment:    6 month(s)  Provider:   Gypsy Balsam, MD    Other Instructions

## 2023-02-13 DIAGNOSIS — Z961 Presence of intraocular lens: Secondary | ICD-10-CM | POA: Diagnosis not present

## 2023-02-13 DIAGNOSIS — Z0100 Encounter for examination of eyes and vision without abnormal findings: Secondary | ICD-10-CM | POA: Diagnosis not present

## 2023-02-13 DIAGNOSIS — H5203 Hypermetropia, bilateral: Secondary | ICD-10-CM | POA: Diagnosis not present

## 2023-02-13 DIAGNOSIS — H353131 Nonexudative age-related macular degeneration, bilateral, early dry stage: Secondary | ICD-10-CM | POA: Diagnosis not present

## 2023-02-13 DIAGNOSIS — Z9849 Cataract extraction status, unspecified eye: Secondary | ICD-10-CM | POA: Diagnosis not present

## 2023-02-13 DIAGNOSIS — H524 Presbyopia: Secondary | ICD-10-CM | POA: Diagnosis not present

## 2023-02-13 DIAGNOSIS — H3530 Unspecified macular degeneration: Secondary | ICD-10-CM | POA: Diagnosis not present

## 2023-02-13 DIAGNOSIS — H52223 Regular astigmatism, bilateral: Secondary | ICD-10-CM | POA: Diagnosis not present

## 2023-02-15 ENCOUNTER — Telehealth: Payer: Self-pay

## 2023-02-15 NOTE — Telephone Encounter (Signed)
-----   Message from Gypsy Balsam sent at 02/06/2023 12:57 PM EDT ----- Lab work show good cholesterol control, potassium still low, I believe he take 2 tablets of 10 mg potassium daily we need to increase this to 3 tablets and Chem-7 need to be done within a week

## 2023-02-15 NOTE — Telephone Encounter (Signed)
Spoke with Corrie Dandy, notified of results and stated the patient is not on Potassium. I forward this information to Dr. Kirtland Bouchard for further instructions.

## 2023-02-21 DIAGNOSIS — Q381 Ankyloglossia: Secondary | ICD-10-CM | POA: Diagnosis not present

## 2023-02-21 DIAGNOSIS — C029 Malignant neoplasm of tongue, unspecified: Secondary | ICD-10-CM | POA: Diagnosis not present

## 2023-02-21 DIAGNOSIS — R1311 Dysphagia, oral phase: Secondary | ICD-10-CM | POA: Diagnosis not present

## 2023-02-27 ENCOUNTER — Telehealth: Payer: Self-pay

## 2023-02-27 ENCOUNTER — Ambulatory Visit (HOSPITAL_COMMUNITY)
Admission: RE | Admit: 2023-02-27 | Discharge: 2023-02-27 | Disposition: A | Discharge status: Home / Self Care | Payer: Medicare HMO | Source: Ambulatory Visit | Attending: Cardiology | Admitting: Cardiology

## 2023-02-27 DIAGNOSIS — I7113 Aneurysm of the descending thoracic aorta, ruptured: Secondary | ICD-10-CM | POA: Diagnosis not present

## 2023-02-27 DIAGNOSIS — I71019 Dissection of thoracic aorta, unspecified: Secondary | ICD-10-CM | POA: Insufficient documentation

## 2023-02-27 DIAGNOSIS — I10 Essential (primary) hypertension: Secondary | ICD-10-CM | POA: Diagnosis not present

## 2023-02-27 DIAGNOSIS — E7800 Pure hypercholesterolemia, unspecified: Secondary | ICD-10-CM | POA: Insufficient documentation

## 2023-02-27 DIAGNOSIS — I2510 Atherosclerotic heart disease of native coronary artery without angina pectoris: Secondary | ICD-10-CM | POA: Diagnosis not present

## 2023-02-27 DIAGNOSIS — I700 Atherosclerosis of aorta: Secondary | ICD-10-CM | POA: Diagnosis not present

## 2023-02-27 MED ORDER — IOHEXOL 350 MG/ML SOLN
75.0000 mL | Freq: Once | INTRAVENOUS | Status: AC | PRN
  Administered 2023-02-27: 75 mL via INTRAVENOUS

## 2023-02-27 NOTE — Telephone Encounter (Signed)
I called and LM . I also informed the patient on my chart and I am awaiting pharmacy information form the patient. Lab order on file

## 2023-02-27 NOTE — Telephone Encounter (Signed)
-----   Message from Gypsy Balsam sent at 02/23/2023 10:28 AM EDT ----- Start 20 mg potassium daily, lets get Chem-7 on Tuesday ----- Message ----- From: Heywood Bene, CMA Sent: 02/21/2023   4:31 PM EDT To: Georgeanna Lea, MD  Resnding: Spoke with Corrie Dandy, notified of results and stated the patient is not on Potassium. I forward this information to Dr. Kirtland Bouchard for further instructions. ----- Message ----- From: Heywood Bene, CMA Sent: 02/15/2023   5:12 PM EDT To: Heywood Bene, CMA   ----- Message ----- From: Georgeanna Lea, MD Sent: 02/06/2023  12:57 PM EDT To: Neena Rhymes, RN  Lab work show good cholesterol control, potassium still low, I believe he take 2 tablets of 10 mg potassium daily we need to increase this to 3 tablets and Chem-7 need to be done within a week

## 2023-03-01 ENCOUNTER — Telehealth: Payer: Self-pay

## 2023-03-01 ENCOUNTER — Telehealth: Payer: Self-pay | Admitting: Cardiology

## 2023-03-01 NOTE — Telephone Encounter (Signed)
CT Results reviewed with pt as per Dr. Krasowski's note.  Pt verbalized understanding and had no additional questions. Routed to PCP  

## 2023-03-01 NOTE — Telephone Encounter (Signed)
-----   Message from Gypsy Balsam sent at 02/23/2023 10:28 AM EDT ----- Start 20 mg potassium daily, lets get Chem-7 on Tuesday ----- Message ----- From: Heywood Bene, CMA Sent: 02/21/2023   4:31 PM EDT To: Georgeanna Lea, MD  Resnding: Spoke with Corrie Dandy, notified of results and stated the patient is not on Potassium. I forward this information to Dr. Kirtland Bouchard for further instructions. ----- Message ----- From: Heywood Bene, CMA Sent: 02/15/2023   5:12 PM EDT To: Heywood Bene, CMA   ----- Message ----- From: Georgeanna Lea, MD Sent: 02/06/2023  12:57 PM EDT To: Neena Rhymes, RN  Lab work show good cholesterol control, potassium still low, I believe he take 2 tablets of 10 mg potassium daily we need to increase this to 3 tablets and Chem-7 need to be done within a week

## 2023-03-01 NOTE — Telephone Encounter (Signed)
Pt returning nurses phone call. Please advise ?

## 2023-03-01 NOTE — Telephone Encounter (Signed)
LM to return my call.  I also sent a my chart message, awaiting response.

## 2023-03-06 ENCOUNTER — Telehealth: Payer: Self-pay

## 2023-03-06 NOTE — Telephone Encounter (Signed)
Unable to reach the patient or LM, I mailed a letter requesting a call back.

## 2023-03-06 NOTE — Telephone Encounter (Signed)
-----   Message from Gypsy Balsam sent at 02/23/2023 10:28 AM EDT ----- Start 20 mg potassium daily, lets get Chem-7 on Tuesday ----- Message ----- From: Heywood Bene, CMA Sent: 02/21/2023   4:31 PM EDT To: Georgeanna Lea, MD  Resnding: Spoke with Corrie Dandy, notified of results and stated the patient is not on Potassium. I forward this information to Dr. Kirtland Bouchard for further instructions. ----- Message ----- From: Heywood Bene, CMA Sent: 02/15/2023   5:12 PM EDT To: Heywood Bene, CMA   ----- Message ----- From: Georgeanna Lea, MD Sent: 02/06/2023  12:57 PM EDT To: Neena Rhymes, RN  Lab work show good cholesterol control, potassium still low, I believe he take 2 tablets of 10 mg potassium daily we need to increase this to 3 tablets and Chem-7 need to be done within a week

## 2023-03-08 ENCOUNTER — Ambulatory Visit: Payer: Medicare HMO | Admitting: Podiatry

## 2023-03-08 DIAGNOSIS — G609 Hereditary and idiopathic neuropathy, unspecified: Secondary | ICD-10-CM | POA: Diagnosis not present

## 2023-03-08 DIAGNOSIS — M25572 Pain in left ankle and joints of left foot: Secondary | ICD-10-CM | POA: Diagnosis not present

## 2023-03-08 DIAGNOSIS — M21619 Bunion of unspecified foot: Secondary | ICD-10-CM | POA: Diagnosis not present

## 2023-03-08 DIAGNOSIS — Z23 Encounter for immunization: Secondary | ICD-10-CM | POA: Diagnosis not present

## 2023-03-08 DIAGNOSIS — B351 Tinea unguium: Secondary | ICD-10-CM

## 2023-03-08 NOTE — Progress Notes (Signed)
Chief Complaint  Patient presents with   Bunions    Bunion on the left foot that is sensitive to touch. Patient is not interested in surgery he is requesting conservative methods today.    Peripheral Neuropathy    Patient is complaining of pain and numbness to the right foot near the toes.He is currently taking Gabapentin that was prescribed by his provider for arthritis.    HPI: 82 y.o. male presents today with a painful bunion on the left foot.  Has burning pain in the area.  Pain is mostly on the medial aspect of the bunion.  He is looking for recommendations to treat the nail fungus and improve the appearance of the nails.  He and his wife trim his nails at home and will continue to do so.  He notes he gets some rough skin on the medial aspect of the great toe at the IPJ area.  Notes that he has neuropathy, but is not diabetic.  He takes gabapentin 300 mg for management of his symptoms due to his rheumatoid arthritis.  He states that this helps some.  This is isolated mostly to the right foot  Past Medical History:  Diagnosis Date   A-fib (HCC)    A-fib [facility])    failed cardioversion   Aortic aneurysm (HCC) 08/17/2017   Aortic dissection distal to left subclavian (HCC) 08/17/2017   Atrial fibrillation (HCC) 09/15/2010   HTN (hypertension)    Hypercholesteremia    Hypertension 09/15/2010   Intramural hematoma of descending thoracic and abdominal aorta 08/17/2017    Past Surgical History:  Procedure Laterality Date   BACK SURGERY     cancer removed from his tongue     CARDIOVERSION     HAND SURGERY     HAND SURGERY Right    LASIK     LUMBAR DISC SURGERY      No Known Allergies  ROS    Physical Exam: There were no vitals filed for this visit.  General: The patient is alert and oriented x3 in no acute distress.  Dermatology: Skin is warm, dry and supple bilateral lower extremities. Interspaces are clear of maceration and debris.  Nails are neatly trimmed.  There is  yellow discoloration and 3 mm of thickness to the nails.  There are very mild pinch calluses noted on the plantar medial aspect of the hallux IPJ bilateral.  No surrounding erythema or soft tissue breakdown is noted.  Vascular: Palpable pedal pulses bilaterally. Capillary refill within normal limits.  No appreciable edema.  No erythema or calor.  Neurological: Light touch sensation grossly intact bilateral feet.   Musculoskeletal Exam: Mild bony prominence at the medial aspect of the first metatarsal.  The left foot has pain in this area.  There is pain with percussion of the proper digital nerve in this area as well.  No crepitus with range of motion of the first MPJ.  Mild decreased range of motion of the first MPJ. Assessment/Plan of Care: 1. Joint pain of ankle and foot, left   2. Idiopathic peripheral neuropathy   3. Bunion   4. Dermatophytosis of nail    Discussed clinical findings with patient today.  With the patient's consent a corticosteroid injection was administered, after sterile skin prep, to the left dorsal medial aspect of the first MPJ.  This consisted of a mixture of 1% Xylocaine plain, 0.5% Marcaine plain, and Kenalog 10 for total of 1.25 cc administered.  A Band-Aid was  applied.  He will keep the Band-Aid on until tomorrow.  Continue with the gabapentin 300 mg daily as prescribed by an external provider.  He may use Salonpas gel as needed for additional control of neuropathy symptoms.  Recommended formula 7 solution for the toenail fungus.  They will continue to trim the toenails at home.  This can be applied once daily.  The mild calluses were quickly sanded with an umbrella bur.   Clerance Lav, DPM, FACFAS [facility]     [address] [place], [place] [address]                Office [phone]  Fax [phone]

## 2023-03-14 ENCOUNTER — Ambulatory Visit: Payer: Medicare HMO | Admitting: Podiatry

## 2023-03-14 DIAGNOSIS — M21619 Bunion of unspecified foot: Secondary | ICD-10-CM

## 2023-03-14 DIAGNOSIS — G609 Hereditary and idiopathic neuropathy, unspecified: Secondary | ICD-10-CM

## 2023-03-14 MED ORDER — GABAPENTIN 300 MG PO CAPS
300.0000 mg | ORAL_CAPSULE | ORAL | 4 refills | Status: DC
Start: 2023-03-14 — End: 2023-11-19

## 2023-03-14 NOTE — Progress Notes (Signed)
  Chief Complaint  Patient presents with   Peripheral Neuropathy    Patient c/o tingling to his right foot. Patient stated he needs something to help his neuropathy. He is currently taking Gabapentin 300mg  twice a day.    HPI: 81 y.o. male presents today after just being seen last week, noting his neuropathy, tingling, and burning in the toes is getting worse.  He states that usually his PCP prescribes the gabapentin but was requesting that we see if we can adjust the medication for him.  He is currently taking 300 mg of gabapentin twice daily.  He notes that his symptoms are worse at bedtime and have been preventing him from falling asleep.  Also notes he still having slight bit of bunion pain but that it is improving.  Past Medical History:  Diagnosis Date   A-fib (HCC)    A-fib [facility])    failed cardioversion   Aortic aneurysm (HCC) 08/17/2017   Aortic dissection distal to left subclavian (HCC) 08/17/2017   Atrial fibrillation (HCC) 09/15/2010   HTN (hypertension)    Hypercholesteremia    Hypertension 09/15/2010   Intramural hematoma of descending thoracic and abdominal aorta 08/17/2017    Past Surgical History:  Procedure Laterality Date   BACK SURGERY     cancer removed from his tongue     CARDIOVERSION     HAND SURGERY     HAND SURGERY Right    LASIK     LUMBAR DISC SURGERY     No Known Allergies   Physical Exam: Light touch sensation is diminished to the digits.  Protective sensation is diminished to toes.  Palpable pedal pulses noted bilateral.  Minimal discomfort on the bunion on palpation.  Assessment/Plan of Care: 1. Idiopathic peripheral neuropathy   2. Bunion      Meds ordered this encounter  Medications   gabapentin (NEURONTIN) 300 MG capsule    Sig: Take 1 capsule (300 mg total) by mouth as directed. Take one tablet by mouth in the morning, and take 3 tablets by mouth in the evening.    Dispense:  120 capsule    Refill:  4   Discussed clinical findings with  patient today.  Will have the patient adjust his evening dose of the gabapentin, therefore he will take one 300 mg capsule in the morning, and take three 300 mg tablets in the evening for a total of 900 mg evening dose.  Patient advised to massage Voltaren gel into the bunion daily.  The adjusted dose of gabapentin may also help with his bunion pain.  Follow-up as previously scheduled   Clerance Lav, DPM, FACFAS [facility]     [address] [place], [place] [address]                Office [phone]  Fax [phone]

## 2023-03-15 DIAGNOSIS — M0579 Rheumatoid arthritis with rheumatoid factor of multiple sites without organ or systems involvement: Secondary | ICD-10-CM | POA: Diagnosis not present

## 2023-03-15 DIAGNOSIS — R3989 Other symptoms and signs involving the genitourinary system: Secondary | ICD-10-CM | POA: Diagnosis not present

## 2023-03-15 DIAGNOSIS — R5382 Chronic fatigue, unspecified: Secondary | ICD-10-CM | POA: Diagnosis not present

## 2023-03-15 DIAGNOSIS — E790 Hyperuricemia without signs of inflammatory arthritis and tophaceous disease: Secondary | ICD-10-CM | POA: Diagnosis not present

## 2023-03-15 DIAGNOSIS — Z6823 Body mass index (BMI) 23.0-23.9, adult: Secondary | ICD-10-CM | POA: Diagnosis not present

## 2023-03-15 DIAGNOSIS — M792 Neuralgia and neuritis, unspecified: Secondary | ICD-10-CM | POA: Diagnosis not present

## 2023-03-15 DIAGNOSIS — Z862 Personal history of diseases of the blood and blood-forming organs and certain disorders involving the immune mechanism: Secondary | ICD-10-CM | POA: Diagnosis not present

## 2023-04-04 ENCOUNTER — Other Ambulatory Visit: Payer: Self-pay | Admitting: Cardiology

## 2023-04-04 DIAGNOSIS — I4821 Permanent atrial fibrillation: Secondary | ICD-10-CM

## 2023-04-04 NOTE — Telephone Encounter (Signed)
Prescription refill request for Xarelto received.  Indication: AF Last office visit: 02/02/23  Kandyce Rud MD Weight: 80.5kg Age: 82 Scr: 1.03 on 02/02/23  Epic CrCl: 65.13  Based on above findings Eliquis 5mg  twice daily is the appropriate dose.  Refill approved.

## 2023-04-11 ENCOUNTER — Other Ambulatory Visit: Payer: Self-pay | Admitting: Cardiology

## 2023-04-11 DIAGNOSIS — R0981 Nasal congestion: Secondary | ICD-10-CM | POA: Diagnosis not present

## 2023-04-11 DIAGNOSIS — R051 Acute cough: Secondary | ICD-10-CM | POA: Diagnosis not present

## 2023-04-11 DIAGNOSIS — J309 Allergic rhinitis, unspecified: Secondary | ICD-10-CM | POA: Diagnosis not present

## 2023-04-19 DIAGNOSIS — L820 Inflamed seborrheic keratosis: Secondary | ICD-10-CM | POA: Diagnosis not present

## 2023-04-19 DIAGNOSIS — B351 Tinea unguium: Secondary | ICD-10-CM | POA: Diagnosis not present

## 2023-04-19 DIAGNOSIS — L601 Onycholysis: Secondary | ICD-10-CM | POA: Diagnosis not present

## 2023-04-19 DIAGNOSIS — L570 Actinic keratosis: Secondary | ICD-10-CM | POA: Diagnosis not present

## 2023-04-19 DIAGNOSIS — L578 Other skin changes due to chronic exposure to nonionizing radiation: Secondary | ICD-10-CM | POA: Diagnosis not present

## 2023-05-01 DIAGNOSIS — M21612 Bunion of left foot: Secondary | ICD-10-CM | POA: Diagnosis not present

## 2023-05-01 DIAGNOSIS — Z79899 Other long term (current) drug therapy: Secondary | ICD-10-CM | POA: Diagnosis not present

## 2023-05-01 DIAGNOSIS — Z0000 Encounter for general adult medical examination without abnormal findings: Secondary | ICD-10-CM | POA: Diagnosis not present

## 2023-05-01 DIAGNOSIS — C021 Malignant neoplasm of border of tongue: Secondary | ICD-10-CM | POA: Diagnosis not present

## 2023-05-01 DIAGNOSIS — E88819 Insulin resistance, unspecified: Secondary | ICD-10-CM | POA: Diagnosis not present

## 2023-05-01 DIAGNOSIS — E785 Hyperlipidemia, unspecified: Secondary | ICD-10-CM | POA: Diagnosis not present

## 2023-05-01 DIAGNOSIS — M0579 Rheumatoid arthritis with rheumatoid factor of multiple sites without organ or systems involvement: Secondary | ICD-10-CM | POA: Diagnosis not present

## 2023-05-01 DIAGNOSIS — E559 Vitamin D deficiency, unspecified: Secondary | ICD-10-CM | POA: Diagnosis not present

## 2023-05-01 DIAGNOSIS — I70213 Atherosclerosis of native arteries of extremities with intermittent claudication, bilateral legs: Secondary | ICD-10-CM | POA: Diagnosis not present

## 2023-05-01 DIAGNOSIS — K2960 Other gastritis without bleeding: Secondary | ICD-10-CM | POA: Diagnosis not present

## 2023-05-01 DIAGNOSIS — M792 Neuralgia and neuritis, unspecified: Secondary | ICD-10-CM | POA: Diagnosis not present

## 2023-05-01 DIAGNOSIS — R300 Dysuria: Secondary | ICD-10-CM | POA: Diagnosis not present

## 2023-06-14 DIAGNOSIS — M0579 Rheumatoid arthritis with rheumatoid factor of multiple sites without organ or systems involvement: Secondary | ICD-10-CM | POA: Diagnosis not present

## 2023-06-14 DIAGNOSIS — Z862 Personal history of diseases of the blood and blood-forming organs and certain disorders involving the immune mechanism: Secondary | ICD-10-CM | POA: Diagnosis not present

## 2023-06-14 DIAGNOSIS — R3989 Other symptoms and signs involving the genitourinary system: Secondary | ICD-10-CM | POA: Diagnosis not present

## 2023-06-14 DIAGNOSIS — Z6823 Body mass index (BMI) 23.0-23.9, adult: Secondary | ICD-10-CM | POA: Diagnosis not present

## 2023-06-14 DIAGNOSIS — R5382 Chronic fatigue, unspecified: Secondary | ICD-10-CM | POA: Diagnosis not present

## 2023-06-14 DIAGNOSIS — M792 Neuralgia and neuritis, unspecified: Secondary | ICD-10-CM | POA: Diagnosis not present

## 2023-06-14 DIAGNOSIS — E790 Hyperuricemia without signs of inflammatory arthritis and tophaceous disease: Secondary | ICD-10-CM | POA: Diagnosis not present

## 2023-08-13 DIAGNOSIS — Z6824 Body mass index (BMI) 24.0-24.9, adult: Secondary | ICD-10-CM | POA: Diagnosis not present

## 2023-08-13 DIAGNOSIS — J309 Allergic rhinitis, unspecified: Secondary | ICD-10-CM | POA: Diagnosis not present

## 2023-08-13 DIAGNOSIS — H1010 Acute atopic conjunctivitis, unspecified eye: Secondary | ICD-10-CM | POA: Diagnosis not present

## 2023-08-13 DIAGNOSIS — H16139 Photokeratitis, unspecified eye: Secondary | ICD-10-CM | POA: Diagnosis not present

## 2023-08-13 DIAGNOSIS — M549 Dorsalgia, unspecified: Secondary | ICD-10-CM | POA: Diagnosis not present

## 2023-08-14 DIAGNOSIS — L720 Epidermal cyst: Secondary | ICD-10-CM | POA: Diagnosis not present

## 2023-08-14 DIAGNOSIS — L570 Actinic keratosis: Secondary | ICD-10-CM | POA: Diagnosis not present

## 2023-08-17 ENCOUNTER — Ambulatory Visit: Payer: Medicare Other | Attending: Cardiology | Admitting: Cardiology

## 2023-08-17 ENCOUNTER — Encounter: Payer: Self-pay | Admitting: Cardiology

## 2023-08-17 VITALS — BP 120/76 | HR 63 | Ht 72.0 in | Wt 176.4 lb

## 2023-08-17 DIAGNOSIS — I71019 Dissection of thoracic aorta, unspecified: Secondary | ICD-10-CM

## 2023-08-17 DIAGNOSIS — T148XXA Other injury of unspecified body region, initial encounter: Secondary | ICD-10-CM

## 2023-08-17 DIAGNOSIS — T148XXS Other injury of unspecified body region, sequela: Secondary | ICD-10-CM | POA: Diagnosis not present

## 2023-08-17 DIAGNOSIS — M0579 Rheumatoid arthritis with rheumatoid factor of multiple sites without organ or systems involvement: Secondary | ICD-10-CM | POA: Diagnosis not present

## 2023-08-17 DIAGNOSIS — I7113 Aneurysm of the descending thoracic aorta, ruptured: Secondary | ICD-10-CM

## 2023-08-17 DIAGNOSIS — I4821 Permanent atrial fibrillation: Secondary | ICD-10-CM

## 2023-08-17 NOTE — Progress Notes (Signed)
 Cardiology Office Note:    Date:  08/17/2023   ID:  Charise Carwin Cokesbury., DOB 1941-04-24, MRN [id number]  PCP:  Street, Stephanie Coup, MD  Cardiologist:  Gypsy Balsam, MD    Referring MD: Street, Stephanie Coup, *   Chief Complaint  Patient presents with   Medication Management    Xarelto    History of Present Illness:    Jerome Sullivan. is a 83 y.o. male  with past medical history significant for permanent atrial fibrillation, he is anticoagulant Xarelto, type B aortic dissection with infra neural hematoma as well as penetrating ulcer managed conservatively, essential hypertension, dyslipidemia.  Comes today to months for follow-up overall doing well.  Denies any chest pain tightness squeezing pressure burning chest.  Past Medical History:  Diagnosis Date   A-fib (HCC)    A-fib (HCC)    failed cardioversion   Aortic aneurysm (HCC) 08/17/2017   Aortic dissection distal to left subclavian (HCC) 08/17/2017   Atrial fibrillation (HCC) 09/15/2010   HTN (hypertension)    Hypercholesteremia    Hypertension 09/15/2010   Intramural hematoma of descending thoracic and abdominal aorta 08/17/2017    Past Surgical History:  Procedure Laterality Date   BACK SURGERY     cancer removed from his tongue     CARDIOVERSION     HAND SURGERY     HAND SURGERY Right    LASIK     LUMBAR DISC SURGERY      Current Medications: Current Meds  Medication Sig   amitriptyline (ELAVIL) 25 MG tablet Take 12.5 mg by mouth at bedtime.   calcium carbonate (OS-CAL) 600 MG TABS Take 600 mg by mouth 2 (two) times daily.   chlorthalidone (HYGROTON) 25 MG tablet Take 1 tablet by mouth once daily.   Cholecalciferol (VITAMIN D-3 PO) Take 1 tablet by mouth daily.   Coenzyme Q10 (CO Q 10 PO) Take 1 tablet by mouth 2 (two) times daily.   diltiazem (CARDIZEM CD) 120 MG 24 hr capsule Take 1 capsule (120 mg total) by mouth daily.   folic acid (FOLVITE) 1 MG tablet Take 1 mg by mouth daily.   gabapentin  (NEURONTIN) 300 MG capsule Take 1 capsule (300 mg total) by mouth as directed. Take one tablet by mouth in the morning, and take 3 tablets by mouth in the evening.   labetalol (NORMODYNE) 200 MG tablet Take 1 tablet (200 mg total) by mouth 2 (two) times daily.   Magnesium Gluconate (MAGNESIUM 27 PO) Take 1 tablet by mouth daily.   montelukast (SINGULAIR) 10 MG tablet Take 1 tablet by mouth daily.   potassium chloride SA (KLOR-CON M) 20 MEQ tablet Take 1 tablet (20 mEq total) by mouth 2 (two) times daily. (Patient taking differently: Take 10 mEq by mouth 2 (two) times daily.)   rosuvastatin (CRESTOR) 20 MG tablet Take 20 mg by mouth daily.   XARELTO 20 MG TABS tablet Take 1 tablet by mouth daily with supper (Patient taking differently: Take 20 mg by mouth daily with supper. Take 1 tablet by mouth daily with supper)   Zinc 50 MG TABS Take 1 tablet by mouth daily.     Allergies:   Patient has no known allergies.   Social History   Socioeconomic History   Marital status: Married    Spouse name: Not on file   Number of children: Not on file   Years of education: Not on file   Highest education level: Not on file  Occupational  History   Not on file  Tobacco Use   Smoking status: Former    Current packs/day: 0.00    Types: Cigarettes    Quit date: 06/26/1978    Years since quitting: 45.1   Smokeless tobacco: Never  Vaping Use   Vaping status: Never Used  Substance and Sexual Activity   Alcohol use: No   Drug use: No   Sexual activity: Yes    Partners: Female    Birth control/protection: None  Other Topics Concern   Not on file  Social History Narrative   ** Merged History Encounter **       Social Drivers of Health   Financial Resource Strain: Low Risk  (08/17/2017)   Overall Financial Resource Strain (CARDIA)    Difficulty of Paying Living Expenses: Not very hard  Food Insecurity: No Food Insecurity (08/17/2017)   Hunger Vital Sign    Worried About Running Out of Food in the  Last Year: Never true    Ran Out of Food in the Last Year: Never true  Transportation Needs: No Transportation Needs (08/17/2017)   PRAPARE - Administrator, Civil Service (Medical): No    Lack of Transportation (Non-Medical): No  Physical Activity: Inactive (08/17/2017)   Exercise Vital Sign    Days of Exercise per Week: 0 days    Minutes of Exercise per Session: 0 min  Stress: No Stress Concern Present (08/17/2017)   [employer] of Occupational Health - Occupational Stress Questionnaire    Feeling of Stress : Only a little  Social Connections: Unknown (08/17/2017)   Social Connection and Isolation Panel [NHANES]    Frequency of Communication with Friends and Family: Patient declined    Frequency of Social Gatherings with Friends and Family: Patient declined    Attends Religious Services: Patient declined    Database administrator or Organizations: Patient declined    Attends Engineer, structural: Patient declined    Marital Status: Patient declined     Family History: The patient's family history includes Atrial fibrillation in his child; Coronary artery disease in his mother; Heart disease in his father and mother; Hypertension in his mother and sister; Stroke in his mother. ROS:   Please see the history of present illness.    All 14 point review of systems negative except as described per history of present illness  EKGs/Labs/Other Studies Reviewed:         Recent Labs: 02/02/2023: ALT 25; BUN 16; Creatinine, Ser 1.03; Potassium 3.2; Sodium 145  Recent Lipid Panel    Component Value Date/Time   CHOL 117 02/02/2023 1038   TRIG 114 02/02/2023 1038   HDL 37 (L) 02/02/2023 1038   CHOLHDL 3.2 02/02/2023 1038   CHOLHDL 5.1 08/18/2017 0333   VLDL 32 08/18/2017 0333   LDLCALC 59 02/02/2023 1038    Physical Exam:    VS:  BP 120/76 (BP Location: Right Arm, Patient Position: Sitting)   Pulse 63   Ht 6' (1.829 m)   Wt 176 lb 6.4 oz (80 kg)   SpO2 91%    BMI 23.92 kg/m     Wt Readings from Last 3 Encounters:  08/17/23 176 lb 6.4 oz (80 kg)  02/02/23 177 lb 6.4 oz (80.5 kg)  05/01/22 185 lb 6.4 oz (84.1 kg)     GEN:  Well nourished, well developed in no acute distress HEENT: Normal NECK: No JVD; No carotid bruits LYMPHATICS: No lymphadenopathy CARDIAC: Irregularly irregular no murmurs, no  rubs, no gallops RESPIRATORY:  Clear to auscultation without rales, wheezing or rhonchi  ABDOMEN: Soft, non-tender, non-distended MUSCULOSKELETAL:  No edema; No deformity  SKIN: Warm and dry LOWER EXTREMITIES: no swelling NEUROLOGIC:  Alert and oriented x 3 PSYCHIATRIC:  Normal affect   ASSESSMENT:    1. Permanent atrial fibrillation (HCC)   2. Ruptured aneurysm of descending thoracic aorta (HCC)   3. Aortic dissection distal to left subclavian (HCC)   4. Rheumatoid arthritis involving multiple sites with positive rheumatoid factor (HCC)   5. Intramural hematoma of descending thoracic and abdominal aorta    PLAN:    In order of problems listed above:  Permanent atrial fibrillation rate is controlled, anticoagulation with Xarelto, he want me to give him some cheaper medication but I told him there is not much alternatives.  He pays $47 for 2 months it is not bad back. Aneurysm of the descending thoracic aorta.  Stable.  Last CT reviewed, will do another CT probably in a year or so. Rheumatoid arthritis will be followed by internal medicine team and rheumatology stable. Intramural hematoma descending thoracic aorta on anticoagulation   Medication Adjustments/Labs and Tests Ordered: Current medicines are reviewed at length with the patient today.  Concerns regarding medicines are outlined above.  No orders of the defined types were placed in this encounter.  Medication changes: No orders of the defined types were placed in this encounter.   Signed, Georgeanna Lea, MD, [name] 08/17/2023 4:27 PM    [facility]

## 2023-08-17 NOTE — Patient Instructions (Signed)

## 2023-08-28 DIAGNOSIS — J40 Bronchitis, not specified as acute or chronic: Secondary | ICD-10-CM | POA: Diagnosis not present

## 2023-08-28 DIAGNOSIS — J329 Chronic sinusitis, unspecified: Secondary | ICD-10-CM | POA: Diagnosis not present

## 2023-08-28 DIAGNOSIS — Z6823 Body mass index (BMI) 23.0-23.9, adult: Secondary | ICD-10-CM | POA: Diagnosis not present

## 2023-08-29 DIAGNOSIS — R1311 Dysphagia, oral phase: Secondary | ICD-10-CM | POA: Diagnosis not present

## 2023-08-29 DIAGNOSIS — Q381 Ankyloglossia: Secondary | ICD-10-CM | POA: Diagnosis not present

## 2023-09-12 DIAGNOSIS — M0579 Rheumatoid arthritis with rheumatoid factor of multiple sites without organ or systems involvement: Secondary | ICD-10-CM | POA: Diagnosis not present

## 2023-10-16 ENCOUNTER — Other Ambulatory Visit: Payer: Self-pay | Admitting: Cardiology

## 2023-10-16 DIAGNOSIS — I4821 Permanent atrial fibrillation: Secondary | ICD-10-CM

## 2023-10-16 NOTE — Telephone Encounter (Signed)
 Prescription refill request for Xarelto  received.  Indication: AF Last office visit: 08/17/23  Otilio Block MD Weight: 80kg Age: 83 Scr: 1.03 on 02/02/23  Epic CrCl: 64.72  Based on above findings Xarelto  20mg  daily is the appropriate dose.  Refill approved.

## 2023-11-19 ENCOUNTER — Other Ambulatory Visit: Payer: Self-pay | Admitting: Podiatry

## 2023-12-13 DIAGNOSIS — Z862 Personal history of diseases of the blood and blood-forming organs and certain disorders involving the immune mechanism: Secondary | ICD-10-CM | POA: Diagnosis not present

## 2023-12-13 DIAGNOSIS — E790 Hyperuricemia without signs of inflammatory arthritis and tophaceous disease: Secondary | ICD-10-CM | POA: Diagnosis not present

## 2023-12-13 DIAGNOSIS — R5382 Chronic fatigue, unspecified: Secondary | ICD-10-CM | POA: Diagnosis not present

## 2023-12-13 DIAGNOSIS — M0579 Rheumatoid arthritis with rheumatoid factor of multiple sites without organ or systems involvement: Secondary | ICD-10-CM | POA: Diagnosis not present

## 2023-12-13 DIAGNOSIS — M792 Neuralgia and neuritis, unspecified: Secondary | ICD-10-CM | POA: Diagnosis not present

## 2023-12-13 DIAGNOSIS — Z6823 Body mass index (BMI) 23.0-23.9, adult: Secondary | ICD-10-CM | POA: Diagnosis not present

## 2024-01-17 DIAGNOSIS — R197 Diarrhea, unspecified: Secondary | ICD-10-CM | POA: Diagnosis not present

## 2024-01-17 DIAGNOSIS — H109 Unspecified conjunctivitis: Secondary | ICD-10-CM | POA: Diagnosis not present

## 2024-01-17 DIAGNOSIS — Z6824 Body mass index (BMI) 24.0-24.9, adult: Secondary | ICD-10-CM | POA: Diagnosis not present

## 2024-01-18 DIAGNOSIS — R197 Diarrhea, unspecified: Secondary | ICD-10-CM | POA: Diagnosis not present

## 2024-01-21 DIAGNOSIS — I10 Essential (primary) hypertension: Secondary | ICD-10-CM | POA: Diagnosis not present

## 2024-02-11 DIAGNOSIS — Z6824 Body mass index (BMI) 24.0-24.9, adult: Secondary | ICD-10-CM | POA: Diagnosis not present

## 2024-02-11 DIAGNOSIS — I10 Essential (primary) hypertension: Secondary | ICD-10-CM | POA: Diagnosis not present

## 2024-02-15 ENCOUNTER — Ambulatory Visit: Admitting: Cardiology

## 2024-02-18 DIAGNOSIS — L821 Other seborrheic keratosis: Secondary | ICD-10-CM | POA: Diagnosis not present

## 2024-02-18 DIAGNOSIS — L570 Actinic keratosis: Secondary | ICD-10-CM | POA: Diagnosis not present

## 2024-02-18 DIAGNOSIS — L578 Other skin changes due to chronic exposure to nonionizing radiation: Secondary | ICD-10-CM | POA: Diagnosis not present

## 2024-02-21 DIAGNOSIS — M79641 Pain in right hand: Secondary | ICD-10-CM | POA: Insufficient documentation

## 2024-02-21 DIAGNOSIS — M79642 Pain in left hand: Secondary | ICD-10-CM | POA: Insufficient documentation

## 2024-02-22 ENCOUNTER — Ambulatory Visit: Attending: Cardiology | Admitting: Cardiology

## 2024-02-22 ENCOUNTER — Encounter: Payer: Self-pay | Admitting: Cardiology

## 2024-02-22 VITALS — BP 96/64 | HR 67 | Ht 72.0 in | Wt 174.4 lb

## 2024-02-22 DIAGNOSIS — I7113 Aneurysm of the descending thoracic aorta, ruptured: Secondary | ICD-10-CM

## 2024-02-22 DIAGNOSIS — I4821 Permanent atrial fibrillation: Secondary | ICD-10-CM

## 2024-02-22 DIAGNOSIS — I10 Essential (primary) hypertension: Secondary | ICD-10-CM

## 2024-02-22 DIAGNOSIS — I71019 Dissection of thoracic aorta, unspecified: Secondary | ICD-10-CM

## 2024-02-22 DIAGNOSIS — E7800 Pure hypercholesterolemia, unspecified: Secondary | ICD-10-CM

## 2024-02-22 DIAGNOSIS — R9431 Abnormal electrocardiogram [ECG] [EKG]: Secondary | ICD-10-CM

## 2024-02-22 NOTE — Progress Notes (Signed)
 Cardiology Office Note:    Date:  02/22/2024   ID:  Jerome Graham Pender., DOB 17-Sep-1940, MRN [id number]  PCP:  Rusty Lonni HERO, MD  Cardiologist:  Lamar Fitch, MD    Referring MD: [address], Lonni HERO, *   No chief complaint on file.   History of Present Illness:    Jerome Delpilar. is a 83 y.o. male past medical history significant for permanent atrial fibrillation he is anticoagulant Xarelto , type B aortic dissection with penetrating ulcer but managed conservatively, essential hypertension, dyslipidemia.  Comes today to months for follow-up for doing well denies of any chest pain tightness squeezing pressure burning chest.  He is noticing to be tired today.  Also his EKG today showed QS in V1 V2 raising suspicion for anteroseptal wall MI.  Will schedule him to have echocardiogram to clarify that  Past Medical History:  Diagnosis Date   A-fib (HCC)    A-fib [facility])    failed cardioversion   Aortic aneurysm (HCC) 08/17/2017   Aortic dissection distal to left subclavian (HCC) 08/17/2017   Atrial fibrillation (HCC) 09/15/2010   HTN (hypertension)    Hypercholesteremia    Hypertension 09/15/2010   Intramural hematoma of descending thoracic and abdominal aorta 08/17/2017    Past Surgical History:  Procedure Laterality Date   BACK SURGERY     cancer removed from his tongue     CARDIOVERSION     HAND SURGERY     HAND SURGERY Right    LASIK     LUMBAR DISC SURGERY      Current Medications: Current Meds  Medication Sig   amitriptyline (ELAVIL) 25 MG tablet Take 12.5 mg by mouth at bedtime.   calcium carbonate (OS-CAL) 600 MG TABS Take 600 mg by mouth 2 (two) times daily.   chlorthalidone  (HYGROTON ) 25 MG tablet Take 1 tablet by mouth once daily.   Cholecalciferol (VITAMIN D-3 PO) Take 1 tablet by mouth daily.   Coenzyme Q10 (CO Q 10 PO) Take 1 tablet by mouth 2 (two) times daily.   diltiazem  (CARDIZEM  CD) 120 MG 24 hr capsule Take 1 capsule (120 mg total) by  mouth daily.   folic acid (FOLVITE) 1 MG tablet Take 1 mg by mouth daily.   gabapentin  (NEURONTIN ) 300 MG capsule Take 1 capsule by mouth every morning and 3 capsules by mouth every evening   labetalol  (NORMODYNE ) 200 MG tablet Take 1 tablet (200 mg total) by mouth 2 (two) times daily.   losartan (COZAAR) 100 MG tablet Take 100 mg by mouth daily.   Magnesium  Gluconate (MAGNESIUM  27 PO) Take 1 tablet by mouth daily.   montelukast (SINGULAIR) 10 MG tablet Take 1 tablet by mouth daily.   potassium chloride  SA (KLOR-CON  M) 20 MEQ tablet Take 1 tablet (20 mEq total) by mouth 2 (two) times daily. (Patient taking differently: Take 10 mEq by mouth 2 (two) times daily.)   rosuvastatin (CRESTOR) 20 MG tablet Take 20 mg by mouth daily.   XARELTO  20 MG TABS tablet Take 1 tablet by mouth daily with supper   Zinc 50 MG TABS Take 1 tablet by mouth daily.     Allergies:   Patient has no known allergies.   Social History   Socioeconomic History   Marital status: Married    Spouse name: Not on file   Number of children: Not on file   Years of education: Not on file   Highest education level: Not on file  Occupational History  Not on file  Tobacco Use   Smoking status: Former    Current packs/day: 0.00    Types: Cigarettes    Quit date: 06/26/1978    Years since quitting: 45.6   Smokeless tobacco: Never  Vaping Use   Vaping status: Never Used  Substance and Sexual Activity   Alcohol use: No   Drug use: No   Sexual activity: Yes    Partners: Female    Birth control/protection: None  Other Topics Concern   Not on file  Social History Narrative   ** Merged History Encounter **       Social Drivers of Health   Financial Resource Strain: Low Risk  (08/17/2017)   Overall Financial Resource Strain (CARDIA)    Difficulty of Paying Living Expenses: Not very hard  Food Insecurity: No Food Insecurity (08/17/2017)   Hunger Vital Sign    Worried About Running Out of Food in the Last Year: Never  true    Ran Out of Food in the Last Year: Never true  Transportation Needs: No Transportation Needs (08/17/2017)   PRAPARE - Administrator, Civil Service (Medical): No    Lack of Transportation (Non-Medical): No  Physical Activity: Inactive (08/17/2017)   Exercise Vital Sign    Days of Exercise per Week: 0 days    Minutes of Exercise per Session: 0 min  Stress: No Stress Concern Present (08/17/2017)   [employer] of Occupational Health - Occupational Stress Questionnaire    Feeling of Stress : Only a little  Social Connections: Unknown (08/17/2017)   Social Connection and Isolation Panel    Frequency of Communication with Friends and Family: Patient declined    Frequency of Social Gatherings with Friends and Family: Patient declined    Attends Religious Services: Patient declined    Database administrator or Organizations: Patient declined    Attends Engineer, structural: Patient declined    Marital Status: Patient declined     Family History: The patient's family history includes Atrial fibrillation in his child; Coronary artery disease in his mother; Heart disease in his father and mother; Hypertension in his mother and sister; Stroke in his mother. ROS:   Please see the history of present illness.    All 14 point review of systems negative except as described per history of present illness  EKGs/Labs/Other Studies Reviewed:    EKG Interpretation Date/Time:  Friday February 22 2024 12:47:35 EDT Ventricular Rate:  67 PR Interval:    QRS Duration:  94 QT Interval:  408 QTC Calculation: 431 R Axis:   25  Text Interpretation: Atrial fibrillation Anteroseptal infarct , age undetermined ST & T wave abnormality, consider lateral ischemia When compared with ECG of 02-Feb-2023 09:45, Anteroseptal infarct is now Present Nonspecific T wave abnormality now evident in Inferior leads Confirmed by Bernie Charleston [phone]) on 02/22/2024 1:16:46 PM    Recent Labs: No  results found for requested labs within last 365 days.  Recent Lipid Panel    Component Value Date/Time   CHOL 117 02/02/2023 1038   TRIG 114 02/02/2023 1038   HDL 37 (L) 02/02/2023 1038   CHOLHDL 3.2 02/02/2023 1038   CHOLHDL 5.1 08/18/2017 0333   VLDL 32 08/18/2017 0333   LDLCALC 59 02/02/2023 1038    Physical Exam:    VS:  BP 96/64   Pulse 67   Ht 6' (1.829 m)   Wt 174 lb 6.4 oz (79.1 kg)   SpO2 96%  BMI 23.65 kg/m     Wt Readings from Last 3 Encounters:  02/22/24 174 lb 6.4 oz (79.1 kg)  08/17/23 176 lb 6.4 oz (80 kg)  02/02/23 177 lb 6.4 oz (80.5 kg)     GEN:  Well nourished, well developed in no acute distress HEENT: Normal NECK: No JVD; No carotid bruits LYMPHATICS: No lymphadenopathy CARDIAC: RRR, no murmurs, no rubs, no gallops RESPIRATORY:  Clear to auscultation without rales, wheezing or rhonchi  ABDOMEN: Soft, non-tender, non-distended MUSCULOSKELETAL:  No edema; No deformity  SKIN: Warm and dry LOWER EXTREMITIES: no swelling NEUROLOGIC:  Alert and oriented x 3 PSYCHIATRIC:  Normal affect   ASSESSMENT:    1. Primary hypertension   2. Ruptured aneurysm of descending thoracic aorta (HCC)   3. Permanent atrial fibrillation (HCC)   4. Aortic dissection distal to left subclavian (HCC)   5. Hypercholesteremia    PLAN:    In order of problems listed above:  Essential hypertension blood pressure high variation between the morning and in the afternoon.  I ask him to check blood pressure on different way he wakes up and check blood pressure tells him not to do that I want him to check blood pressure maybe half an hour 45 minutes after he wakes up also for next 2 weeks check blood pressure at different day different time of the day and bring it results to me.  I am worried that his blood pressure may be very low at evening time and too high in the morning. Aortic aneurysm.  Last CT review we will schedule him to have another CTA in September. Permanent atrial  fibrillation, rate controlled, anticoagulated continue. Q wave which is new in V1 V2.  Will do echocardiogram. Dyslipidemia I did review K PN which show LDL 59 HDL 26 continue present   Medication Adjustments/Labs and Tests Ordered: Current medicines are reviewed at length with the patient today.  Concerns regarding medicines are outlined above.  Orders Placed This Encounter  Procedures   EKG 12-Lead   Medication changes: No orders of the defined types were placed in this encounter.   Signed, Lamar DOROTHA Fitch, MD, [facility] 02/22/2024 1:28 PM    [facility]

## 2024-02-22 NOTE — Patient Instructions (Addendum)
 Medication Instructions:  Your physician recommends that you continue on your current medications as directed. Please refer to the Current Medication list given to you today.  *If you need a refill on your cardiac medications before your next appointment, please call your pharmacy*   Lab Work: None Ordered If you have labs (blood work) drawn today and your tests are completely normal, you will receive your results only by: MyChart Message (if you have MyChart) OR A paper copy in the mail If you have any lab test that is abnormal or we need to change your treatment, we will call you to review the results.   Testing/Procedures: Non-Cardiac CT Angiography (CTA), is a special type of CT scan that uses a computer to produce multi-dimensional views of major blood vessels throughout the body. In CT angiography, a contrast material is injected through an IV to help visualize the blood vessels   [facility] [address] [place], [place] [address] [phone]  Your physician has requested that you have an echocardiogram. Echocardiography is a painless test that uses sound waves to create images of your heart. It provides your doctor with information about the size and shape of your heart and how well your heart's chambers and valves are working. This procedure takes approximately one hour. There are no restrictions for this procedure. Please do NOT wear cologne, perfume, aftershave, or lotions (deodorant is allowed). Please arrive 15 minutes prior to your appointment time.  Please note: We ask at that you not bring children with you during ultrasound (echo/ vascular) testing. Due to room size and safety concerns, children are not allowed in the ultrasound rooms during exams. Our front office staff cannot provide observation of children in our lobby area while testing is being conducted. An adult accompanying a patient to their appointment will only be allowed in the ultrasound room at the  discretion of the ultrasound technician under special circumstances. We apologize for any inconvenience.    Follow-Up: At [facility] you and your health needs are our priority.  As part of our continuing mission to provide you with exceptional heart care, we have created designated Provider Care Teams.  These Care Teams include your primary Cardiologist (physician) and Advanced Practice Providers (APPs -  Physician Assistants and Nurse Practitioners) who all work together to provide you with the care you need, when you need it.  We recommend signing up for the patient portal called MyChart.  Sign up information is provided on this After Visit Summary.  MyChart is used to connect with patients for Virtual Visits (Telemedicine).  Patients are able to view lab/test results, encounter notes, upcoming appointments, etc.  Non-urgent messages can be sent to your provider as well.   To learn more about what you can do with MyChart, go to [email or web address].    Your next appointment:   6 month(s)  The format for your next appointment:   In Person  Provider:   Lamar Fitch, MD    Other Instructions NA

## 2024-02-26 DIAGNOSIS — I1310 Hypertensive heart and chronic kidney disease without heart failure, with stage 1 through stage 4 chronic kidney disease, or unspecified chronic kidney disease: Secondary | ICD-10-CM | POA: Diagnosis not present

## 2024-02-26 DIAGNOSIS — I7103 Dissection of thoracoabdominal aorta: Secondary | ICD-10-CM | POA: Diagnosis not present

## 2024-02-26 DIAGNOSIS — N1831 Chronic kidney disease, stage 3a: Secondary | ICD-10-CM | POA: Diagnosis not present

## 2024-02-26 DIAGNOSIS — Z6823 Body mass index (BMI) 23.0-23.9, adult: Secondary | ICD-10-CM | POA: Diagnosis not present

## 2024-02-26 DIAGNOSIS — I482 Chronic atrial fibrillation, unspecified: Secondary | ICD-10-CM | POA: Diagnosis not present

## 2024-02-26 DIAGNOSIS — D6869 Other thrombophilia: Secondary | ICD-10-CM | POA: Diagnosis not present

## 2024-03-17 DIAGNOSIS — C029 Malignant neoplasm of tongue, unspecified: Secondary | ICD-10-CM | POA: Diagnosis not present

## 2024-03-17 DIAGNOSIS — Q381 Ankyloglossia: Secondary | ICD-10-CM | POA: Diagnosis not present

## 2024-03-20 ENCOUNTER — Ambulatory Visit: Attending: Cardiology

## 2024-03-20 DIAGNOSIS — R9431 Abnormal electrocardiogram [ECG] [EKG]: Secondary | ICD-10-CM

## 2024-03-24 LAB — ECHOCARDIOGRAM COMPLETE
Area-P 1/2: 2.84 cm2
MV M vel: 2.94 m/s
MV Peak grad: 34.6 mmHg
S' Lateral: 2.7 cm

## 2024-03-26 ENCOUNTER — Ambulatory Visit: Payer: Self-pay | Admitting: Cardiology

## 2024-03-27 ENCOUNTER — Other Ambulatory Visit (HOSPITAL_BASED_OUTPATIENT_CLINIC_OR_DEPARTMENT_OTHER): Admitting: Radiology

## 2024-04-03 ENCOUNTER — Other Ambulatory Visit (HOSPITAL_BASED_OUTPATIENT_CLINIC_OR_DEPARTMENT_OTHER): Admitting: Radiology

## 2024-04-03 ENCOUNTER — Ambulatory Visit (INDEPENDENT_AMBULATORY_CARE_PROVIDER_SITE_OTHER)
Admission: RE | Admit: 2024-04-03 | Discharge: 2024-04-03 | Disposition: A | Discharge status: Home / Self Care | Source: Ambulatory Visit | Attending: Cardiology | Admitting: Cardiology

## 2024-04-03 DIAGNOSIS — I71019 Dissection of thoracic aorta, unspecified: Secondary | ICD-10-CM

## 2024-04-03 DIAGNOSIS — K5730 Diverticulosis of large intestine without perforation or abscess without bleeding: Secondary | ICD-10-CM | POA: Diagnosis not present

## 2024-04-03 DIAGNOSIS — C760 Malignant neoplasm of head, face and neck: Secondary | ICD-10-CM | POA: Diagnosis not present

## 2024-04-03 DIAGNOSIS — Q600 Renal agenesis, unilateral: Secondary | ICD-10-CM | POA: Diagnosis not present

## 2024-04-03 DIAGNOSIS — R918 Other nonspecific abnormal finding of lung field: Secondary | ICD-10-CM | POA: Diagnosis not present

## 2024-04-03 MED ORDER — IOHEXOL 350 MG/ML SOLN
100.0000 mL | Freq: Once | INTRAVENOUS | Status: AC | PRN
  Administered 2024-04-03: 100 mL via INTRAVENOUS

## 2024-04-10 DIAGNOSIS — M79671 Pain in right foot: Secondary | ICD-10-CM | POA: Diagnosis not present

## 2024-04-10 DIAGNOSIS — Z23 Encounter for immunization: Secondary | ICD-10-CM | POA: Diagnosis not present

## 2024-04-10 DIAGNOSIS — R0602 Shortness of breath: Secondary | ICD-10-CM | POA: Diagnosis not present

## 2024-04-10 DIAGNOSIS — M79602 Pain in left arm: Secondary | ICD-10-CM | POA: Diagnosis not present

## 2024-04-10 DIAGNOSIS — Z6823 Body mass index (BMI) 23.0-23.9, adult: Secondary | ICD-10-CM | POA: Diagnosis not present

## 2024-04-10 DIAGNOSIS — M79601 Pain in right arm: Secondary | ICD-10-CM | POA: Diagnosis not present

## 2024-04-18 DIAGNOSIS — M79671 Pain in right foot: Secondary | ICD-10-CM | POA: Diagnosis not present

## 2024-04-18 DIAGNOSIS — R0602 Shortness of breath: Secondary | ICD-10-CM | POA: Diagnosis not present

## 2024-04-18 DIAGNOSIS — Z6822 Body mass index (BMI) 22.0-22.9, adult: Secondary | ICD-10-CM | POA: Diagnosis not present

## 2024-04-18 DIAGNOSIS — M79602 Pain in left arm: Secondary | ICD-10-CM | POA: Diagnosis not present

## 2024-04-18 DIAGNOSIS — M79601 Pain in right arm: Secondary | ICD-10-CM | POA: Diagnosis not present

## 2024-04-18 DIAGNOSIS — R6883 Chills (without fever): Secondary | ICD-10-CM | POA: Diagnosis not present

## 2024-04-22 ENCOUNTER — Other Ambulatory Visit: Payer: Self-pay | Admitting: Cardiology

## 2024-04-22 DIAGNOSIS — I4821 Permanent atrial fibrillation: Secondary | ICD-10-CM

## 2024-04-22 NOTE — Telephone Encounter (Signed)
 Prescription refill request for Xarelto  received.  Indication:afib Last office visit:8/25 Weight:79.1  kg Age:83 Scr:1.02  2025 CrCl:63.55  ml/min  Prescription refilled

## 2024-04-23 ENCOUNTER — Telehealth: Payer: Self-pay

## 2024-04-23 DIAGNOSIS — Z6822 Body mass index (BMI) 22.0-22.9, adult: Secondary | ICD-10-CM | POA: Diagnosis not present

## 2024-04-23 DIAGNOSIS — R0602 Shortness of breath: Secondary | ICD-10-CM | POA: Diagnosis not present

## 2024-04-23 NOTE — Telephone Encounter (Signed)
CT AngioResults reviewed with pt as per Dr. Krasowski's note.  Pt verbalized understanding and had no additional questions. Routed to PCP  

## 2024-04-23 NOTE — Telephone Encounter (Signed)
 Echo Results reviewed with pt as per Dr. Vanetta Shawl note.  Pt verbalized understanding and had no additional questions. Routed to PCP

## 2024-05-04 ENCOUNTER — Encounter (HOSPITAL_COMMUNITY): Payer: Self-pay | Admitting: Emergency Medicine

## 2024-05-04 ENCOUNTER — Emergency Department (HOSPITAL_COMMUNITY)
Admission: EM | Admit: 2024-05-04 | Discharge: 2024-05-04 | Disposition: A | Discharge status: Home / Self Care | Attending: Emergency Medicine | Admitting: Emergency Medicine

## 2024-05-04 ENCOUNTER — Other Ambulatory Visit: Payer: Self-pay

## 2024-05-04 ENCOUNTER — Emergency Department (HOSPITAL_COMMUNITY)

## 2024-05-04 DIAGNOSIS — R630 Anorexia: Secondary | ICD-10-CM | POA: Diagnosis not present

## 2024-05-04 DIAGNOSIS — R1030 Lower abdominal pain, unspecified: Secondary | ICD-10-CM | POA: Diagnosis present

## 2024-05-04 DIAGNOSIS — I4891 Unspecified atrial fibrillation: Secondary | ICD-10-CM | POA: Insufficient documentation

## 2024-05-04 DIAGNOSIS — Z79899 Other long term (current) drug therapy: Secondary | ICD-10-CM | POA: Diagnosis not present

## 2024-05-04 DIAGNOSIS — Z7901 Long term (current) use of anticoagulants: Secondary | ICD-10-CM | POA: Diagnosis not present

## 2024-05-04 DIAGNOSIS — I10 Essential (primary) hypertension: Secondary | ICD-10-CM | POA: Insufficient documentation

## 2024-05-04 DIAGNOSIS — Z85828 Personal history of other malignant neoplasm of skin: Secondary | ICD-10-CM | POA: Insufficient documentation

## 2024-05-04 DIAGNOSIS — E876 Hypokalemia: Secondary | ICD-10-CM | POA: Diagnosis not present

## 2024-05-04 LAB — CBC
HCT: 35.7 % — ABNORMAL LOW (ref 39.0–52.0)
Hemoglobin: 11.9 g/dL — ABNORMAL LOW (ref 13.0–17.0)
MCH: 27 pg (ref 26.0–34.0)
MCHC: 33.3 g/dL (ref 30.0–36.0)
MCV: 81.1 fL (ref 80.0–100.0)
Platelets: 231 K/uL (ref 150–400)
RBC: 4.4 MIL/uL (ref 4.22–5.81)
RDW: 14.6 % (ref 11.5–15.5)
WBC: 9 K/uL (ref 4.0–10.5)
nRBC: 0 % (ref 0.0–0.2)

## 2024-05-04 LAB — URINALYSIS, ROUTINE W REFLEX MICROSCOPIC
Bilirubin Urine: NEGATIVE
Glucose, UA: NEGATIVE mg/dL
Hgb urine dipstick: NEGATIVE
Ketones, ur: NEGATIVE mg/dL
Leukocytes,Ua: NEGATIVE
Nitrite: NEGATIVE
Protein, ur: NEGATIVE mg/dL
Specific Gravity, Urine: 1.023 (ref 1.005–1.030)
pH: 5 (ref 5.0–8.0)

## 2024-05-04 LAB — COMPREHENSIVE METABOLIC PANEL WITH GFR
ALT: 19 U/L (ref 0–44)
AST: 24 U/L (ref 15–41)
Albumin: 2.4 g/dL — ABNORMAL LOW (ref 3.5–5.0)
Alkaline Phosphatase: 37 U/L — ABNORMAL LOW (ref 38–126)
Anion gap: 14 (ref 5–15)
BUN: 24 mg/dL — ABNORMAL HIGH (ref 8–23)
CO2: 26 mmol/L (ref 22–32)
Calcium: 8.8 mg/dL — ABNORMAL LOW (ref 8.9–10.3)
Chloride: 95 mmol/L — ABNORMAL LOW (ref 98–111)
Creatinine, Ser: 1.24 mg/dL (ref 0.61–1.24)
GFR, Estimated: 58 mL/min — ABNORMAL LOW (ref >60)
Glucose, Bld: 104 mg/dL — ABNORMAL HIGH (ref 70–99)
Potassium: 2.9 mmol/L — ABNORMAL LOW (ref 3.5–5.1)
Sodium: 135 mmol/L (ref 135–145)
Total Bilirubin: 1.1 mg/dL (ref 0.0–1.2)
Total Protein: 5.5 g/dL — ABNORMAL LOW (ref 6.5–8.1)

## 2024-05-04 LAB — MAGNESIUM: Magnesium: 1.2 mg/dL — ABNORMAL LOW (ref 1.7–2.4)

## 2024-05-04 LAB — LIPASE, BLOOD: Lipase: 20 U/L (ref 11–51)

## 2024-05-04 LAB — TROPONIN I (HIGH SENSITIVITY)
Troponin I (High Sensitivity): 9 ng/L (ref <18)
Troponin I (High Sensitivity): 10 ng/L (ref <18)

## 2024-05-04 MED ORDER — MAGNESIUM SULFATE 2 GM/50ML IV SOLN: 2.0000 g | Freq: Once | INTRAVENOUS | Status: DC

## 2024-05-04 MED ORDER — SODIUM CHLORIDE 0.9 % IV BOLUS
500.0000 mL | Freq: Once | INTRAVENOUS | Status: AC
Start: 2024-05-04 — End: 2024-05-04
  Administered 2024-05-04: 500 mL via INTRAVENOUS

## 2024-05-04 MED ORDER — POTASSIUM CHLORIDE CRYS ER 20 MEQ PO TBCR
40.0000 meq | EXTENDED_RELEASE_TABLET | Freq: Once | ORAL | Status: AC
  Administered 2024-05-04: 40 meq via ORAL
  Filled 2024-05-04: qty 2

## 2024-05-04 MED ORDER — POTASSIUM CHLORIDE 10 MEQ/100ML IV SOLN
10.0000 meq | Freq: Once | INTRAVENOUS | Status: AC
  Administered 2024-05-04: 10 meq via INTRAVENOUS
  Filled 2024-05-04: qty 100

## 2024-05-04 MED ORDER — MAGNESIUM SULFATE IN D5W 1-5 GM/100ML-% IV SOLN
1.0000 g | Freq: Once | INTRAVENOUS | Status: AC
  Administered 2024-05-04: 1 g via INTRAVENOUS
  Filled 2024-05-04: qty 100

## 2024-05-04 MED ORDER — IOHEXOL 350 MG/ML SOLN
75.0000 mL | Freq: Once | INTRAVENOUS | Status: AC | PRN
  Administered 2024-05-04: 75 mL via INTRAVENOUS

## 2024-05-04 NOTE — ED Triage Notes (Addendum)
 Pt presents for 1/10 Abd pain x a couple months.  Does not endorse any NVD at this time.  He has seen PCP but it isnt getting better.  States he has had a CT scan. Abnormal results on 10/12. Also endorses a PNA dx a couple of years ago. Does not appear SOB at this time.

## 2024-05-04 NOTE — ED Provider Notes (Signed)
 [place] EMERGENCY DEPARTMENT AT [facility] Provider Note   CSN: [id number] Arrival date & time: 05/04/24  1015     Patient presents with: Abdominal Pain   Jerome Sullivan. is a 83 y.o. male.   Patient is a 83 year old male who presents with weakness and weight loss.  He has a history of a prior thoracic aortic dissection, type B that was managed conservatively in 2019.  He is followed by cardiology.  He also has a history of atrial fibrillation on Xarelto , hypertension and prior squamous cell carcinoma of the tongue that was treated in 2023.  He said that over the last 4 to 5 weeks he has had progressive weight loss as well as general weakness.  He does not have much energy.  He has had a decreased appetite.  He has had some shortness of breath with exertion as well.  No associated chest pain.  He has had a little bit of a coughing and was recently treated 2 weeks ago for pneumonia by his PCP with doxycycline and prednisone .  He completed his antibiotics yesterday.  His family members at bedside said he has had ongoing decline and is not really eating or drinking much at home and they are concerned about his decline.  He has also had some ongoing pain across his lower abdomen which he says has been worsening over the last few weeks.  He had a recent CT chest abdomen pelvis on October 12 that showed his unchanged thoracic dissection and ectasia in his infrarenal aorta with an ulceration that per cardiology notes appears to be chronic.  There is also multiple scattered nodules in the lungs of unclear etiology.  He has not yet been able to follow back up with his oncologist who was in [place].       Prior to Admission medications   Medication Sig Start Date End Date Taking? Authorizing Provider  amitriptyline (ELAVIL) 25 MG tablet Take 12.5 mg by mouth at bedtime.    [provider]  calcium carbonate (OS-CAL) 600 MG TABS Take 600 mg by mouth 2 (two) times daily.     [provider]  chlorthalidone  (HYGROTON ) 25 MG tablet Take 1 tablet by mouth once daily. 04/11/23   Krasowski, Robert J, MD  Cholecalciferol (VITAMIN D-3 PO) Take 1 tablet by mouth daily.    [provider]  Coenzyme Q10 (CO Q 10 PO) Take 1 tablet by mouth 2 (two) times daily.    [provider]  diltiazem  (CARDIZEM  CD) 120 MG 24 hr capsule Take 1 capsule (120 mg total) by mouth daily. 08/23/17   Danton Reyes DASEN, MD  folic acid (FOLVITE) 1 MG tablet Take 1 mg by mouth daily.    [provider]  gabapentin  (NEURONTIN ) 300 MG capsule Take 1 capsule by mouth every morning and 3 capsules by mouth every evening 11/19/23   McCaughan, Dia D, DPM  labetalol  (NORMODYNE ) 200 MG tablet Take 1 tablet (200 mg total) by mouth 2 (two) times daily. 08/22/17   Danton Reyes DASEN, MD  losartan (COZAAR) 100 MG tablet Take 100 mg by mouth daily. 02/04/24   [provider]  Magnesium  Gluconate (MAGNESIUM  27 PO) Take 1 tablet by mouth daily.    [provider]  montelukast (SINGULAIR) 10 MG tablet Take 1 tablet by mouth daily. 07/29/20   [provider]  potassium chloride  SA (KLOR-CON  M) 20 MEQ tablet Take 1 tablet (20 mEq total) by mouth 2 (two) times  daily. Patient taking differently: Take 10 mEq by mouth 2 (two) times daily. 12/05/21   Krasowski, Robert J, MD  rosuvastatin (CRESTOR) 20 MG tablet Take 20 mg by mouth daily. 08/03/16   [provider]  XARELTO  20 MG TABS tablet Take 1 tablet by mouth daily with supper 04/22/24   Krasowski, Robert J, MD  Zinc 50 MG TABS Take 1 tablet by mouth daily.    [provider]    Allergies: Patient has no known allergies.    Review of Systems  Constitutional:  Positive for appetite change, fatigue and unexpected weight change. Negative for chills, diaphoresis and fever.  HENT:  Negative for congestion, rhinorrhea and sneezing.   Eyes: Negative.   Respiratory:  Positive for cough and shortness of  breath. Negative for chest tightness.   Cardiovascular:  Negative for chest pain and leg swelling.  Gastrointestinal:  Positive for abdominal pain. Negative for blood in stool, diarrhea, nausea and vomiting.  Genitourinary:  Negative for difficulty urinating, flank pain, frequency and hematuria.  Musculoskeletal:  Negative for arthralgias and back pain.  Skin:  Negative for rash.  Neurological:  Positive for weakness (Generalized). Negative for dizziness, speech difficulty, numbness and headaches.    Updated Vital Signs BP (!) 156/104   Pulse 70   Temp 98.6 F (37 C) (Oral)   Resp 13   SpO2 93%   Physical Exam Constitutional:      Appearance: He is well-developed.     Comments: Thin with some muscle wasting  HENT:     Head: Normocephalic and atraumatic.  Eyes:     Pupils: Pupils are equal, round, and reactive to light.  Cardiovascular:     Rate and Rhythm: Normal rate and regular rhythm.     Heart sounds: Normal heart sounds.  Pulmonary:     Effort: Pulmonary effort is normal. No respiratory distress.     Breath sounds: Normal breath sounds. No wheezing or rales.  Chest:     Chest wall: No tenderness.  Abdominal:     General: Bowel sounds are normal.     Palpations: Abdomen is soft.     Tenderness: There is abdominal tenderness (Mild tenderness across the lower abdomen bilaterally). There is no guarding or rebound.  Musculoskeletal:        General: Normal range of motion.     Cervical back: Normal range of motion and neck supple.     Comments: No edema or calf tenderness  Lymphadenopathy:     Cervical: No cervical adenopathy.  Skin:    General: Skin is warm and dry.     Findings: No rash.  Neurological:     General: No focal deficit present.     Mental Status: He is alert and oriented to person, place, and time.     (all labs ordered are listed, but only abnormal results are displayed) Labs Reviewed  COMPREHENSIVE METABOLIC PANEL WITH GFR - Abnormal; Notable for  the following components:      Result Value   Potassium 2.9 (*)    Chloride 95 (*)    Glucose, Bld 104 (*)    BUN 24 (*)    Calcium 8.8 (*)    Total Protein 5.5 (*)    Albumin 2.4 (*)    Alkaline Phosphatase 37 (*)    GFR, Estimated 58 (*)    All other components within normal limits  CBC - Abnormal; Notable for the following components:   Hemoglobin 11.9 (*)    HCT  35.7 (*)    All other components within normal limits  MAGNESIUM  - Abnormal; Notable for the following components:   Magnesium  1.2 (*)    All other components within normal limits  LIPASE, BLOOD  URINALYSIS, ROUTINE W REFLEX MICROSCOPIC  TROPONIN I (HIGH SENSITIVITY)  TROPONIN I (HIGH SENSITIVITY)    EKG: EKG Interpretation Date/Time:  Sunday May 04 2024 12:05:05 EST Ventricular Rate:  54 PR Interval:    QRS Duration:  104 QT Interval:  411 QTC Calculation: 390 R Axis:   52  Text Interpretation: Atrial fibrillation LVH with secondary repolarization abnormality Confirmed by Lenor Hollering [phone]) on 05/04/2024 12:14:56 PM  Radiology: CT Angio Chest PE W and/or Wo Contrast Result Date: 05/04/2024 EXAM: CTA CHEST PE  WITH CONTRAST CT ABDOMEN AND PELVIS WITH CONTRAST 05/04/2024 01:16:10 PM TECHNIQUE: CTA of the chest was performed after the administration of 75 mL of iohexol  (OMNIPAQUE ) 350 MG/ML injection. Multiplanar reformatted images are provided for review. MIP images are provided for review. CT of the abdomen and pelvis was performed with the administration of 75 mL of iohexol  (OMNIPAQUE ) 350 MG/ML injection. Automated exposure control, iterative reconstruction, and/or weight based adjustment of the mA/kV was utilized to reduce the radiation dose to as low as reasonably achievable. COMPARISON: CT 04/03/2024. CLINICAL HISTORY: Pulmonary embolism (PE) suspected, high prob. Abdominal pain. FINDINGS: CHEST: PULMONARY ARTERIES: Pulmonary arteries are adequately opacified for evaluation. No intraluminal filling  defect to suggest pulmonary embolism. Main pulmonary artery is normal in caliber. MEDIASTINUM: No mediastinal lymphadenopathy. The heart and pericardium demonstrate no acute abnormality. Coronary artery and aortic atherosclerotic calcification. There is no acute abnormality of the thoracic aorta. LUNGS AND PLEURA: Basilar predominant fibrotic scarring. The previous scattered ground-glass nodules have resolved. No focal consolidation or pulmonary edema. No pleural effusion or pneumothorax. SOFT TISSUES AND BONES: No acute bone or soft tissue abnormality. ABDOMEN AND PELVIS: LIVER: The liver is unremarkable. GALLBLADDER AND BILE DUCTS: Gallbladder is unremarkable. No biliary ductal dilatation. SPLEEN: Spleen demonstrates no acute abnormality. PANCREAS: Pancreas demonstrates no acute abnormality. ADRENAL GLANDS: Adrenal glands demonstrate no acute abnormality. KIDNEYS, URETERS AND BLADDER: No stones in the kidneys or ureters. No hydronephrosis. No perinephric or periureteral stranding. Urinary bladder is unremarkable. GI AND BOWEL: Stomach and duodenal sweep demonstrate no acute abnormality. Normal appendix. There is no bowel obstruction. No abnormal bowel wall thickening or distension. REPRODUCTIVE: Reproductive organs are unremarkable. PERITONEUM AND RETROPERITONEUM: 3.0 cm infrarenal abdominal aortic aneurysm. No ascites or free air. LYMPH NODES: No lymphadenopathy. BONES AND SOFT TISSUES: No acute abnormality of the visualized bones. No focal soft tissue abnormality. IMPRESSION: 1. No evidence of pulmonary embolism. 2. No acute abnormality in the abdomen or pelvis. 3. 3.0 cm infrarenal abdominal aortic aneurysm. Recommend ultrasound follow-up in 3 years per Society for Vascular Surgery guidelines. Electronically signed by: Norman Gatlin MD 05/04/2024 01:54 PM EST RP Workstation: [id number]   CT ABDOMEN PELVIS W CONTRAST Result Date: 05/04/2024 EXAM: CTA CHEST PE  WITH CONTRAST CT ABDOMEN AND PELVIS WITH  CONTRAST 05/04/2024 01:16:10 PM TECHNIQUE: CTA of the chest was performed after the administration of 75 mL of iohexol  (OMNIPAQUE ) 350 MG/ML injection. Multiplanar reformatted images are provided for review. MIP images are provided for review. CT of the abdomen and pelvis was performed with the administration of 75 mL of iohexol  (OMNIPAQUE ) 350 MG/ML injection. Automated exposure control, iterative reconstruction, and/or weight based adjustment of the mA/kV was utilized to reduce the radiation dose to as low as reasonably achievable. COMPARISON: CT 04/03/2024.  CLINICAL HISTORY: Pulmonary embolism (PE) suspected, high prob. Abdominal pain. FINDINGS: CHEST: PULMONARY ARTERIES: Pulmonary arteries are adequately opacified for evaluation. No intraluminal filling defect to suggest pulmonary embolism. Main pulmonary artery is normal in caliber. MEDIASTINUM: No mediastinal lymphadenopathy. The heart and pericardium demonstrate no acute abnormality. Coronary artery and aortic atherosclerotic calcification. There is no acute abnormality of the thoracic aorta. LUNGS AND PLEURA: Basilar predominant fibrotic scarring. The previous scattered ground-glass nodules have resolved. No focal consolidation or pulmonary edema. No pleural effusion or pneumothorax. SOFT TISSUES AND BONES: No acute bone or soft tissue abnormality. ABDOMEN AND PELVIS: LIVER: The liver is unremarkable. GALLBLADDER AND BILE DUCTS: Gallbladder is unremarkable. No biliary ductal dilatation. SPLEEN: Spleen demonstrates no acute abnormality. PANCREAS: Pancreas demonstrates no acute abnormality. ADRENAL GLANDS: Adrenal glands demonstrate no acute abnormality. KIDNEYS, URETERS AND BLADDER: No stones in the kidneys or ureters. No hydronephrosis. No perinephric or periureteral stranding. Urinary bladder is unremarkable. GI AND BOWEL: Stomach and duodenal sweep demonstrate no acute abnormality. Normal appendix. There is no bowel obstruction. No abnormal bowel wall  thickening or distension. REPRODUCTIVE: Reproductive organs are unremarkable. PERITONEUM AND RETROPERITONEUM: 3.0 cm infrarenal abdominal aortic aneurysm. No ascites or free air. LYMPH NODES: No lymphadenopathy. BONES AND SOFT TISSUES: No acute abnormality of the visualized bones. No focal soft tissue abnormality. IMPRESSION: 1. No evidence of pulmonary embolism. 2. No acute abnormality in the abdomen or pelvis. 3. 3.0 cm infrarenal abdominal aortic aneurysm. Recommend ultrasound follow-up in 3 years per Society for Vascular Surgery guidelines. Electronically signed by: Norman Gatlin MD 05/04/2024 01:54 PM EST RP Workstation: [id number]     Procedures   Medications Ordered in the ED  sodium chloride  0.9 % bolus 500 mL (0 mLs Intravenous Stopped 05/04/24 1438)  potassium chloride  10 mEq in 100 mL IVPB (0 mEq Intravenous Stopped 05/04/24 1418)  potassium chloride  SA (KLOR-CON  M) CR tablet 40 mEq (40 mEq Oral Given 05/04/24 1230)  iohexol  (OMNIPAQUE ) 350 MG/ML injection 75 mL (75 mLs Intravenous Contrast Given 05/04/24 1315)  magnesium  sulfate IVPB 1 g 100 mL (0 g Intravenous Stopped 05/04/24 1655)                                    Medical Decision Making Amount and/or Complexity of Data Reviewed Labs: ordered. Radiology: ordered.  Risk Prescription drug management.   This patient presents to the ED for concern of generalized weakness, this involves an extensive number of treatment options, and is a complaint that carries with it a high risk of complications and morbidity.  I considered the following differential and admission for this acute, potentially life threatening condition.  The differential diagnosis includes anemia, electrolyte abnormality, dehydration, infection, intra-abdominal mass, metastatic disease  MDM:    Patient is a 83 year old male who presents with generalized weakness and weight loss that is been progressing over the last few weeks.  He had a recent CT scan about a  month ago that showed some possible metastatic disease in his lungs with multiple nodules.  He supposed to get what sounds like it PET scan this upcoming week.  He has had ongoing issue with lack of appetite and weight loss for the last several months.  He does not have any focal deficits or concerns for stroke.  His labs show a low potassium level as well as low magnesium .  He was given both potassium and magnesium  replacements.  His EKG is nonconcerning.  He has atrial fibrillation and controlled rate which is chronic for him.  I suspect this is from malnutrition.  He has not had any GI losses obviously.  He was given IV fluids.  He is overall feeling better.  Imaging studies of his CT chest abdomen pelvis show actually resolution of the pulmonary nodularity so that may have been associated with his recent pneumonia.  No other concerning findings on imaging.  He has a stable aortic aneurysm that is being followed as an outpatient.  He was discharged home in good condition.  He was encouraged to have close follow-up with his PCP to have his labs rechecked.  He was recommended to start taking protein shakes.  He is on home potassium and I recommend that he make sure that he is taking that regularly/daily.  He was given return precautions.  (Labs, imaging, consults)  Labs: I Ordered, and personally interpreted labs.  The pertinent results include: Hypokalemia, hypomagnesia  Imaging Studies ordered: I ordered imaging studies including CT chest abdomen pelvis I independently visualized and interpreted imaging. I agree with the radiologist interpretation  Additional history obtained from sons at bedside.  External records from outside source obtained and reviewed including history  Cardiac Monitoring: The patient was maintained on a cardiac monitor.  If on the cardiac monitor, I personally viewed and interpreted the cardiac monitored which showed an underlying rhythm of: Atrial  fibrillation  Reevaluation: After the interventions noted above, I reevaluated the patient and found that they have :improved  Social Determinants of Health:    Disposition: Discharged to home  Co morbidities that complicate the patient evaluation  Past Medical History:  Diagnosis Date   A-fib (HCC)    A-fib (HCC)    failed cardioversion   Aortic aneurysm 08/17/2017   Aortic dissection distal to left subclavian (HCC) 08/17/2017   Atrial fibrillation (HCC) 09/15/2010   HTN (hypertension)    Hypercholesteremia    Hypertension 09/15/2010   Intramural hematoma of descending thoracic and abdominal aorta 08/17/2017     Medicines Meds ordered this encounter  Medications   sodium chloride  0.9 % bolus 500 mL   potassium chloride  10 mEq in 100 mL IVPB   potassium chloride  SA (KLOR-CON  M) CR tablet 40 mEq   iohexol  (OMNIPAQUE ) 350 MG/ML injection 75 mL   DISCONTD: magnesium  sulfate IVPB 2 g 50 mL   magnesium  sulfate IVPB 1 g 100 mL    I have reviewed the patients home medicines and have made adjustments as needed  Problem List / ED Course: Problem List Items Addressed This Visit   None Visit Diagnoses       Decreased appetite    -  Primary     Hypomagnesemia         Hypokalemia                    Final diagnoses:  Decreased appetite  Hypomagnesemia  Hypokalemia    ED Discharge Orders     None          Lenor Hollering, MD 05/04/24 1704

## 2024-05-04 NOTE — ED Notes (Signed)
 CCMD notified. Pt is on monitor.

## 2024-05-04 NOTE — Discharge Instructions (Signed)
 Make an appointment to have close follow-up with your primary care doctor.  Make sure that you are taking your potassium supplement.  You need to have your potassium and magnesium  rechecked by your primary care doctor.  Try to start taking protein or nutritional shakes.

## 2024-05-28 ENCOUNTER — Ambulatory Visit

## 2024-06-02 ENCOUNTER — Ambulatory Visit

## 2024-06-02 VITALS — BP 138/80 | HR 65 | Temp 97.6°F | Ht 72.0 in | Wt 175.0 lb

## 2024-06-02 DIAGNOSIS — Z23 Encounter for immunization: Secondary | ICD-10-CM

## 2024-06-02 DIAGNOSIS — M069 Rheumatoid arthritis, unspecified: Secondary | ICD-10-CM

## 2024-06-02 DIAGNOSIS — I82432 Acute embolism and thrombosis of left popliteal vein: Secondary | ICD-10-CM

## 2024-06-02 DIAGNOSIS — I2699 Other pulmonary embolism without acute cor pulmonale: Secondary | ICD-10-CM

## 2024-06-02 DIAGNOSIS — J849 Interstitial pulmonary disease, unspecified: Secondary | ICD-10-CM

## 2024-06-02 NOTE — Assessment & Plan Note (Addendum)
 Jerome Sullivan

## 2024-06-02 NOTE — Progress Notes (Signed)
 Pulmonology Office Visit   Subjective:  Patient ID: Jerome Suder., male    DOB: 10-02-1940  MRN: [id number]  Referred by: Fritz Greig DEL, PA-C  CC:  Chief Complaint  Patient presents with   Shortness of Breath    SOB with exertion.    HPI Jerome Soderholm Viren Lebeau. is a 83 y.o. male with Atrial fibrillation, DVT/PE on AC (2021, hypertension, RA on leflunomide and sulfasalazine, ITP, CKD stage III, GERD SCC tongue [excisional surgery with flap repair May 2023], AAA here for DOE eval.   PNA in month of November 2025>Rx of abx.  Follows Rheum>last seen in June 2025. Not been on the meds for >6 weeks.   Respective notes from provider reviewed as appropriate to gather relevant information for patient care.   Discussed the use of AI scribe software for clinical note transcription with the patient, who gave verbal consent to proceed.  History of Present Illness   Jerome Pollack Johann Santone. is an 83 year old male with rheumatoid arthritis who presents with dyspnea on exertion. He was referred by his primary care physician for evaluation of his shortness of breath.  He has been experiencing shortness of breath since September, initially noticed while washing his house. The discomfort is described as being around his chest and arms, which prevented him from raising his arms and caused him to stop the activity. He experiences dyspnea with exertion, such as cutting wood or walking uphill, and notes improvement in his symptoms since the initial onset. He can now walk half a mile to three-quarters of a mile but may need to stop and rest depending on the activity.  He had pneumonia approximately three to four weeks ago, which was diagnosed after multiple visits to the doctor. He was treated with antibiotics for seven to ten days, which improved his symptoms. He reports minimal cough and occasional phlegm production since the pneumonia.  He has a history of rheumatoid arthritis but has not taken his  medications, leflunomide and sulfasalazine, for over six weeks due to issues with prescription refills. He mentions difficulty in contacting his rheumatologist for medication management. He notes that his joints have been doing relatively well despite the lack of medication.  He has a history of tongue cancer, for which he underwent partial tongue resection and flap reconstruction. He reports being able to eat well post-surgery.  He has a history of blood clots in his legs, treated approximately three to four years ago, and is currently on Xarelto  for anticoagulation.  He is a former smoker, having quit over 30 years ago, and does not currently consume alcohol. 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      Lung Health: Functional status: was walking 1/2 mile before sept.  Covid vaccine: not wanting to take it.  Influenza vaccine: UTD.  Pneumonococcal vaccine: PCV 13 in 2015. Need prevnar 20.  Smoking: Ex-smoker, quit >30 years ago, smoking 1 PPD. Occupational exposure/pets: cat and dog. No bird. Used to do carpentery. Did not use mask when was exposed to saw dust exposure.   PRIOR TESTS and IMAGING  CT Chest: CT chest 05/04/2024 reviewed by me: No PE, infrarenal AAA, scattered ground glass changes in perihilar region with some interstitial changes, fibrosis at bilateral lung bases left greater than right, no bronchiectasis seen, no significant emphysematous changes.  No pulmonary nodule seen.  10/25: Scattered multiple  6 mm nodule seen.  ECHO:  03/20/2024: EF 60-65%, mild LVH normal PASP.  Eosinophil 2015: 400.      No data to display          Allergies: Patient has no known allergies.  Current Outpatient Medications:    albuterol (VENTOLIN HFA) 108 (90 Base) MCG/ACT inhaler, Inhale 2 puffs into the lungs every 4 (four) hours as needed., Disp: , Rfl:    amitriptyline (ELAVIL) 25 MG  tablet, Take 12.5 mg by mouth at bedtime., Disp: , Rfl:    atorvastatin (LIPITOR) 80 MG tablet, Take 80 mg by mouth daily., Disp: , Rfl:    calcium carbonate (OS-CAL) 600 MG TABS, Take 600 mg by mouth 2 (two) times daily., Disp: , Rfl:    Cholecalciferol (VITAMIN D-3 PO), Take 1 tablet by mouth daily., Disp: , Rfl:    Coenzyme Q10 (CO Q 10 PO), Take 1 tablet by mouth 2 (two) times daily., Disp: , Rfl:    folic acid (FOLVITE) 1 MG tablet, Take 1 mg by mouth daily., Disp: , Rfl:    labetalol  (NORMODYNE ) 200 MG tablet, Take 1 tablet (200 mg total) by mouth 2 (two) times daily., Disp: 60 tablet, Rfl: 1   leflunomide (ARAVA) 20 MG tablet, Take 20 mg by mouth daily., Disp: , Rfl:    Magnesium  Gluconate (MAGNESIUM  27 PO), Take 1 tablet by mouth daily., Disp: , Rfl:    potassium chloride  SA (KLOR-CON  M) 20 MEQ tablet, Take 1 tablet (20 mEq total) by mouth 2 (two) times daily., Disp: 60 tablet, Rfl: 4   pregabalin (LYRICA) 50 MG capsule, Take 50 mg by mouth 2 (two) times daily., Disp: , Rfl:    sulfaSALAzine (AZULFIDINE) 500 MG tablet, Take 500 mg by mouth 2 (two) times daily., Disp: , Rfl:    XARELTO  20 MG TABS tablet, Take 1 tablet by mouth daily with supper, Disp: 90 tablet, Rfl: 1   Zinc 50 MG TABS, Take 1 tablet by mouth daily., Disp: , Rfl:  Past Medical History:  Diagnosis Date   A-fib (HCC)    A-fib (HCC)    failed cardioversion   Aortic aneurysm 08/17/2017   Aortic dissection distal to left subclavian (HCC) 08/17/2017   Atrial fibrillation (HCC) 09/15/2010   HTN (hypertension)    Hypercholesteremia    Hypertension 09/15/2010   Intramural hematoma of descending thoracic and abdominal aorta 08/17/2017   Past Surgical History:  Procedure Laterality Date   BACK SURGERY     cancer removed from his tongue     CARDIOVERSION     HAND SURGERY     HAND SURGERY Right    LASIK     LUMBAR DISC SURGERY     Family History  Problem Relation Age of Onset   Heart disease Mother    Heart disease  Father    Hypertension Sister    Coronary artery disease Mother    Hypertension Mother    Stroke Mother    Atrial fibrillation Child    Social History   Socioeconomic History   Marital status: Married    Spouse name: Not on file   Number of children: Not on file   Years of education: Not on file   Highest education level: Not on file  Occupational History   Not on file  Tobacco Use   Smoking status: Former    Current packs/day: 0.00    Types: Cigarettes    Quit date: 06/26/1978    Years since quitting: 45.9  Smokeless tobacco: Never  Vaping Use   Vaping status: Never Used  Substance and Sexual Activity   Alcohol use: No   Drug use: No   Sexual activity: Yes    Partners: Female    Birth control/protection: None  Other Topics Concern   Not on file  Social History Narrative   ** Merged History Encounter **       Social Drivers of Health   Financial Resource Strain: Low Risk  (08/17/2017)   Overall Financial Resource Strain (CARDIA)    Difficulty of Paying Living Expenses: Not very hard  Food Insecurity: No Food Insecurity (08/17/2017)   Hunger Vital Sign    Worried About Running Out of Food in the Last Year: Never true    Ran Out of Food in the Last Year: Never true  Transportation Needs: No Transportation Needs (08/17/2017)   PRAPARE - Administrator, Civil Service (Medical): No    Lack of Transportation (Non-Medical): No  Physical Activity: Inactive (08/17/2017)   Exercise Vital Sign    Days of Exercise per Week: 0 days    Minutes of Exercise per Session: 0 min  Stress: No Stress Concern Present (08/17/2017)   [employer] of Occupational Health - Occupational Stress Questionnaire    Feeling of Stress : Only a little  Social Connections: Unknown (08/17/2017)   Social Connection and Isolation Panel    Frequency of Communication with Friends and Family: Patient declined    Frequency of Social Gatherings with Friends and Family: Patient declined     Attends Religious Services: Patient declined    Database Administrator or Organizations: Patient declined    Attends Banker Meetings: Patient declined    Marital Status: Patient declined  Intimate Partner Violence: Not At Risk (08/17/2017)   Humiliation, Afraid, Rape, and Kick questionnaire    Fear of Current or Ex-Partner: No    Emotionally Abused: No    Physically Abused: No    Sexually Abused: No       Objective:  BP 138/80   Pulse 65   Temp 97.6 F (36.4 C) (Oral)   Ht 6' (1.829 m)   Wt 175 lb (79.4 kg)   SpO2 99% Comment: room air  BMI 23.73 kg/m  BMI Readings from Last 3 Encounters:  06/02/24 23.73 kg/m  02/22/24 23.65 kg/m  08/17/23 23.92 kg/m    Physical Exam: Physical Exam   ENT: Right half of tongue removed, flap placed. Normal mucosa. No hypertrophy of inferior turbinates. Tonsils are normal sized. Modified Mallampati score is normal. PULMONARY: Lungs clear to auscultation bilaterally, no adventitious breath sounds. CARDIOVASCULAR: Regular rate and rhythm, S1 S2 normal, no murmurs. ABDOMEN: Abdomen soft, nontender. Bowel sounds are normal. EXTREMITIES: No peripheral edema noted.       Diagnostic Review:  Last metabolic panel Lab Results  Component Value Date   GLUCOSE 104 (H) 05/04/2024   NA 135 05/04/2024   K 2.9 (L) 05/04/2024   CL 95 (L) 05/04/2024   CO2 26 05/04/2024   BUN 24 (H) 05/04/2024   CREATININE 1.24 05/04/2024   GFRNONAA 58 (L) 05/04/2024   CALCIUM 8.8 (L) 05/04/2024   PHOS 4.4 08/18/2017   PROT 5.5 (L) 05/04/2024   ALBUMIN 2.4 (L) 05/04/2024   LABGLOB 2.5 09/04/2016   AGRATIO 1.6 09/04/2016   BILITOT 1.1 05/04/2024   ALKPHOS 37 (L) 05/04/2024   AST 24 05/04/2024   ALT 19 05/04/2024   ANIONGAP 14 05/04/2024  Assessment & Plan:   Assessment & Plan ILD (interstitial lung disease) (HCC)  Orders:   Pulmonary Function Test; Future  Deep vein thrombosis (DVT) of popliteal vein of left lower extremity,  unspecified chronicity (HCC)     Other pulmonary embolism without acute cor pulmonale, unspecified chronicity (HCC)     Rheumatoid arthritis, involving unspecified site, unspecified whether rheumatoid factor present (HCC)     Pneumococcal vaccine administered Will give prevnar 20.        Assessment and Plan    Interstitial lung disease CT scan indicates interstitial lung disease with basal changes. Shortness of breath may be linked to interstitial lung disease or recent pneumonia. - Ordered lung function test to assess capacity and function. - Will evaluate lung function test results to determine treatment options.  Rheumatoid arthritis with possible pulmonary involvement Rheumatoid arthritis may contribute to interstitial lung changes. He has not been on medication for six weeks due to pharmacy and rheumatologist communication issues. - Encouraged follow-up with rheumatologist to resume medication. - Discussed potential impact of rheumatoid arthritis on lung function.   PE/DVT: - cont xarelto .  - ECHO w/o PHTN.    Pulmonary nodules: - resolved.         Notes from 04/18/24 PCP reviewed as to gather relevant information for patient care and formulating plan.   Return for sch after PFTs.   I personally spent a total of 30 minutes in the care of the patient today including preparing to see the patient, getting/reviewing separately obtained history, performing a medically appropriate exam/evaluation, counseling and educating, placing orders, documenting clinical information in the EHR, independently interpreting results, and communicating results.   Faith Branan, MD

## 2024-06-02 NOTE — Patient Instructions (Signed)
  VISIT SUMMARY:  You came in today because of shortness of breath that you've been experiencing since September, which has been affecting your daily activities. You also mentioned a recent history of pneumonia and issues with your rheumatoid arthritis medication. We discussed your symptoms and reviewed your recent CT scan results.  YOUR PLAN:  -INTERSTITIAL LUNG DISEASE: Interstitial lung disease is a group of disorders that cause scarring of the lungs, which can lead to breathing problems. Your CT scan showed changes in your lungs that may be related to this condition. We have ordered a lung function test to better understand your lung capacity and function. Once we have the results, we will discuss the best treatment options for you.  -RHEUMATOID ARTHRITIS WITH POSSIBLE PULMONARY INVOLVEMENT: Rheumatoid arthritis is an autoimmune disease that causes inflammation in your joints and can sometimes affect your lungs. Since you have not been taking your rheumatoid arthritis medications for six weeks, this might be contributing to your lung issues. We recommend that you follow up with your rheumatologist to resume your medication. We also discussed how rheumatoid arthritis could be impacting your lung function.                      Contains text generated by [employer]bridge.                                 Contains text generated by Abridge.

## 2024-06-02 NOTE — Addendum Note (Signed)
 Addended by: Lynsay Fesperman M on: 06/02/2024 10:48 AM   Modules accepted: Orders

## 2024-06-02 NOTE — Assessment & Plan Note (Addendum)
 SABRA

## 2024-06-18 ENCOUNTER — Ambulatory Visit (HOSPITAL_BASED_OUTPATIENT_CLINIC_OR_DEPARTMENT_OTHER)

## 2024-06-18 DIAGNOSIS — J849 Interstitial pulmonary disease, unspecified: Secondary | ICD-10-CM

## 2024-06-18 LAB — PULMONARY FUNCTION TEST
DL/VA % pred: 88 %
DL/VA: 3.41 ml/min/mmHg/L
DLCO cor % pred: 71 %
DLCO cor: 18.53 ml/min/mmHg
DLCO unc % pred: 65 %
DLCO unc: 16.95 ml/min/mmHg
FEF 25-75 Post: 3.2 L/s
FEF 25-75 Pre: 2.97 L/s
FEF2575-%Change-Post: 7 %
FEF2575-%Pred-Post: 151 %
FEF2575-%Pred-Pre: 140 %
FEV1-%Change-Post: 2 %
FEV1-%Pred-Post: 102 %
FEV1-%Pred-Pre: 100 %
FEV1-Post: 3.17 L
FEV1-Pre: 3.09 L
FEV1FVC-%Change-Post: 0 %
FEV1FVC-%Pred-Pre: 112 %
FEV6-%Change-Post: 2 %
FEV6-%Pred-Post: 97 %
FEV6-%Pred-Pre: 95 %
FEV6-Post: 3.94 L
FEV6-Pre: 3.85 L
FEV6FVC-%Change-Post: 0 %
FEV6FVC-%Pred-Post: 106 %
FEV6FVC-%Pred-Pre: 106 %
FVC-%Change-Post: 1 %
FVC-%Pred-Post: 90 %
FVC-%Pred-Pre: 89 %
FVC-Post: 3.94 L
FVC-Pre: 3.87 L
Post FEV1/FVC ratio: 81 %
Post FEV6/FVC ratio: 100 %
Pre FEV1/FVC ratio: 80 %
Pre FEV6/FVC Ratio: 100 %
RV % pred: 108 %
RV: 3.02 L
TLC % pred: 89 %
TLC: 6.68 L

## 2024-06-18 NOTE — Progress Notes (Signed)
 Full PFT performed today.

## 2024-06-18 NOTE — Patient Instructions (Signed)
 Full PFT performed today.

## 2024-06-30 ENCOUNTER — Ambulatory Visit: Payer: Self-pay

## 2024-07-02 NOTE — Progress Notes (Signed)
 ATC x1. LVMTCB

## 2024-07-04 ENCOUNTER — Telehealth: Payer: Self-pay

## 2024-07-04 NOTE — Telephone Encounter (Signed)
 Copied from CRM #[id number]. Topic: Appointments - Scheduling Inquiry for Clinic >> Jul 02, 2024 10:15 AM Leila C wrote: Reason for CRM: Patient's spouse Ronal [phone] is returning the office and trying to find out what the call was for. Patient will have new insurance [employer] starting 07/27/24. Informed Mary, a follow up appointment with Dr. Pawar is needed. Next available appointment with Dr. Theodoro is 09/18/24. Unable to reach CAL, tried a few times. Please advise and call back.    ----------------------------------------------------------------------- From previous Reason for Contact - Scheduling: Patient/patient representative is calling to schedule an appointment. Refer to attachments for appointment information.    Vm /Lm - return call looks like they were trying to go over results see phone note 1/5   And schedule f/u

## 2024-07-07 NOTE — Progress Notes (Signed)
 ATCx2 LVMTCB. Routing to Admin to get pt scheduled, as I have been unable to reach him and pt is not recently active on MyChart.

## 2024-07-14 NOTE — Telephone Encounter (Signed)
 Copied from CRM [id number]. Topic: General - Other >> Jul 14, 2024 12:56 PM Corean SAUNDERS wrote: Reason for CRM: Patients son Norleen states the patient is currently in the hospital due to an aneurysm and seizures and states he will not be able tos schedule any lung test etc at this time as there have been missed calls from [facility] pulmonary.   For more questions please contact patients wife Ronal at [phone]

## 2024-07-18 ENCOUNTER — Telehealth: Payer: Self-pay | Admitting: *Deleted

## 2024-07-18 NOTE — Telephone Encounter (Signed)
 Wife made aware of PFT results. Copied from CRM #[id number]. Topic: Clinical - Lab/Test Results >> Jul 16, 2024 11:53 AM Dedra NOVAK wrote: Reason for CRM: Patient's wife, Ronal, returning call regarding patient's test results. She said he is currently in the hospital.   Theodoro [place], MD to [facility].     06/30/24 11:31 PM Hi Mr Sardis. Your lung function test has very minor abnormality called reduction in DLCO. This can be a sequelae of your lung clot. We can discuss this during our next visit.   This MyChart message has not been read. Pulmonary Function Test

## 2024-08-22 ENCOUNTER — Ambulatory Visit: Admitting: Cardiology
